# Patient Record
Sex: Female | Born: 1965 | Race: White | Hispanic: No | Marital: Married | Smoking: Never smoker
Health system: Southern US, Community
[De-identification: ages and names within clinical notes are randomized; demographics above are authoritative.]

## PROBLEM LIST (undated history)

## (undated) ENCOUNTER — Inpatient Hospital Stay (HOSPITAL_COMMUNITY): Payer: Self-pay

## (undated) DIAGNOSIS — IMO0002 Reserved for concepts with insufficient information to code with codable children: Secondary | ICD-10-CM

## (undated) DIAGNOSIS — I471 Supraventricular tachycardia, unspecified: Secondary | ICD-10-CM

## (undated) DIAGNOSIS — R0609 Other forms of dyspnea: Secondary | ICD-10-CM

## (undated) DIAGNOSIS — E785 Hyperlipidemia, unspecified: Secondary | ICD-10-CM

## (undated) DIAGNOSIS — I10 Essential (primary) hypertension: Secondary | ICD-10-CM

## (undated) DIAGNOSIS — N739 Female pelvic inflammatory disease, unspecified: Secondary | ICD-10-CM

## (undated) DIAGNOSIS — D869 Sarcoidosis, unspecified: Secondary | ICD-10-CM

## (undated) DIAGNOSIS — O24419 Gestational diabetes mellitus in pregnancy, unspecified control: Secondary | ICD-10-CM

## (undated) DIAGNOSIS — N390 Urinary tract infection, site not specified: Secondary | ICD-10-CM

## (undated) DIAGNOSIS — R87619 Unspecified abnormal cytological findings in specimens from cervix uteri: Secondary | ICD-10-CM

## (undated) HISTORY — DX: Other forms of dyspnea: R06.09

## (undated) HISTORY — PX: INDUCED ABORTION: SHX677

## (undated) HISTORY — PX: APPENDECTOMY: SHX54

## (undated) HISTORY — DX: Supraventricular tachycardia, unspecified: I47.10

---

## 1998-05-23 ENCOUNTER — Encounter: Admission: RE | Admit: 1998-05-23 | Discharge: 1998-05-23 | Payer: Self-pay | Admitting: Family Medicine

## 1998-06-20 ENCOUNTER — Encounter: Admission: RE | Admit: 1998-06-20 | Discharge: 1998-06-20 | Payer: Self-pay | Admitting: Family Medicine

## 1998-08-09 ENCOUNTER — Inpatient Hospital Stay (HOSPITAL_COMMUNITY): Admission: AD | Admit: 1998-08-09 | Discharge: 1998-08-09 | Payer: Self-pay | Admitting: *Deleted

## 1998-08-10 ENCOUNTER — Ambulatory Visit (HOSPITAL_COMMUNITY): Admission: RE | Admit: 1998-08-10 | Discharge: 1998-08-10 | Payer: Self-pay | Admitting: *Deleted

## 1998-10-23 ENCOUNTER — Ambulatory Visit (HOSPITAL_COMMUNITY): Admission: RE | Admit: 1998-10-23 | Discharge: 1998-10-23 | Payer: Self-pay | Admitting: Obstetrics & Gynecology

## 1998-12-26 ENCOUNTER — Inpatient Hospital Stay (HOSPITAL_COMMUNITY): Admission: AD | Admit: 1998-12-26 | Discharge: 1998-12-26 | Payer: Self-pay | Admitting: *Deleted

## 1998-12-27 ENCOUNTER — Inpatient Hospital Stay (HOSPITAL_COMMUNITY): Admission: AD | Admit: 1998-12-27 | Discharge: 1998-12-27 | Payer: Self-pay | Admitting: Obstetrics

## 1999-01-01 ENCOUNTER — Inpatient Hospital Stay (HOSPITAL_COMMUNITY): Admission: AD | Admit: 1999-01-01 | Discharge: 1999-01-01 | Payer: Self-pay | Admitting: Obstetrics

## 1999-01-03 ENCOUNTER — Inpatient Hospital Stay (HOSPITAL_COMMUNITY): Admission: AD | Admit: 1999-01-03 | Discharge: 1999-01-03 | Payer: Self-pay | Admitting: *Deleted

## 1999-01-16 ENCOUNTER — Ambulatory Visit (HOSPITAL_COMMUNITY): Admission: RE | Admit: 1999-01-16 | Discharge: 1999-01-16 | Payer: Self-pay | Admitting: *Deleted

## 1999-01-22 ENCOUNTER — Inpatient Hospital Stay (HOSPITAL_COMMUNITY): Admission: AD | Admit: 1999-01-22 | Discharge: 1999-01-22 | Payer: Self-pay | Admitting: Obstetrics

## 1999-01-23 ENCOUNTER — Inpatient Hospital Stay (HOSPITAL_COMMUNITY): Admission: AD | Admit: 1999-01-23 | Discharge: 1999-01-23 | Payer: Self-pay | Admitting: Obstetrics & Gynecology

## 1999-01-25 ENCOUNTER — Observation Stay (HOSPITAL_COMMUNITY): Admission: AD | Admit: 1999-01-25 | Discharge: 1999-01-25 | Payer: Self-pay | Admitting: Obstetrics & Gynecology

## 1999-01-29 ENCOUNTER — Inpatient Hospital Stay (HOSPITAL_COMMUNITY): Admission: AD | Admit: 1999-01-29 | Discharge: 1999-01-29 | Payer: Self-pay | Admitting: *Deleted

## 1999-02-03 ENCOUNTER — Inpatient Hospital Stay (HOSPITAL_COMMUNITY): Admission: AD | Admit: 1999-02-03 | Discharge: 1999-02-05 | Payer: Self-pay | Admitting: Obstetrics

## 1999-03-04 ENCOUNTER — Inpatient Hospital Stay (HOSPITAL_COMMUNITY): Admission: AD | Admit: 1999-03-04 | Discharge: 1999-03-04 | Payer: Self-pay | Admitting: *Deleted

## 1999-04-08 ENCOUNTER — Encounter: Admission: RE | Admit: 1999-04-08 | Discharge: 1999-04-08 | Payer: Self-pay | Admitting: Family Medicine

## 1999-04-10 ENCOUNTER — Encounter: Admission: RE | Admit: 1999-04-10 | Discharge: 1999-04-10 | Payer: Self-pay | Admitting: Family Medicine

## 1999-04-24 ENCOUNTER — Encounter: Admission: RE | Admit: 1999-04-24 | Discharge: 1999-04-24 | Payer: Self-pay | Admitting: Family Medicine

## 1999-06-02 ENCOUNTER — Encounter: Admission: RE | Admit: 1999-06-02 | Discharge: 1999-06-02 | Payer: Self-pay | Admitting: Family Medicine

## 1999-08-21 ENCOUNTER — Encounter: Admission: RE | Admit: 1999-08-21 | Discharge: 1999-08-21 | Payer: Self-pay | Admitting: Family Medicine

## 2000-03-30 ENCOUNTER — Encounter: Admission: RE | Admit: 2000-03-30 | Discharge: 2000-03-30 | Payer: Self-pay | Admitting: Family Medicine

## 2000-04-20 ENCOUNTER — Encounter: Admission: RE | Admit: 2000-04-20 | Discharge: 2000-04-20 | Payer: Self-pay | Admitting: Family Medicine

## 2000-05-07 ENCOUNTER — Ambulatory Visit (HOSPITAL_COMMUNITY): Admission: RE | Admit: 2000-05-07 | Discharge: 2000-05-07 | Payer: Self-pay | Admitting: *Deleted

## 2000-06-08 ENCOUNTER — Ambulatory Visit (HOSPITAL_COMMUNITY): Admission: RE | Admit: 2000-06-08 | Discharge: 2000-06-08 | Payer: Self-pay | Admitting: *Deleted

## 2000-07-05 ENCOUNTER — Inpatient Hospital Stay (HOSPITAL_COMMUNITY): Admission: AD | Admit: 2000-07-05 | Discharge: 2000-07-05 | Payer: Self-pay | Admitting: Obstetrics

## 2000-07-19 ENCOUNTER — Ambulatory Visit (HOSPITAL_COMMUNITY): Admission: RE | Admit: 2000-07-19 | Discharge: 2000-07-19 | Payer: Self-pay | Admitting: *Deleted

## 2000-08-05 ENCOUNTER — Inpatient Hospital Stay (HOSPITAL_COMMUNITY): Admission: AD | Admit: 2000-08-05 | Discharge: 2000-08-05 | Payer: Self-pay | Admitting: *Deleted

## 2000-08-07 ENCOUNTER — Observation Stay (HOSPITAL_COMMUNITY): Admission: AD | Admit: 2000-08-07 | Discharge: 2000-08-08 | Payer: Self-pay | Admitting: *Deleted

## 2000-08-12 ENCOUNTER — Inpatient Hospital Stay (HOSPITAL_COMMUNITY): Admission: AD | Admit: 2000-08-12 | Discharge: 2000-08-12 | Payer: Self-pay | Admitting: Obstetrics & Gynecology

## 2000-08-25 ENCOUNTER — Inpatient Hospital Stay (HOSPITAL_COMMUNITY): Admission: AD | Admit: 2000-08-25 | Discharge: 2000-08-25 | Payer: Self-pay | Admitting: Obstetrics

## 2000-09-01 ENCOUNTER — Encounter: Payer: Self-pay | Admitting: Obstetrics

## 2000-09-01 ENCOUNTER — Inpatient Hospital Stay (HOSPITAL_COMMUNITY): Admission: AD | Admit: 2000-09-01 | Discharge: 2000-09-01 | Payer: Self-pay | Admitting: Obstetrics

## 2000-09-15 ENCOUNTER — Inpatient Hospital Stay (HOSPITAL_COMMUNITY): Admission: AD | Admit: 2000-09-15 | Discharge: 2000-09-15 | Payer: Self-pay | Admitting: Obstetrics

## 2000-09-17 ENCOUNTER — Inpatient Hospital Stay (HOSPITAL_COMMUNITY): Admission: AD | Admit: 2000-09-17 | Discharge: 2000-09-20 | Payer: Self-pay | Admitting: Obstetrics & Gynecology

## 2000-09-22 ENCOUNTER — Inpatient Hospital Stay (HOSPITAL_COMMUNITY): Admission: AD | Admit: 2000-09-22 | Discharge: 2000-09-22 | Payer: Self-pay | Admitting: Obstetrics

## 2000-10-05 ENCOUNTER — Inpatient Hospital Stay (HOSPITAL_COMMUNITY): Admission: AD | Admit: 2000-10-05 | Discharge: 2000-10-05 | Payer: Self-pay | Admitting: *Deleted

## 2000-10-18 ENCOUNTER — Inpatient Hospital Stay (HOSPITAL_COMMUNITY): Admission: AD | Admit: 2000-10-18 | Discharge: 2000-10-18 | Payer: Self-pay | Admitting: Obstetrics

## 2000-10-21 ENCOUNTER — Inpatient Hospital Stay (HOSPITAL_COMMUNITY): Admission: AD | Admit: 2000-10-21 | Discharge: 2000-10-23 | Payer: Self-pay | Admitting: Obstetrics & Gynecology

## 2000-11-30 ENCOUNTER — Encounter: Admission: RE | Admit: 2000-11-30 | Discharge: 2000-11-30 | Payer: Self-pay | Admitting: Sports Medicine

## 2000-12-23 ENCOUNTER — Encounter: Admission: RE | Admit: 2000-12-23 | Discharge: 2000-12-23 | Payer: Self-pay | Admitting: Family Medicine

## 2001-05-03 ENCOUNTER — Encounter: Admission: RE | Admit: 2001-05-03 | Discharge: 2001-05-03 | Payer: Self-pay | Admitting: Family Medicine

## 2001-05-09 ENCOUNTER — Encounter: Admission: RE | Admit: 2001-05-09 | Discharge: 2001-05-09 | Payer: Self-pay | Admitting: Family Medicine

## 2001-09-20 ENCOUNTER — Encounter: Admission: RE | Admit: 2001-09-20 | Discharge: 2001-09-20 | Payer: Self-pay | Admitting: Family Medicine

## 2002-02-15 ENCOUNTER — Encounter: Admission: RE | Admit: 2002-02-15 | Discharge: 2002-02-15 | Payer: Self-pay | Admitting: Family Medicine

## 2002-05-02 ENCOUNTER — Encounter: Admission: RE | Admit: 2002-05-02 | Discharge: 2002-05-02 | Payer: Self-pay | Admitting: Family Medicine

## 2002-11-21 ENCOUNTER — Encounter: Admission: RE | Admit: 2002-11-21 | Discharge: 2002-11-21 | Payer: Self-pay | Admitting: Family Medicine

## 2002-11-28 ENCOUNTER — Encounter: Admission: RE | Admit: 2002-11-28 | Discharge: 2002-11-28 | Payer: Self-pay | Admitting: Family Medicine

## 2002-12-01 ENCOUNTER — Encounter: Admission: RE | Admit: 2002-12-01 | Discharge: 2002-12-01 | Payer: Self-pay | Admitting: Family Medicine

## 2002-12-07 ENCOUNTER — Encounter: Admission: RE | Admit: 2002-12-07 | Discharge: 2002-12-07 | Payer: Self-pay | Admitting: Family Medicine

## 2003-01-02 ENCOUNTER — Encounter (INDEPENDENT_AMBULATORY_CARE_PROVIDER_SITE_OTHER): Payer: Self-pay | Admitting: *Deleted

## 2003-01-02 LAB — CONVERTED CEMR LAB

## 2003-01-23 ENCOUNTER — Encounter: Admission: RE | Admit: 2003-01-23 | Discharge: 2003-01-23 | Payer: Self-pay | Admitting: Family Medicine

## 2003-01-26 ENCOUNTER — Encounter: Admission: RE | Admit: 2003-01-26 | Discharge: 2003-01-26 | Payer: Self-pay | Admitting: Family Medicine

## 2003-02-11 ENCOUNTER — Encounter: Payer: Self-pay | Admitting: Emergency Medicine

## 2003-02-12 ENCOUNTER — Encounter: Payer: Self-pay | Admitting: Emergency Medicine

## 2003-02-12 ENCOUNTER — Observation Stay (HOSPITAL_COMMUNITY): Admission: EM | Admit: 2003-02-12 | Discharge: 2003-02-12 | Payer: Self-pay | Admitting: Emergency Medicine

## 2003-03-01 ENCOUNTER — Encounter: Admission: RE | Admit: 2003-03-01 | Discharge: 2003-03-01 | Payer: Self-pay | Admitting: Family Medicine

## 2003-03-02 ENCOUNTER — Encounter: Admission: RE | Admit: 2003-03-02 | Discharge: 2003-03-02 | Payer: Self-pay | Admitting: Family Medicine

## 2003-03-02 ENCOUNTER — Encounter: Payer: Self-pay | Admitting: Family Medicine

## 2003-05-17 ENCOUNTER — Encounter: Admission: RE | Admit: 2003-05-17 | Discharge: 2003-05-17 | Payer: Self-pay | Admitting: Family Medicine

## 2003-07-30 ENCOUNTER — Encounter: Admission: RE | Admit: 2003-07-30 | Discharge: 2003-07-30 | Payer: Self-pay | Admitting: Family Medicine

## 2003-08-02 ENCOUNTER — Encounter: Admission: RE | Admit: 2003-08-02 | Discharge: 2003-08-02 | Payer: Self-pay | Admitting: Sports Medicine

## 2003-10-09 ENCOUNTER — Encounter: Admission: RE | Admit: 2003-10-09 | Discharge: 2003-10-09 | Payer: Self-pay | Admitting: Family Medicine

## 2003-10-12 ENCOUNTER — Encounter: Admission: RE | Admit: 2003-10-12 | Discharge: 2003-10-12 | Payer: Self-pay | Admitting: Family Medicine

## 2003-10-12 ENCOUNTER — Ambulatory Visit (HOSPITAL_COMMUNITY): Admission: RE | Admit: 2003-10-12 | Discharge: 2003-10-12 | Payer: Self-pay | Admitting: Family Medicine

## 2003-10-23 ENCOUNTER — Encounter: Admission: RE | Admit: 2003-10-23 | Discharge: 2003-10-23 | Payer: Self-pay | Admitting: Sports Medicine

## 2003-11-06 ENCOUNTER — Encounter: Admission: RE | Admit: 2003-11-06 | Discharge: 2003-11-06 | Payer: Self-pay | Admitting: Family Medicine

## 2004-01-16 ENCOUNTER — Encounter: Admission: RE | Admit: 2004-01-16 | Discharge: 2004-01-16 | Payer: Self-pay | Admitting: Family Medicine

## 2004-01-17 ENCOUNTER — Emergency Department (HOSPITAL_COMMUNITY): Admission: EM | Admit: 2004-01-17 | Discharge: 2004-01-17 | Payer: Self-pay | Admitting: Emergency Medicine

## 2004-01-22 ENCOUNTER — Encounter: Admission: RE | Admit: 2004-01-22 | Discharge: 2004-01-22 | Payer: Self-pay | Admitting: Family Medicine

## 2004-02-07 ENCOUNTER — Encounter: Admission: RE | Admit: 2004-02-07 | Discharge: 2004-02-07 | Payer: Self-pay | Admitting: Family Medicine

## 2004-02-11 ENCOUNTER — Emergency Department (HOSPITAL_COMMUNITY): Admission: EM | Admit: 2004-02-11 | Discharge: 2004-02-12 | Payer: Self-pay

## 2004-02-12 ENCOUNTER — Encounter: Admission: RE | Admit: 2004-02-12 | Discharge: 2004-02-12 | Payer: Self-pay | Admitting: Family Medicine

## 2004-04-02 ENCOUNTER — Encounter: Admission: RE | Admit: 2004-04-02 | Discharge: 2004-04-02 | Payer: Self-pay | Admitting: Sports Medicine

## 2004-04-08 ENCOUNTER — Encounter: Admission: RE | Admit: 2004-04-08 | Discharge: 2004-04-08 | Payer: Self-pay | Admitting: Family Medicine

## 2004-04-15 ENCOUNTER — Encounter: Admission: RE | Admit: 2004-04-15 | Discharge: 2004-04-15 | Payer: Self-pay | Admitting: Family Medicine

## 2004-04-28 ENCOUNTER — Encounter: Admission: RE | Admit: 2004-04-28 | Discharge: 2004-04-28 | Payer: Self-pay | Admitting: Family Medicine

## 2004-05-02 ENCOUNTER — Ambulatory Visit: Payer: Self-pay | Admitting: Family Medicine

## 2004-05-13 ENCOUNTER — Ambulatory Visit: Payer: Self-pay | Admitting: Family Medicine

## 2004-05-21 ENCOUNTER — Ambulatory Visit: Payer: Self-pay | Admitting: Family Medicine

## 2004-05-23 ENCOUNTER — Ambulatory Visit: Payer: Self-pay | Admitting: *Deleted

## 2004-05-23 ENCOUNTER — Inpatient Hospital Stay (HOSPITAL_COMMUNITY): Admission: AD | Admit: 2004-05-23 | Discharge: 2004-05-23 | Payer: Self-pay | Admitting: Obstetrics & Gynecology

## 2004-05-26 ENCOUNTER — Ambulatory Visit: Payer: Self-pay | Admitting: Family Medicine

## 2004-05-30 ENCOUNTER — Ambulatory Visit: Payer: Self-pay | Admitting: Family Medicine

## 2004-06-03 ENCOUNTER — Ambulatory Visit (HOSPITAL_COMMUNITY): Admission: RE | Admit: 2004-06-03 | Discharge: 2004-06-03 | Payer: Self-pay | Admitting: Family Medicine

## 2004-06-04 ENCOUNTER — Emergency Department (HOSPITAL_COMMUNITY): Admission: EM | Admit: 2004-06-04 | Discharge: 2004-06-05 | Payer: Self-pay | Admitting: Emergency Medicine

## 2004-06-05 ENCOUNTER — Ambulatory Visit: Payer: Self-pay | Admitting: Family Medicine

## 2004-06-20 ENCOUNTER — Ambulatory Visit (HOSPITAL_COMMUNITY): Admission: RE | Admit: 2004-06-20 | Discharge: 2004-06-20 | Payer: Self-pay | Admitting: Family Medicine

## 2004-06-20 ENCOUNTER — Ambulatory Visit: Payer: Self-pay | Admitting: Sports Medicine

## 2004-06-27 ENCOUNTER — Inpatient Hospital Stay (HOSPITAL_COMMUNITY): Admission: AD | Admit: 2004-06-27 | Discharge: 2004-06-27 | Payer: Self-pay | Admitting: Obstetrics & Gynecology

## 2004-06-30 ENCOUNTER — Inpatient Hospital Stay (HOSPITAL_COMMUNITY): Admission: AD | Admit: 2004-06-30 | Discharge: 2004-06-30 | Payer: Self-pay | Admitting: Obstetrics and Gynecology

## 2004-07-02 ENCOUNTER — Observation Stay: Payer: Self-pay

## 2004-07-03 ENCOUNTER — Inpatient Hospital Stay (HOSPITAL_COMMUNITY): Admission: AD | Admit: 2004-07-03 | Discharge: 2004-07-03 | Payer: Self-pay | Admitting: Obstetrics and Gynecology

## 2004-07-07 ENCOUNTER — Ambulatory Visit: Payer: Self-pay | Admitting: Sports Medicine

## 2004-07-22 ENCOUNTER — Ambulatory Visit: Payer: Self-pay | Admitting: Family Medicine

## 2004-07-29 ENCOUNTER — Ambulatory Visit: Payer: Self-pay | Admitting: Family Medicine

## 2004-07-29 ENCOUNTER — Ambulatory Visit (HOSPITAL_COMMUNITY): Admission: RE | Admit: 2004-07-29 | Discharge: 2004-07-29 | Payer: Self-pay | Admitting: Family Medicine

## 2004-08-04 ENCOUNTER — Inpatient Hospital Stay (HOSPITAL_COMMUNITY): Admission: AD | Admit: 2004-08-04 | Discharge: 2004-08-04 | Payer: Self-pay | Admitting: Obstetrics & Gynecology

## 2004-08-08 ENCOUNTER — Ambulatory Visit: Payer: Self-pay | Admitting: Sports Medicine

## 2004-08-08 ENCOUNTER — Inpatient Hospital Stay (HOSPITAL_COMMUNITY): Admission: AD | Admit: 2004-08-08 | Discharge: 2004-08-08 | Payer: Self-pay | Admitting: Obstetrics & Gynecology

## 2004-08-11 ENCOUNTER — Ambulatory Visit: Payer: Self-pay | Admitting: Family Medicine

## 2004-08-12 ENCOUNTER — Ambulatory Visit: Payer: Self-pay | Admitting: Family Medicine

## 2004-08-14 ENCOUNTER — Observation Stay: Payer: Self-pay

## 2004-08-21 ENCOUNTER — Ambulatory Visit: Payer: Self-pay | Admitting: Family Medicine

## 2004-08-27 ENCOUNTER — Ambulatory Visit: Payer: Self-pay | Admitting: Sports Medicine

## 2004-09-03 ENCOUNTER — Inpatient Hospital Stay (HOSPITAL_COMMUNITY): Admission: AD | Admit: 2004-09-03 | Discharge: 2004-09-03 | Payer: Self-pay | Admitting: Obstetrics and Gynecology

## 2004-09-10 ENCOUNTER — Ambulatory Visit: Payer: Self-pay | Admitting: Family Medicine

## 2004-09-25 ENCOUNTER — Ambulatory Visit: Payer: Self-pay | Admitting: Sports Medicine

## 2004-09-29 ENCOUNTER — Observation Stay: Payer: Self-pay | Admitting: Obstetrics and Gynecology

## 2004-10-03 ENCOUNTER — Ambulatory Visit: Payer: Self-pay | Admitting: Obstetrics & Gynecology

## 2004-10-03 ENCOUNTER — Observation Stay (HOSPITAL_COMMUNITY): Admission: AD | Admit: 2004-10-03 | Discharge: 2004-10-04 | Payer: Self-pay | Admitting: Obstetrics & Gynecology

## 2004-10-13 ENCOUNTER — Ambulatory Visit (HOSPITAL_COMMUNITY): Admission: RE | Admit: 2004-10-13 | Discharge: 2004-10-13 | Payer: Self-pay | Admitting: Sports Medicine

## 2004-10-13 ENCOUNTER — Ambulatory Visit: Payer: Self-pay | Admitting: Sports Medicine

## 2004-10-21 ENCOUNTER — Ambulatory Visit: Payer: Self-pay | Admitting: Family Medicine

## 2004-10-25 ENCOUNTER — Inpatient Hospital Stay (HOSPITAL_COMMUNITY): Admission: AD | Admit: 2004-10-25 | Discharge: 2004-10-26 | Payer: Self-pay | Admitting: *Deleted

## 2004-10-28 ENCOUNTER — Ambulatory Visit: Payer: Self-pay | Admitting: Family Medicine

## 2004-10-30 ENCOUNTER — Observation Stay: Payer: Self-pay | Admitting: Obstetrics and Gynecology

## 2004-11-05 ENCOUNTER — Inpatient Hospital Stay (HOSPITAL_COMMUNITY): Admission: AD | Admit: 2004-11-05 | Discharge: 2004-11-05 | Payer: Self-pay | Admitting: Obstetrics and Gynecology

## 2004-11-06 ENCOUNTER — Ambulatory Visit: Payer: Self-pay | Admitting: Sports Medicine

## 2004-11-10 ENCOUNTER — Observation Stay: Payer: Self-pay | Admitting: Obstetrics and Gynecology

## 2004-11-11 ENCOUNTER — Inpatient Hospital Stay (HOSPITAL_COMMUNITY): Admission: AD | Admit: 2004-11-11 | Discharge: 2004-11-12 | Payer: Self-pay | Admitting: *Deleted

## 2004-11-13 ENCOUNTER — Ambulatory Visit: Payer: Self-pay | Admitting: Family Medicine

## 2004-11-14 ENCOUNTER — Inpatient Hospital Stay (HOSPITAL_COMMUNITY): Admission: AD | Admit: 2004-11-14 | Discharge: 2004-11-15 | Payer: Self-pay | Admitting: Obstetrics & Gynecology

## 2004-11-18 ENCOUNTER — Ambulatory Visit: Payer: Self-pay | Admitting: *Deleted

## 2004-11-19 ENCOUNTER — Inpatient Hospital Stay (HOSPITAL_COMMUNITY): Admission: AD | Admit: 2004-11-19 | Discharge: 2004-11-19 | Payer: Self-pay | Admitting: Obstetrics and Gynecology

## 2004-11-21 ENCOUNTER — Ambulatory Visit: Payer: Self-pay | Admitting: Family Medicine

## 2004-11-24 ENCOUNTER — Inpatient Hospital Stay (HOSPITAL_COMMUNITY): Admission: AD | Admit: 2004-11-24 | Discharge: 2004-11-26 | Payer: Self-pay | Admitting: Family Medicine

## 2004-11-27 ENCOUNTER — Ambulatory Visit: Payer: Self-pay | Admitting: Certified Nurse Midwife

## 2004-11-27 ENCOUNTER — Inpatient Hospital Stay (HOSPITAL_COMMUNITY): Admission: AD | Admit: 2004-11-27 | Discharge: 2004-11-29 | Payer: Self-pay | Admitting: Obstetrics & Gynecology

## 2004-12-11 ENCOUNTER — Ambulatory Visit: Payer: Self-pay | Admitting: Sports Medicine

## 2005-01-12 ENCOUNTER — Ambulatory Visit: Payer: Self-pay | Admitting: Family Medicine

## 2005-01-19 ENCOUNTER — Ambulatory Visit: Payer: Self-pay | Admitting: Family Medicine

## 2005-02-02 ENCOUNTER — Emergency Department: Payer: Self-pay | Admitting: Emergency Medicine

## 2005-02-12 ENCOUNTER — Other Ambulatory Visit: Payer: Self-pay

## 2005-02-12 ENCOUNTER — Emergency Department: Payer: Self-pay | Admitting: Emergency Medicine

## 2005-02-17 ENCOUNTER — Ambulatory Visit: Payer: Self-pay | Admitting: Emergency Medicine

## 2005-02-19 ENCOUNTER — Ambulatory Visit: Payer: Self-pay | Admitting: Family Medicine

## 2005-02-26 ENCOUNTER — Ambulatory Visit: Payer: Self-pay | Admitting: Family Medicine

## 2005-02-26 ENCOUNTER — Encounter: Admission: RE | Admit: 2005-02-26 | Discharge: 2005-02-26 | Payer: Self-pay | Admitting: Family Medicine

## 2005-03-18 ENCOUNTER — Emergency Department: Payer: Self-pay | Admitting: Emergency Medicine

## 2005-03-26 ENCOUNTER — Ambulatory Visit: Payer: Self-pay | Admitting: Family Medicine

## 2005-04-01 ENCOUNTER — Emergency Department: Payer: Self-pay | Admitting: Emergency Medicine

## 2005-04-22 ENCOUNTER — Ambulatory Visit: Payer: Self-pay | Admitting: Family Medicine

## 2005-05-11 ENCOUNTER — Emergency Department: Payer: Self-pay | Admitting: Emergency Medicine

## 2005-05-14 ENCOUNTER — Encounter: Admission: RE | Admit: 2005-05-14 | Discharge: 2005-05-14 | Payer: Self-pay | Admitting: Family Medicine

## 2005-05-14 ENCOUNTER — Ambulatory Visit: Payer: Self-pay | Admitting: Family Medicine

## 2005-06-16 ENCOUNTER — Ambulatory Visit: Payer: Self-pay | Admitting: Family Medicine

## 2005-10-05 ENCOUNTER — Emergency Department (HOSPITAL_COMMUNITY): Admission: EM | Admit: 2005-10-05 | Discharge: 2005-10-05 | Payer: Self-pay | Admitting: Emergency Medicine

## 2005-10-08 ENCOUNTER — Ambulatory Visit: Payer: Self-pay | Admitting: Family Medicine

## 2005-11-16 ENCOUNTER — Emergency Department: Payer: Self-pay | Admitting: Emergency Medicine

## 2005-11-17 ENCOUNTER — Encounter (INDEPENDENT_AMBULATORY_CARE_PROVIDER_SITE_OTHER): Payer: Self-pay | Admitting: Specialist

## 2005-11-17 ENCOUNTER — Ambulatory Visit: Payer: Self-pay | Admitting: Family Medicine

## 2006-05-12 ENCOUNTER — Emergency Department: Payer: Self-pay | Admitting: General Practice

## 2006-07-26 ENCOUNTER — Emergency Department: Payer: Self-pay | Admitting: Emergency Medicine

## 2006-10-28 DIAGNOSIS — R002 Palpitations: Secondary | ICD-10-CM | POA: Insufficient documentation

## 2006-10-28 DIAGNOSIS — E7800 Pure hypercholesterolemia, unspecified: Secondary | ICD-10-CM | POA: Insufficient documentation

## 2006-10-28 DIAGNOSIS — F410 Panic disorder [episodic paroxysmal anxiety] without agoraphobia: Secondary | ICD-10-CM | POA: Insufficient documentation

## 2006-10-29 ENCOUNTER — Encounter (INDEPENDENT_AMBULATORY_CARE_PROVIDER_SITE_OTHER): Payer: Self-pay | Admitting: *Deleted

## 2007-01-13 ENCOUNTER — Emergency Department: Payer: Self-pay | Admitting: Emergency Medicine

## 2007-07-22 ENCOUNTER — Emergency Department: Payer: Self-pay | Admitting: Internal Medicine

## 2007-07-25 ENCOUNTER — Emergency Department (HOSPITAL_COMMUNITY): Admission: EM | Admit: 2007-07-25 | Discharge: 2007-07-25 | Payer: Self-pay | Admitting: Emergency Medicine

## 2007-11-02 ENCOUNTER — Other Ambulatory Visit: Payer: Self-pay

## 2007-11-02 ENCOUNTER — Emergency Department: Payer: Self-pay | Admitting: Emergency Medicine

## 2008-01-03 ENCOUNTER — Emergency Department (HOSPITAL_COMMUNITY): Admission: EM | Admit: 2008-01-03 | Discharge: 2008-01-03 | Payer: Self-pay | Admitting: Family Medicine

## 2008-04-03 ENCOUNTER — Emergency Department (HOSPITAL_COMMUNITY): Admission: EM | Admit: 2008-04-03 | Discharge: 2008-04-03 | Payer: Self-pay | Admitting: Emergency Medicine

## 2008-04-12 ENCOUNTER — Emergency Department: Payer: Self-pay | Admitting: Emergency Medicine

## 2008-05-14 ENCOUNTER — Emergency Department: Payer: Self-pay | Admitting: Emergency Medicine

## 2008-05-14 ENCOUNTER — Other Ambulatory Visit: Payer: Self-pay

## 2008-05-28 ENCOUNTER — Telehealth: Payer: Self-pay | Admitting: *Deleted

## 2008-06-15 ENCOUNTER — Encounter: Payer: Self-pay | Admitting: Family Medicine

## 2008-07-05 ENCOUNTER — Telehealth: Payer: Self-pay | Admitting: Family Medicine

## 2008-07-09 ENCOUNTER — Ambulatory Visit: Payer: Self-pay | Admitting: Family Medicine

## 2008-07-11 ENCOUNTER — Emergency Department (HOSPITAL_COMMUNITY): Admission: EM | Admit: 2008-07-11 | Discharge: 2008-07-11 | Payer: Self-pay | Admitting: Emergency Medicine

## 2008-09-27 ENCOUNTER — Telehealth: Payer: Self-pay | Admitting: Family Medicine

## 2008-10-31 ENCOUNTER — Emergency Department: Payer: Self-pay | Admitting: Emergency Medicine

## 2008-12-19 ENCOUNTER — Emergency Department: Payer: Self-pay | Admitting: Emergency Medicine

## 2009-03-21 ENCOUNTER — Encounter: Payer: Self-pay | Admitting: Family Medicine

## 2009-03-21 ENCOUNTER — Ambulatory Visit: Payer: Self-pay | Admitting: Family Medicine

## 2009-03-21 ENCOUNTER — Other Ambulatory Visit: Admission: RE | Admit: 2009-03-21 | Discharge: 2009-03-21 | Payer: Self-pay | Admitting: Family Medicine

## 2009-03-21 DIAGNOSIS — N912 Amenorrhea, unspecified: Secondary | ICD-10-CM | POA: Insufficient documentation

## 2009-03-21 DIAGNOSIS — K219 Gastro-esophageal reflux disease without esophagitis: Secondary | ICD-10-CM | POA: Insufficient documentation

## 2009-03-22 ENCOUNTER — Encounter: Payer: Self-pay | Admitting: *Deleted

## 2009-03-25 ENCOUNTER — Ambulatory Visit (HOSPITAL_COMMUNITY): Admission: RE | Admit: 2009-03-25 | Discharge: 2009-03-25 | Payer: Self-pay | Admitting: Family Medicine

## 2009-03-25 ENCOUNTER — Encounter: Payer: Self-pay | Admitting: Family Medicine

## 2009-03-25 ENCOUNTER — Ambulatory Visit: Payer: Self-pay | Admitting: Family Medicine

## 2009-03-25 LAB — CONVERTED CEMR LAB
HDL: 31 mg/dL — ABNORMAL LOW (ref >39)
LDL Cholesterol: 245 mg/dL — ABNORMAL HIGH (ref 0–99)
TSH: 1.986 microintl units/mL (ref 0.350–4.500)
Triglycerides: 121 mg/dL (ref <150)
VLDL: 24 mg/dL (ref 0–40)

## 2009-04-04 ENCOUNTER — Encounter (INDEPENDENT_AMBULATORY_CARE_PROVIDER_SITE_OTHER): Payer: Self-pay | Admitting: *Deleted

## 2009-04-16 ENCOUNTER — Encounter: Payer: Self-pay | Admitting: Family Medicine

## 2009-04-16 ENCOUNTER — Emergency Department (HOSPITAL_COMMUNITY): Admission: EM | Admit: 2009-04-16 | Discharge: 2009-04-16 | Payer: Self-pay | Admitting: Emergency Medicine

## 2009-04-16 ENCOUNTER — Ambulatory Visit: Payer: Self-pay | Admitting: Family Medicine

## 2009-04-16 DIAGNOSIS — R87610 Atypical squamous cells of undetermined significance on cytologic smear of cervix (ASC-US): Secondary | ICD-10-CM | POA: Insufficient documentation

## 2009-11-11 ENCOUNTER — Emergency Department (HOSPITAL_COMMUNITY): Admission: EM | Admit: 2009-11-11 | Discharge: 2009-11-11 | Payer: Self-pay | Admitting: Emergency Medicine

## 2009-11-25 ENCOUNTER — Ambulatory Visit: Payer: Self-pay | Admitting: Family Medicine

## 2009-11-26 ENCOUNTER — Encounter: Payer: Self-pay | Admitting: Family Medicine

## 2009-12-13 ENCOUNTER — Ambulatory Visit (HOSPITAL_COMMUNITY): Admission: RE | Admit: 2009-12-13 | Discharge: 2009-12-13 | Payer: Self-pay | Admitting: Family Medicine

## 2009-12-13 ENCOUNTER — Encounter: Payer: Self-pay | Admitting: Family Medicine

## 2009-12-13 ENCOUNTER — Ambulatory Visit: Payer: Self-pay | Admitting: Family Medicine

## 2009-12-13 LAB — CONVERTED CEMR LAB
BUN: 12 mg/dL (ref 6–23)
CO2: 26 meq/L (ref 19–32)
Calcium: 9.2 mg/dL (ref 8.4–10.5)
Chloride: 105 meq/L (ref 96–112)
Creatinine, Ser: 0.67 mg/dL (ref 0.40–1.20)
Glucose, Bld: 120 mg/dL — ABNORMAL HIGH (ref 70–99)
HCT: 39.6 % (ref 36.0–46.0)
Hemoglobin: 13 g/dL (ref 12.0–15.0)
MCHC: 32.8 g/dL (ref 30.0–36.0)
MCV: 90.6 fL (ref 78.0–100.0)
Platelets: 243 10*3/uL (ref 150–400)
Potassium: 4 meq/L (ref 3.5–5.3)
RBC: 4.37 M/uL (ref 3.87–5.11)
RDW: 12.7 % (ref 11.5–15.5)
Sodium: 141 meq/L (ref 135–145)
TSH: 1.854 microintl units/mL (ref 0.350–4.500)
WBC: 5.8 10*3/uL (ref 4.0–10.5)

## 2009-12-14 ENCOUNTER — Encounter: Payer: Self-pay | Admitting: Family Medicine

## 2009-12-16 ENCOUNTER — Encounter: Payer: Self-pay | Admitting: Family Medicine

## 2009-12-16 ENCOUNTER — Ambulatory Visit: Payer: Self-pay | Admitting: Family Medicine

## 2009-12-17 ENCOUNTER — Ambulatory Visit: Payer: Self-pay | Admitting: Family Medicine

## 2010-01-16 ENCOUNTER — Ambulatory Visit: Payer: Self-pay | Admitting: Family Medicine

## 2010-01-16 DIAGNOSIS — M542 Cervicalgia: Secondary | ICD-10-CM | POA: Insufficient documentation

## 2010-01-20 ENCOUNTER — Encounter: Payer: Self-pay | Admitting: Family Medicine

## 2010-02-10 ENCOUNTER — Telehealth: Payer: Self-pay | Admitting: Family Medicine

## 2010-02-20 ENCOUNTER — Emergency Department (HOSPITAL_COMMUNITY): Admission: EM | Admit: 2010-02-20 | Discharge: 2010-02-20 | Payer: Self-pay | Admitting: Family Medicine

## 2010-02-27 ENCOUNTER — Encounter: Payer: Self-pay | Admitting: Family Medicine

## 2010-03-05 ENCOUNTER — Encounter: Payer: Self-pay | Admitting: *Deleted

## 2010-03-07 ENCOUNTER — Encounter: Payer: Self-pay | Admitting: Family Medicine

## 2010-03-12 ENCOUNTER — Telehealth (INDEPENDENT_AMBULATORY_CARE_PROVIDER_SITE_OTHER): Payer: Self-pay | Admitting: Family Medicine

## 2010-03-20 ENCOUNTER — Ambulatory Visit: Payer: Self-pay | Admitting: Family Medicine

## 2010-03-20 ENCOUNTER — Encounter: Payer: Self-pay | Admitting: Family Medicine

## 2010-05-01 ENCOUNTER — Ambulatory Visit: Payer: Self-pay | Admitting: Family Medicine

## 2010-05-14 ENCOUNTER — Emergency Department: Payer: Self-pay | Admitting: Emergency Medicine

## 2010-05-16 ENCOUNTER — Telehealth: Payer: Self-pay | Admitting: *Deleted

## 2010-07-11 ENCOUNTER — Encounter: Payer: Self-pay | Admitting: Family Medicine

## 2010-07-30 ENCOUNTER — Ambulatory Visit: Payer: Self-pay | Admitting: Family Medicine

## 2010-07-30 ENCOUNTER — Encounter: Payer: Self-pay | Admitting: Family Medicine

## 2010-07-30 LAB — CONVERTED CEMR LAB
Chlamydia, DNA Probe: NEGATIVE
GC Probe Amp, Genital: NEGATIVE
Whiff Test: NEGATIVE

## 2010-09-07 ENCOUNTER — Emergency Department: Payer: Self-pay | Admitting: Emergency Medicine

## 2010-09-16 ENCOUNTER — Encounter: Payer: Self-pay | Admitting: Family Medicine

## 2010-09-22 ENCOUNTER — Ambulatory Visit: Admit: 2010-09-22 | Payer: Self-pay | Admitting: Family Medicine

## 2010-09-30 NOTE — Miscellaneous (Signed)
Summary: Contraceptioin  Clinical Lists Changes  Pt wishes to have another IUD not tubal.  Has mirena in 01/19/05.  Will refer to [facility] clinic Pearlean Brownie MD  March 07, 2010 3:15 PM   Has appointment 7-21       Allergies: 1)  Bactrim (Sulfamethoxazole-Trimethoprim)

## 2010-09-30 NOTE — Progress Notes (Signed)
Summary: Triage  Phone Note Call from Patient Call back at Home Phone [phone]   Reason for Call: Talk to Nurse Summary of Call: pt is concerned about everyone in the home having scabies, pt thinks she & all her kids need an rx Initial call taken by: Knox Royalty,  February 10, 2010 10:45 AM  Follow-up for Phone Call        startes out on "everybody's hand between the fingers, the groin. hx of this a month ago.  very itchy.  has 5 children with symptoms & states it is very hard to get them here as her car only holds 4 people. lives in [place] & wants to take them there as we could not accomodate all 6 people today. states it is much worse at night. told her she could go locally. urged her to houseclean well & do the laundy per instructions or they could come back Follow-up by: Golden Circle RN,  February 10, 2010 10:56 AM

## 2010-09-30 NOTE — Miscellaneous (Signed)
Summary: Consent: IUD removal/placement  Consent: IUD removal/placement   Imported By: Knox Royalty 03/27/2010 16:39:52  _____________________________________________________________________  External Attachment:    Type:   Image     Comment:   External Document

## 2010-09-30 NOTE — Miscellaneous (Signed)
Summary: DNKA at [facility]  Clinical Lists Changes rec'd fax that she did not keep appt 02/26/10 at [facility].Golden Circle RN  February 27, 2010 10:20 AM

## 2010-09-30 NOTE — Assessment & Plan Note (Signed)
Summary: migrain/pelvic,df   Vital Signs:  Patient profile:   45 year old female Height:      64 inches Weight:      192 pounds BMI:     33.08 Temp:     98.1 degrees F oral Pulse rate:   66 / minute BP sitting:   126 / 81  (left arm) Cuff size:   regular  Vitals Entered By: Garen Grams LPN (July 30, 2010 10:01 AM) CC: vaginal d/c and headaches Is Patient Diabetic? No   Primary Care Provider:  Pearlean Brownie MD  CC:  vaginal d/c and headaches.  History of Present Illness: Pelvic Pain having crampy type but nearly constant pain in lower abdominal area sometime into back for the last month.   Feels like a previous PID.   No fever or vomiting.  having a brownish discharge.  No STD known exposure.   Has IUD placed in July.   No dysuria or rash   ROS - as above PMH - Medications reviewed and updated in medication list.  Smoking Status noted in VS form    Habits & Providers  Alcohol-Tobacco-Diet     Tobacco Status: never  Current Medications (verified): 1)  Simvastatin 40 Mg Tabs (Simvastatin) .... Take One Tablet At Bedtime 2)  Ranitidine Hcl 300 Mg Caps (Ranitidine Hcl) .Marland Kitchen.. 1 By Mouth At Bedtime For Heart Burn 3)  Doxycycline Hyclate 100 Mg Caps (Doxycycline Hyclate) .... One Tab By Mouth Two Times A Day 4)  Metronidazole 500 Mg Tabs (Metronidazole) .Marland Kitchen.. 1 By Mouth Two Times A Day  Allergies: 1)  Bactrim (Sulfamethoxazole-Trimethoprim)  Physical Exam  General:  Well-developed,well-nourished,in no acute distress; alert,appropriate and cooperative throughout examination Abdomen:  Bowel sounds positive,abdomen soft and non-tender without masses, organomegaly or hernias noted.  Mild lower suprapubic tenderness  Genitalia:  Normal introitus for age, no external lesions, scant white discharge, uterine os without discharge and strings in place.  Bimanual is tender bilaterally without specific severe CMT.  No masses appreaciated    Impression &  Recommendations:  Problem # 1:  ABDOMINAL PAIN, UNSPECIFIED SITE (ICD-789.00) some of her symptoms are consistent with PID although not an acute severe case.   Will treat with course of antibiotics and observe.  If persists may need an [place]  Orders: Wet Prep- [facility] [phone]) GC/Chlamydia-[facility] (87591/87491) [facility]- Est  Level 4 (60454)  Complete Medication List: 1)  Simvastatin 40 Mg Tabs (Simvastatin) .... Take one tablet at bedtime 2)  Ranitidine Hcl 300 Mg Caps (Ranitidine hcl) .Marland Kitchen.. 1 by mouth at bedtime for heart burn 3)  Doxycycline Hyclate 100 Mg Caps (Doxycycline hyclate) .... One tab by mouth two times a day 4)  Metronidazole 500 Mg Tabs (Metronidazole) .Marland Kitchen.. 1 by mouth two times a day  Patient Instructions: 1)  I think you have a mild PID infection 2)  Take all the antibiotics two times a day for 10 days 3)  If you getting worsening abdominal pain or fever or have or vomiting then come back immediately 4)  If all your symptoms are not gone in 3 weeks then call us Prescriptions: METRONIDAZOLE 500 MG TABS (METRONIDAZOLE) 1 by mouth two times a day  #20 x 0   Entered and Authorized by:   Pearlean Brownie MD   Signed by:   Pearlean Brownie MD on 07/30/2010   Method used:   Electronically to        [facility] [address] [phone].* (retail)  [address]       [place], [place]  [address]       Ph: [phone]       Fax: [phone]   RxID:   [id number] DOXYCYCLINE HYCLATE 100 MG CAPS (DOXYCYCLINE HYCLATE) one tab by mouth two times a day  #20 x 0   Entered and Authorized by:   Pearlean Brownie MD   Signed by:   Pearlean Brownie MD on 07/30/2010   Method used:   Electronically to        [facility] [address] [phone].* (retail)       [address]       [place], [place]  [address]       Ph: [phone]       Fax: [phone]   RxID:   [id number]    Orders Added: 1)  Wet Prep- [facility] [87210] 2)  GC/Chlamydia-[facility]  [87591/87491] 3)  [facility]- Est  Level 4 [70350]    Prevention & Chronic Care Immunizations   Influenza vaccine: Not documented    Tetanus booster: 08/31/1997: Done.    Pneumococcal vaccine: Not documented  Other Screening   Pap smear: in [facility] was normal   (08/31/2009)    Mammogram: Not documented   Smoking status: never  (07/30/2010)  Lipids   Total Cholesterol: 300  (03/25/2009)   LDL: 245  (03/25/2009)   LDL Direct: Not documented   HDL: 31  (03/25/2009)   Triglycerides: 121  (03/25/2009)    SGOT (AST): Not documented   SGPT (ALT): Not documented   Alkaline phosphatase: Not documented   Total bilirubin: Not documented  Self-Management Support :    Lipid self-management support: Not documented   Laboratory Results  Date/Time Received: July 30, 2010 10:25 AM  Date/Time Reported: July 30, 2010 10:50 AM   Wet [place] Source: vag WBC/hpf: 5-10 Bacteria/hpf: 3+  Rods Clue cells/hpf: none  Negative whiff Yeast/hpf: occ Trichomonas/hpf: none Comments: ...............test performed by......Marland KitchenBonnie A. [place], MLS (ASCP)cm

## 2010-09-30 NOTE — Procedures (Signed)
Summary: Holter on 12/17/19  Sinus rhytym with occaisional PVCs PACs and rare couplet.  Symptoms of palpitations associated with sinus and PVCs  [facility]

## 2010-09-30 NOTE — Letter (Signed)
Summary: Results  [facility]  [address]   [place], [place] [address]   Phone: [phone]  Fax: [phone]    12/14/2009  [facility] [address] [place], [place]  [address]  Dear Ms. Burrill,  Following are the results of your recent test(s):    Electrolytes -- normal   Blood Sugar -- mildly elevated   Kidney Function -- normal   Thyroid Function -- normal   Blood Counts -- normal  Please call my office if you have any questions.  Regards, Madlyn Frankel. Constance Goltz, MD  Appended Document: Results mailed.

## 2010-09-30 NOTE — Miscellaneous (Signed)
Summary: appt at [facility]  Clinical Lists Changes rec'd fax that she has an appt at [facility] . called pt. unable to reach . mailed it to her. appt is  01/30/10 at 8:45am..[facility] RN  Jan 20, 2010 2:20 PM

## 2010-09-30 NOTE — Progress Notes (Signed)
       Additional Follow-up for Phone Call Additional follow up Details #2::    gave NPI to [facility] [phone]). they saw her in ED for  L wrist.  Follow-up by: Golden Circle RN,  May 16, 2010 10:17 AM

## 2010-09-30 NOTE — Assessment & Plan Note (Signed)
Summary: Holter monitor/kh  Nurse Visit Holter moniter applied.   patient reports continued burning with urination and left flank pain. no fever. Dr. Constance Goltz notified and he will treat based on results of urinalysis form last week.. pharmacy is [facility]. , [place]. Theresia Lo RN  December 16, 2009 1:58 PM   Allergies: 1)  Bactrim (Sulfamethoxazole-Trimethoprim) Prescriptions: CEPHALEXIN 500 MG CAPS (CEPHALEXIN) 1 tab by mouth two times a day x5 days  #10 x 0   Entered and Authorized by:   Romero Belling MD   Signed by:   Romero Belling MD on 12/16/2009   Method used:   Electronically to        [facility] [address] [phone].* (retail)       [address]       [place], [place]  [address]       Ph: [phone]       Fax: [phone]   RxID:   [id number]   Appended Document: Holter monitor/kh    Clinical Lists Changes  Orders: Added new Test order of Est Level 1- [facility] [phone]) - Signed

## 2010-09-30 NOTE — Miscellaneous (Signed)
Summary: Procedure Consent  Procedure Consent   Imported By: Bradly Bienenstock 12/05/2009 09:00:56  _____________________________________________________________________  External Attachment:    Type:   Image     Comment:   External Document

## 2010-09-30 NOTE — Progress Notes (Signed)
Summary: refill  Phone Note Call from Patient Call back at Home Phone [phone]   Caller: Patient Summary of Call: was seen at [facility] a few weeks ago and was treated for scabies - has run out of cream and would like a refill to be able to treat all 5 children [facility] [place], [place] Initial call taken by: De Nurse,  March 12, 2010 1:43 PM  Follow-up for Phone Call        spoke with pt [facility] treated 2 of the 5 children for scabies, she used the cream that UC gave her for 2 children on all five, would like medications called into pharm for herself and all 5 children, stated that you saw  her son Clide Cliff last week and you are aware of this and would like medication sent to pharm and not have to come in to the office, advised I would send message to PCP, I would call her back, voiced understanding Follow-up by: Gladstone Pih,  March 12, 2010 2:23 PM  Additional Follow-up for Phone Call Additional follow up Details #1::        I sent eRx  thanks Lc    Additional Follow-up for Phone Call Additional follow up Details #2::    pt notified to pick up Rx at pharm  Follow-up by: Gladstone Pih,  March 13, 2010 4:16 PM  New/Updated Medications: PERMETHRIN 5 % CREA (PERMETHRIN) apply from neck down leave on overnight - 3 tubes Prescriptions: PERMETHRIN 5 % CREA (PERMETHRIN) apply from neck down leave on overnight - 3 tubes  #1 x 1   Entered and Authorized by:   Pearlean Brownie MD   Signed by:   Pearlean Brownie MD on 03/13/2010   Method used:   Electronically to        [facility] [address] [phone].* (retail)       [address]       [place], [place]  [address]       Ph: [phone]       Fax: [phone]   RxID:   [id number]

## 2010-09-30 NOTE — Assessment & Plan Note (Signed)
Summary: remove heart monitor,tcb  Nurse Visit Holter moniter removed and taken to EKG Dept at [facility].  Theresia Lo RN  December 17, 2009 2:17 PM    Allergies: 1)  Bactrim (Sulfamethoxazole-Trimethoprim)  Orders Added: 1)  No Charge Patient Arrived (NCPA0) [NCPA0]

## 2010-09-30 NOTE — Miscellaneous (Signed)
Summary: place on inside of leg,tcb  Pt not seen, her son gregory seen instead.  Her issue has resolved with Abx. ............................................... Shanda Bumps Avala March 05, 2010 9:25 AM  Vital Signs:  Patient profile:   45 year old female Height:      64 inches Weight:      185 pounds BMI:     31.87 BSA:     1.89 Temp:     98.3 degrees F Pulse rate:   68 / minute BP sitting:   119 / 82  Vitals Entered By: Jone Baseman CMA (March 05, 2010 9:12 AM) CC: place on inside of leg Is Patient Diabetic? No Pain Assessment Patient in pain? no        CC:  place on inside of leg.  Allergies: 1)  Bactrim (Sulfamethoxazole-Trimethoprim)   Complete Medication List: 1)  Simvastatin 40 Mg Tabs (Simvastatin) .... Take one tablet at bedtime 2)  Lamisil At 1 % Crea (Terbinafine hcl) .... Apply two times a day to skin lesion use for 1 week after completly cleared - 2 tubes 3)  Ranitidine Hcl 300 Mg Caps (Ranitidine hcl) .Marland Kitchen.. 1 by mouth at bedtime for heart burn  Other Orders: No Charge Patient Arrived (NCPA0) (NCPA0)

## 2010-09-30 NOTE — Miscellaneous (Signed)
  Clinical Lists Changes  Problems: Removed problem of CELLULITIS, LEG, RIGHT (ICD-682.6) Removed problem of TINEA CORPORIS (ICD-110.5) Removed problem of FACIAL PARESTHESIA (ICD-782.0)

## 2010-09-30 NOTE — Miscellaneous (Signed)
Summary: Mirena Placement Date  Clinical Lists Changes  IUD Mirena placed on 01/19/2005 lot [id number]

## 2010-09-30 NOTE — Assessment & Plan Note (Signed)
Summary: IUD,df   Vital Signs:  Patient profile:   45 year old female Height:      64 inches Weight:      188.3 pounds BMI:     32.44 Temp:     98.3 degrees F oral Pulse rate:   73 / minute BP sitting:   116 / 82  (left arm) Cuff size:   regular  Vitals Entered By: Garen Grams LPN (March 20, 2010 10:35 AM) CC: IUD Insertion Is Patient Diabetic? No Pain Assessment Patient in pain? no        CC:  IUD Insertion.  History of Present Illness: Here for removal of current iud and replacement. No problems with current IUD (it is out of date) Menses more or less regular but scant and not exactly every 30 days. No pain with intercourse or periods..  Habits & Providers  Alcohol-Tobacco-Diet     Tobacco Status: never  Allergies: 1)  Bactrim (Sulfamethoxazole-Trimethoprim)  Past History:  Past Medical History: IUD Mirena placed 03/20/2010. (this is her second IUD)  Review of Systems       denies abdominal pain or vaginal d/c.  Physical Exam  Genitalia:  normal introitus, no external lesions, no vaginal discharge, mucosa pink and moist, no vaginal or cervical lesions, and no vaginal atrophy.   Additional Exam:  Patient given informed consent for IUD removal. Signed copy in the chart.    Patient given informed consent for IUD insertion. She has no questions. Signed copy in chart. Patient placed in lithotomy position.Sterile specu;lum used.  Cervix viewed well. Strings identified emerging from os. Ring forceps used to gently remove IUD. No bleeding or complications noted.   Sterile prep and /equipment used for insertion of new IUD.Marland Kitchen Cervix cleansed X 3 with betadine. Tenaculum used to secure cervix by placement in anterior lip of cervix.  Uterine sound used and then IUD placed without problems. IUD strings trimmed to 2 inches and patient taught how to check for them. Post procedure, patient had some slight sense of light headed ness after the insertion procedure which resolved  after elevation of her legs for 2 minutes and resting in suoine position for another 5 minutes.    Impression & Recommendations:  Problem # 1:  CONTRACEPTIVE MANAGEMENT (ICD-V25.09)  Orders: U Preg-[facility] ([address]) IUD Supply-[facility] (U0454) IUD insert- [facility] (09811) IUD removal -[facility] (91478)  Complete Medication List: 1)  Simvastatin 40 Mg Tabs (Simvastatin) .... Take one tablet at bedtime 2)  Lamisil At 1 % Crea (Terbinafine hcl) .... Apply two times a day to skin lesion use for 1 week after completly cleared - 2 tubes 3)  Ranitidine Hcl 300 Mg Caps (Ranitidine hcl) .Marland Kitchen.. 1 by mouth at bedtime for heart burn 4)  Permethrin 5 % Crea (Permethrin) .... Apply from neck down leave on overnight - 3 tubes  Laboratory Results   Urine Tests  Date/Time Received: March 20, 2010 10:34 AM  Date/Time Reported: March 20, 2010 10:43 AM     Urine HCG: negative Comments: ...............test performed by......Marland KitchenBonnie A. [place], MLS (ASCP)cm

## 2010-09-30 NOTE — Assessment & Plan Note (Signed)
Summary: heart problem,df   Vital Signs:  Patient profile:   45 year old female Weight:      188.3 pounds Temp:     98.1 degrees F oral Pulse rate:   73 / minute BP sitting:   137 / 77  (right arm) Cuff size:   regular  Vitals Entered By: Garen Grams LPN (December 13, 2009 4:06 PM) CC: frequent palpitations Is Patient Diabetic? No   CC:  frequent palpitations.  History of Present Illness: 45 yo female presenting with increasing frequency of palpitations.  Describes them as "heart flops over" for a second, then feels funny for a minute, sometimes dizzy x1 second while it happens.  No loss of consciousness, falls, dyspnea, nausea, anxiety.  Can happen while sitting, walking, lying.  No assocation with activity or emotional stress.  Has had palpitations for approximately 2 years (chart indicates likely longer), formerly occurred once a week, but for the past 2-3 weeks have occurred several times a day and are stronger.  In a separate complaint, she also describes a strange sensation in the RIGHT side of her head associated with a strange feeling (no numbness or weakness) in her LEFT arm and leg.  These are transient and occur two times a day for the past 3 months.  She feels the onset may have been associated with a tooth extraction.  No temporal association with the palpitations.  Allergies (verified): 1)  Bactrim (Sulfamethoxazole-Trimethoprim)  Physical Exam  Additional Exam:  VITALS:  Reviewed, normal GEN: Alert & oriented, no acute distress NECK: Midline trachea, no masses/thyromegaly, no cervical lymphadenopathy CARDIO: Regular rate and rhythm, no murmurs/rubs/gallops, 2+ bilateral radial pulses RESP: Clear to auscultation, normal work of breathing, no retractions/accessory muscle use    Impression & Recommendations:  Problem # 1:  PALPITATIONS (ICD-785.1) Assessment Deteriorated TSH, CBC, BMet, 24h Holter monitor. Orders: Est Level 1- [facility] (99211) [facility]- Est  Level 4  (99214) 24 Hr Holter (24 Hr Holter) Basic Met-[facility] ([address]) CBC-[facility] (098[address]) TSH-[facility] ([address]-29[address])  Problem # 2:  FACIAL PARESTHESIA (ICD-782.0) Assessment: New RIGHT side of face.  Will defer workup until palpitations resolved.  Given frequency and duration of symptoms do not believe these are TIAs.  No real deficit reported--just an odd feeling.  Anxiety?  Orders: [facility]- Est  Level 4 (99214)  Complete Medication List: 1)  Amoxicillin 500 Mg Caps (Amoxicillin) .Marland Kitchen.. 1 by mouth three times a day for 10 days 2)  Simvastatin 40 Mg Tabs (Simvastatin) .... Take one tablet at bedtime  Other Orders: Urinalysis-[facility] (00000)  Patient Instructions: 1)  Schedule a nurse visit for Monday to have Holter monitor placed. 2)  I'll let you know the results of your labs.  Appended Document: heart problem,df EKG reviewed:  normal.  Appended Document: urine report Had complained of dysuria during triage but never mentioned it to me. I was unaware a urine had been checked.  I've reviewed the following and given lack of complaint in afebrile patient will not treat or culture.  Romero Belling MD  December 14, 2009 9:51 PM    Lab Visit  Laboratory Results   Urine Tests  Date/Time Received: December 13, 2009 4:11 PM  Date/Time Reported: December 13, 2009 5:06 PM   Routine Urinalysis   Color: yellow Appearance: Clear Glucose: negative   (Normal Range: Negative) Bilirubin: negative   (Normal Range: Negative) Ketone: negative   (Normal Range: Negative) Spec. Gravity: 1.025   (Normal Range: 1.003-1.035) Blood: trace-lysed   (Normal Range: Negative) pH:  6.0   (Normal Range: 5.0-8.0) Protein: negative   (Normal Range: Negative) Urobilinogen: 0.2   (Normal Range: 0-1) Nitrite: negative   (Normal Range: Negative) Leukocyte Esterace: trace   (Normal Range: Negative)  Urine Microscopic WBC/HPF: 1-5 RBC/HPF: 0-3 Bacteria/HPF: 2+ cocci Mucous/HPF: 1+ Epithelial/HPF: 5-10 with few clue cells      Comments: ...............test performed by......Marland KitchenBonnie A. [place], MLS (ASCP)cm    Orders Today:

## 2010-09-30 NOTE — Assessment & Plan Note (Signed)
Summary: f/up,tcb   Vital Signs:  Patient profile:   45 year old female Weight:      189 pounds Temp:     98.6 degrees F Pulse rate:   74 / minute BP sitting:   143 / 86  Vitals Entered By: Jone Baseman CMA (Jan 16, 2010 10:21 AM) CC: f/u birth control Is Patient Diabetic? No Pain Assessment Patient in pain? yes     Location: right elbow and left shoulder Intensity: 7   CC:  f/u birth control.  History of Present Illness: Palpitations still present.  Described as jumping flipping sensation in chest with occaisional choking sensation.  No chest pain with exertion.  No weight loss or fevers.  Holter wasnormal.  She does have daily heart burn symptoms.  Cholesterol Did not start simvastatin yet due to above.  No right upper quadrant pain or chest pain  Left neck and shoulder pain  after painting.  Burning sensation in upper back/neck on left without radiation or weakness.  Worse with certain head movements  Rash on arm for several weeks itchy.  Not treated with anything  ROS - as above PMH - Medications reviewed and updated in medication list.  Smoking Status noted in VS form    Habits & Providers  Alcohol-Tobacco-Diet     Tobacco Status: never  Current Medications (verified): 1)  Simvastatin 40 Mg Tabs (Simvastatin) .... Take One Tablet At Bedtime 2)  Lamisil At 1 % Crea (Terbinafine Hcl) .... Apply Two Times A Day To Skin Lesion Use For 1 Week After Completly Cleared - 2 Tubes 3)  Ranitidine Hcl 300 Mg Caps (Ranitidine Hcl) .Marland Kitchen.. 1 By Mouth At Bedtime For Heart Burn  Allergies: 1)  Bactrim (Sulfamethoxazole-Trimethoprim)  Social History: Rachel Bo, Annice Needy ;   Physical Exam  General:  Well-developed,well-nourished,in no acute distress; alert,appropriate and cooperative throughout examination Lungs:  Normal respiratory effort, chest expands symmetrically. Lungs are clear to auscultation, no crackles or wheezes. Heart:  Normal  rate and regular rhythm. S1 and S2 normal without gallop, murmur, click, rub or other extra sounds. Msk:  Pain in neck incresed with extremes of ROM but not with muscle resistance   L shoulder good range of motion with slight different pain at extremes.   Pulses:  normal radial pulses bilat Neurologic:  normal strenght and sensation in LUE  Skin:  obvious tinea lesion on left forearm    Impression & Recommendations:  Problem # 1:  NECK PAIN (ICD-723.1) Assessment New  most consistent with mild cervical impingement without weakness.  Recommend heat and refrain from aggravating activities  Orders: [facility]- Est  Level 4 ([address])  Problem # 2:  TINEA CORPORIS (ICD-110.5) Assessment: New  lamisil  Orders: [facility]- Est  Level 4 ([address]214)  Problem # 3:  HYPERCHOLESTEROLEMIA (ICD-272.0)  start simvastatin Her updated medication list for this problem includes:    Simvastatin 40 Mg Tabs (Simvastatin) .Marland Kitchen... Take one tablet at bedtime  Future Orders: Lipid-[facility] ([id number]) ... 01/01/2011 Comp Met-[facility] ([id number]) ... 01/01/2011  Problem # 4:  PALPITATIONS (ICD-785.1)  Holter was unremarkable.  Possibly gastrointestinal related.  regular antiacid - ranitadine  Orders: [facility]- Est  Level 4 ([address]657)  Complete Medication List: 1)  Simvastatin 40 Mg Tabs (Simvastatin) .... Take one tablet at bedtime 2)  Lamisil At 1 % Crea (Terbinafine hcl) .... Apply two times a day to skin lesion use for 1 week after completly cleared - 2 tubes 3)  Ranitidine Hcl 300 Mg Caps (  Ranitidine hcl) .Marland Kitchen.. 1 by mouth at bedtime for heart burn  Other Orders: Gynecologic Referral (Gyn)  Patient Instructions: 1)  Call if you do not get an appointment at Gynecology for tubal 2)  Use the cream two times a day until gone 3)  Start Simvastatin now come in for blood test 6 weeks after starting 4)  Use the ranitadine for heart burn and palpatations Prescriptions: RANITIDINE HCL 300 MG CAPS (RANITIDINE HCL) 1 by mouth at  bedtime for heart burn  #30 x 6   Entered and Authorized by:   Pearlean Brownie MD   Signed by:   Pearlean Brownie MD on 01/16/2010   Method used:   Electronically to        [facility] [phone].* (retail)       [address]       [place], [place]  [address]       Ph: [phone]       Fax: [phone]   RxID:   [id number] LAMISIL AT 1 % CREA (TERBINAFINE HCL) apply two times a day to skin lesion use for 1 week after completly cleared - 2 tubes  #1 x 1   Entered and Authorized by:   Pearlean Brownie MD   Signed by:   Pearlean Brownie MD on 01/16/2010   Method used:   Electronically to        [facility] [address] [phone].* (retail)       [address]       [place], [place]  [address]       Ph: [phone]       Fax: [phone]   RxID:   [id number]

## 2010-09-30 NOTE — Assessment & Plan Note (Signed)
Summary: aching all over/bilat rib pain/bilat leg/pain/bmc   Vital Signs:  Patient profile:   45 year old female Height:      64 inches Weight:      192 pounds BMI:     33.08 BSA:     1.92 Temp:     98.4 degrees F Pulse rate:   80 / minute BP sitting:   148 / 95  Vitals Entered By: Jone Baseman CMA (May 01, 2010 9:00 AM) CC: Achy all over x 3 days Is Patient Diabetic? No Pain Assessment Patient in pain? yes     Location: all over Intensity: 6   Primary Care Provider:  Pearlean Brownie MD  CC:  Achy all over x 3 days.  History of Present Illness: 1) Boils on leg: Right medial upper thigh. Recently treated for cellulitis in June (in same area at Urgent Care with course of Doxycycline - improved). One boil has returned 4 days ago with swelling, redness and pain in same area and started draining purulent material. She still had some doxycycline left over and started taking - took for 4 days but ran out yesterday. The redness and swelling has improved with this. She also reports some generalized muscle aches. Has taken Tylenol and motrin for pain which helps.   ROS: Denies fever, chills, nausea, emesis, diarrhea, constipation, URI symptoms, joint pain,   Med rec as below  Habits & Providers  Alcohol-Tobacco-Diet     Tobacco Status: never  Current Medications (verified): 1)  Simvastatin 40 Mg Tabs (Simvastatin) .... Take One Tablet At Bedtime 2)  Ranitidine Hcl 300 Mg Caps (Ranitidine Hcl) .Marland Kitchen.. 1 By Mouth At Bedtime For Heart Burn 3)  Doxycycline Hyclate 100 Mg Caps (Doxycycline Hyclate) .... One Tab By Mouth Two Times A Day X 3 Days  Allergies (verified): 1)  Bactrim (Sulfamethoxazole-Trimethoprim)  Physical Exam  General:  Well-developed,well-nourished,in no acute distress; alert,appropriate and cooperative throughout examination Nose:  no congestion Mouth:  moist membranes  Neck:  no lymphadenopathy   Lungs:  Normal respiratory effort, chest expands  symmetrically. Lungs are clear to auscultation, no crackles or wheezes. Skin:  erythematous, 2 cm x 2 cm nonfluctuant, indurated lesion on the right inner thigh- mildly ttp; no pus or drainage   Impression & Recommendations:  Problem # 1:  CELLULITIS, LEG, RIGHT (ICD-682.6) Assessment New  Complete course of doxycycline. Red flags reviewed with patient. Improving per patient assessment. Follow up as needed.  Her updated medication list for this problem includes:    Doxycycline Hyclate 100 Mg Caps (Doxycycline hyclate) ..... One tab by mouth two times a day x 3 days  Orders: [facility]- Est Level  3 ([address])  Complete Medication List: 1)  Simvastatin 40 Mg Tabs (Simvastatin) .... Take one tablet at bedtime 2)  Ranitidine Hcl 300 Mg Caps (Ranitidine hcl) .Marland Kitchen.. 1 by mouth at bedtime for heart burn 3)  Doxycycline Hyclate 100 Mg Caps (Doxycycline hyclate) .... One tab by mouth two times a day x 3 days  Patient Instructions: 1)  Continue to take doxycycline as instructed. 2)  Follow up if your symptoms are becoming worse.  3)  You may be coming down with a cold - stay well hydrated and take over the counter cold medicine as needed. Avoid mediciations with pseudoephedrine given your high blood pressure.  4)    Prescriptions: DOXYCYCLINE HYCLATE 100 MG CAPS (DOXYCYCLINE HYCLATE) one tab by mouth two times a day x 3 days  #6 x 0  Entered and Authorized by:   Bobby Rumpf  MD   Signed by:   Bobby Rumpf  MD on 05/01/2010   Method used:   Electronically to        [facility] [phone].* (retail)       [address]       [place], [place]  [address]       Ph: [phone]       Fax: [phone]   RxID:   [id number]

## 2010-09-30 NOTE — Assessment & Plan Note (Signed)
Summary: discuss getting tubes tied,tcb   Vital Signs:  Patient profile:   45 year old female Weight:      191.3 pounds Temp:     99.3 degrees F oral Pulse rate:   82 / minute BP sitting:   116 / 82  (right arm) Cuff size:   regular  Vitals Entered By: Garen Grams LPN (November 25, 2009 11:34 AM) CC: discuss birth control and cholesterol Is Patient Diabetic? No Pain Assessment Patient in pain? no        CC:  discuss birth control and cholesterol.  History of Present Illness: Hyperlipidemia Has been off lipitor due to cost.  Will get medicaid again so can afford. No chest pain on exertion or right upper quadrant abdominal pain.  Tooth abscess seen in ER last week for broken left mandibular tooth with abscess.  Given amoxicillin for 7 days which helped but is now worsening and has mild fever.  No nausea or vomiting or drainage  Contraception currently has mirena - not sure when expires.  Wants repeat mirena or tubal.  very sure does not want more kids   ROS - as above PMH - Medications reviewed and updated in medication list.  Smoking Status noted in VS form    Habits & Providers  Alcohol-Tobacco-Diet     Tobacco Status: never  -  Date:  08/31/2009    PAP in [facility] was normal   Current Medications (verified): 1)  Amoxicillin 500 Mg Caps (Amoxicillin) .Marland Kitchen.. 1 By Mouth Three Times A Day For 10 Days 2)  Simvastatin 40 Mg Tabs (Simvastatin) .... Take One Tablet At Bedtime  Allergies: 1)  Bactrim (Sulfamethoxazole-Trimethoprim)  Social History: Rachel Bo, Ferman Hamming - husband  Physical Exam  General:  Well-developed,well-nourished,in no acute distress; alert,appropriate and cooperative throughout examination Mouth:  Left second incisor mandible broken with surrounding soft tissue swelling no definite fluctuance.  Neck without adenopathy    Impression & Recommendations:  Problem # 1:  ABSCESS, TOOTH  (ICD-522.5)  retreat with amoxicillin. Suggested contacting dentist for extraction  Orders: [facility]- Est  Level 4 ([address])  Problem # 2:  HYPERCHOLESTEROLEMIA (ICD-272.0)  restart statin  The following medications were removed from the medication list:    Lipitor 40 Mg Tabs (Atorvastatin calcium) .Marland Kitchen... Take 1 tab by mouth daily Her updated medication list for this problem includes:    Simvastatin 40 Mg Tabs (Simvastatin) .Marland Kitchen... Take one tablet at bedtime  Orders: [facility]- Est  Level 4 ([address])  Problem # 3:  ASCUS PAP (ICD-795.01) reports normal Pap in [facility] clinic in Jan 2011  Problem # 4:  OTH GENERAL CNSL&ADVICE CONTRACEPT MANAGEMENT (ICD-V25.09)  She is certain she does not want more children.  Will refer to Gyn for tubal  Orders: [facility]- Est  Level 4 ([address]11)  Complete Medication List: 1)  Amoxicillin 500 Mg Caps (Amoxicillin) .Marland Kitchen.. 1 by mouth three times a day for 10 days 2)  Simvastatin 40 Mg Tabs (Simvastatin) .... Take one tablet at bedtime  Patient Instructions: 1)  Will call you with status of your IUD and make appointment with gynecology 2)  Take all the amox and see a dentist asap If you get high fever > 102 or feel really bad then go to the ER 3)  Come in for fasting cholesterol 2 mo after start simvastatin Prescriptions: SIMVASTATIN 40 MG TABS (SIMVASTATIN) Take one tablet at bedtime  #30 x 3   Entered and Authorized by:  Pearlean Brownie MD   Signed by:   Pearlean Brownie MD on 11/25/2009   Method used:   Electronically to        [facility] [address] [phone].* (retail)       [address]       [place], [place]  [address]       Ph: [phone]       Fax: [phone]   RxID:   [id number] AMOXICILLIN 500 MG CAPS (AMOXICILLIN) 1 by mouth three times a day for 10 days  #30 x 1   Entered and Authorized by:   Pearlean Brownie MD   Signed by:   Pearlean Brownie MD on 11/25/2009   Method used:   Electronically to        [facility] [address] [phone].* (retail)       [address]       [place], [place]  [address]       Ph: [phone]       Fax: [phone]   RxID:   [id number]   Appended Document: Orders Update    Clinical Lists Changes  Orders: Added new Test order of Hepatic-[facility] [id number]) - Signed Added new Test order of Lipid-[facility] ([id number]) - Signed

## 2010-10-02 NOTE — Miscellaneous (Signed)
Summary: Treat scabies   Clinical Lists Changes  Medications: Added new medication of PERMETHRIN 5 % CREA (PERMETHRIN) Apply cream from head to toe; leave on for 8-14 hours before washing off with water - may repeat 1 week later if not improved. Disp 60g - Signed Rx of PERMETHRIN 5 % CREA (PERMETHRIN) Apply cream from head to toe; leave on for 8-14 hours before washing off with water - may repeat 1 week later if not improved. Disp 60g;  #1 x 1;  Signed;  Entered by: Bobby Rumpf  MD;  Authorized by: Bobby Rumpf  MD;  Method used: Electronically to [facility] [phone].*, [address], [place], [place], [place]  [address], Ph: [phone], Fax: [phone]  Son Haley Mclaughlin) seen on 09/16/10 for scabies - remainder of family with similar symptoms - treatment for all household contacts.  Bobby Rumpf  MD  September 16, 2010 4:25 PM  Prescriptions: PERMETHRIN 5 % CREA (PERMETHRIN) Apply cream from head to toe; leave on for 8-14 hours before washing off with water - may repeat 1 week later if not improved. Disp 60g  #1 x 1   Entered and Authorized by:   Bobby Rumpf  MD   Signed by:   Bobby Rumpf  MD on 09/16/2010   Method used:   Electronically to        [facility] [phone].* (retail)       [address]       [place], [place]  [address]       Ph: [phone]       Fax: [phone]   RxID:   [id number]

## 2011-01-16 NOTE — H&P (Signed)
Haley Mclaughlin, Haley Mclaughlin                         ACCOUNT NO.:  [id number]   MEDICAL RECORD NO.:  [id number]                   PATIENT TYPE:  INP   LOCATION:  2034                                 FACILITY:  [facility]   PHYSICIAN:  Sibyl Parr. Fields, M.D.                DATE OF BIRTH:  [birth year]   DATE OF ADMISSION:  02/11/2003  DATE OF DISCHARGE:                                HISTORY & PHYSICAL   PRIMARY CARE PHYSICIAN:  Dr. Pearlean Brownie at [facility]   CHIEF COMPLAINT:  Chest pain.   HISTORY OF PRESENT ILLNESS:  The patient is a 45 year old female with a  history of panic attacks and chest pain in the past, with normal stress test  in November 2002 at [facility], and also with history of  hypercholesterolemia, who presents with left-sided chest pain x1 day,  started this evening, also with neck and left arm pain, but those have been  present for the past few days.  The chest pain was worse this evening.  The  patient has admitted to some dyspnea on exertion with ambulation for the  past few days and some left thoracic pain during the same time period,  nausea with the chest pain, but no vomiting and no diarrhea and minimal  diaphoresis.  No provocative or palliative factors.  She had some improved  breathing with oxygen in the ambulance, however.  She does take oral  contraceptive pills, but she does not smoke.  She does admit to some  increased social stressors in the past month.  Of note, she also has an  early family history of CAD.   REVIEW OF SYSTEMS:  Review of systems positive for fevers two days ago and  got to 103.3, per patient's report, but no fever since that time.  Does  admit to a family member with some recent viral illness.  RESPIRATORY:  Shortness of breath x2-3 days with some dyspnea on exertion.  Did have some  left-sided numbness one month ago but resolved in 15 minutes and she was  under significant stress at that time.  She also had a  panic attack  approximately three weeks ago.  On musculoskeletal review of systems, she  did admit to some left calf pain, as in HPI.   PAST MEDICAL HISTORY:  1. Hypercholesterolemia for approximately nine years.  LDL, by patient     report, was 279, which decreased to 149 with starting Lipitor.  This was     tested last week, per patient's report.  2. Panic attacks, last one approximately three weeks ago.  No medications     used at that point and is not taking anything currently.  3. Palpitations.  4. Chest pain.  5. History of appendectomy.  6. History of stress test in November 2002 at [facility] which was     reportedly normal.   MEDICATIONS:  1. Lipitor 20 mg daily.  2. Zantac 150 mg p.r.n.  3. Aspirin 81 mg approximately two times a week.  4. Nordette 28 oral contraceptives daily, which she just restarted a new     cycle today.   ALLERGIES:  No known drug allergies.   SOCIAL HISTORY:  Has four children in her home, is married.  Denies tobacco  or alcohol use.  Admits to increased stressors at home for the past two  months.  Denies any concerns for home safety.  As far as travel, she has had  multiple recent trips to the beach but no long distance travel and no recent  surgeries.   FAMILY HISTORY:  Mother with an MI x2, the first one at 75 years of age and  status post stents x2.  Father with an MI in his 45s and history of stents  x2 as well.   PHYSICAL EXAMINATION:  VITALS:  Temperature 97.5, blood pressure 136/73,  pulse 75, respiratory rate 22 and 100% saturation on room air.  GENERAL:  Alert, appeared comfortable, in no acute distress.  Affect was  mood congruent.  Mood was euthymic.  A&O x3.  HEENT:  PERRL.  EOMI.  TMs pearly gray.  Moist oral mucosa.  No erythema or  exudate.  Dentition intact.  NECK:  No mass, no thyromegaly, no lymphadenopathy, no JVD.  RESPIRATORY:  Respiratory is clear to auscultation bilaterally, good effort,  no distress.  Breath  sounds equal bilaterally.  Good respiratory effort.  CARDIOVASCULAR:  Regular rate and rhythm with no murmurs.  No heaves, no  lifts, no peripheral cardiovascular heaves, no edema.  Pedal pulses were 2+  and equal.  MUSCULOSKELETAL:  She had minimal tenderness to deep palpation of her medial  left calf.  No cords palpated.  GI:  Normoactive bowel sounds.  Nontender and nondistended.  No  hepatosplenomegaly apparent.  SKIN:  There is a slight erythematous rash on the cheeks bilaterally, right  greater than left, no exudate and no surrounding erythema at the calves.  NEUROLOGIC:  Nonfocal, moving all extremities well, strength 5/5.  Reflexes  intact and equal bilaterally.  No facial muscular weakness.   ADMISSION LABORATORY DATA:  White blood cells 8, hemoglobin 12.9 and  platelets 190,000.  EF 65%, CK 98, MB 0.9, troponin less than 0.01.  I-STAT  electrolytes:  Sodium 143, potassium 4.1, chloride 108, bicarb 22, BUN 10,  creatinine 0.9, glucose 103, pH of 7.40 with a PCO2 of 35.6.   EKG shows normal sinus rhythm, no ST or T wave changes, good R wave  progression.  Chest x-ray showed no acute disease.  Spiral CT was negative for PE.  Lower  extremity CT followup study was negative for DVT.   ASSESSMENT AND PLAN:  [age]-year-old with history of panic attacks,  hypercholesterolemia and chest pain in the past, negative stress test in the  past, who now presents with a three-day history of dyspnea on exertion and  chest pain x1 day.   1. Shortness of breath with dyspnea on exertion:  Chest x-ray showed no     acute disease.  She is afebrile currently with a normal white count.     Calf tenderness is concerning initially for pulmonary embolus in a     patient with oral contraceptive pill use, although she is not a smoker,     but spiral CT and lower extremity CT were negative for deep venous     thrombosis and pulmonary embolus, which  is overall reassuring.  This may    be at the time  of her panic attack.  Oxygen saturation is stable on room     air.  Respiratory status is good.  She has good effort and no     abnormalities picked up on auscultation.  Also, PCO2 is normal on i-STAT.     We will follow her respiratory effort and oxygen saturations, place her     on oxygen nasal cannula for rule out myocardial infarction if     symptomatic.  2. Chest pain of one-day duration.  Risk factors include positive family     history and hypercholesterolemia.  She has a fairly consistent story for     her chest pain with the radiation, although the left arm and neck pain     may have been present longer than the chest pain.  We will rule her out     for myocardial infarction.  The electrocardiogram was essentially     negative for acute change and the first set of enzymes were also negative     for cardiac damage.  We will start her on aspirin 325 mg p.o. daily and     we will start her on a low-dose beta blocker, namely metoprolol 12.5 mg     p.o. b.i.d., as her heart rate and blood pressure should tolerate this     dose and continue her on this while we are ruling her out for myocardial     infarction.  She has not had any hypertension to my knowledge, therefore,     if ruled out for myocardial infarction, this should be stopped.  Also, we     will make nitroglycerin available as needed and we will watch for     improvement with nitrates which would be more suggestive of a     cardiovascular type of pain or esophageal spasm.  3. Hypercholesterolemia:  By patient report, LDL has improved from 279 to     149 on Lipitor; this was on recent fasting lipids, therefore, we will not     check fasting lipids at this point in time.  We will continue Lipitor at     present dose, with further titration as an outpatient by her primary     medical doctor.  4. History of panic attacks with increased social stressors currently.  Has     had these symptoms in the past due to her life stressors  that may be     causing her current hospitalization.  She may need prophylactic therapy,     i.e., BuSpar or an selective serotonin reuptake inhibitor.  She denies     any anxiety or distress at this point in time, therefore, we will hold     off on anxiolytics currently.     Silas Sacramento, M.D.                         Sibyl Parr. Darrick Penna, M.D.    Jearld Pies  D:  02/12/2003  T:  02/12/2003  Job:  [id number]   cc:   Pearlean Brownie, M.D.  [address] [place]  [place] [address]  Fax: [phone]

## 2011-01-16 NOTE — Discharge Summary (Signed)
Haley Mclaughlin, Haley Mclaughlin                         ACCOUNT NO.:  [id number]   MEDICAL RECORD NO.:  [id number]                   PATIENT TYPE:  INP   LOCATION:  2034                                 FACILITY:  [facility]   PHYSICIAN:  Silas Sacramento, M.D.                   DATE OF BIRTH:  1965-10-19   DATE OF ADMISSION:  02/12/2003  DATE OF DISCHARGE:  02/12/2003                                 DISCHARGE SUMMARY   SERVICE:  Family practice teaching service.   DISCHARGE DIAGNOSES:  1. Chest pain, noncardiac.  2. Hypercholesterolemia.  3. Panic attacks, by history.   BRIEF HISTORY OF PRESENT ILLNESS:  Patient is a 45 year old female with a  history of panic attacks who presented with chest pain with a normal stress  test in November 2002 at [facility], also hypercholesterolemia, who  presents with left-sided chest pain, also with left arm and neck pain, worse  that evening.  Had some shortness of breath with ambulation the past few  days, possible left calf pain the past two to three days, positive nausea  with chest pain but minimal diaphoresis, improved breathing with oxygen in  ambulance.  Positive OCPs.  No tobacco.  Increased stress in the past one  month.  Positive family history of CAD.   REVIEW OF SYSTEMS:  Positive for shortness of breath and as above in HPI.  Also thought to have a fever two days ago but none since that time.   PERTINENT EXAMINATION FINDINGS ON ADMISSION:  VITAL SIGNS:  Temp 97.5, blood  pressure 136/73, pulse 75, respiratory rate 22, 100% sat on room air.   LABORATORY DATA:  EKG was normal sinus rhythm; no ST-T-wave changes, good R-  wave progression.  Spiral CT was negative PE.  Lower extremity DP was  negative for DVT.  Initial enzymes, CK of 98, MB 0.09, troponin less than  0.01.   PHYSICAL EXAMINATION:  LUNGS:  On exam, she had no wheezes, no rhonchi.  CARDIOVASCULAR:  Regular rate and rhythm.  No murmurs.  NEUROLOGIC:  Nonfocal neurologic exam.   HOSPITAL COURSE:  Problem 1.  Chest pain with shortness of breath.  She did  have risk factors with family history, hypercholesterolemia.  With  consistent story, initial EKG negative for ST- or T-wave changes.  First set  of enzymes were negative.  Enzymes continued to be negative.  EKG the next  morning was also negative for ST- or T-wave changes or acute signs of  cardiac chest pain.  Chest pain was resolved.  Troponin less than 0.01, less  than 0.01, less than 0.01 with CKs 98, 88, 84.  Decision was made that chest  pain most likely had a panic component; therefore, was restarted on her  Paxil 10 mg p.o. q.a.m., was continued on her Lipitor and Zantac for  possible gastrointestinal causes of chest discomfort, and continued on her  oral  contraceptives.  She ruled out for a DVT or PE as a cause of chest pain  with a spiral CT being negative.  O2 sat was stable on room air throughout  hospitalization.  As stated, she was chest pain-free on discharge.  Problem 2.  Hypercholesterolemia.  LDL apparently improved from 271 to 149  as an outpatient on Lipitor; she continued on Lipitor during her  hospitalization and discharged on home dose of 20 mg p.o. q.h.s.  Problem 3.  Panic attacks.  She apparently had some increased social  stressors currently, including family problems.  Decided to restart her  Paxil at 10 mg per day initially to be titrated up as an outpatient for her  panic disorder.  Further discussion with her primary physician about further  counseling or assistance with her social stressors, such as her son.  Patient discharged in stable condition on February 12, 2003, on the following  medications.   DISCHARGE MEDICATIONS:  1. Paxil 10 mg one p.o. q.a.m.  2. Lipitor 20 mg p.o. q.h.s.  3. Zantac 150 mg p.o. daily p.r.n. heartburn.  4. Nordette oral contraceptives one p.o. daily as directed.   PAIN MANAGEMENT:  Tylenol 650 mg p.o. q.6h. p.r.n. pain.   ACTIVITY:  No  restrictions.   DIET:  Low-cholesterol diet.   WOUND CARE:  Not applicable.   SPECIAL INSTRUCTIONS:  Patient is to call her doctor or return to the ER if  develops chest pain, difficulty breathing, or other ________ as well.   FOLLOW UP:  Scheduled for the service on March 01, 2003, at 10:15 a.m. with  Dr. Deirdre Priest at [facility].                                               Silas Sacramento, M.D.    Jearld Pies  D:  03/07/2003  T:  03/08/2003  Job:  [id number]

## 2011-01-16 NOTE — Discharge Summary (Signed)
[facility]  Patient:    Haley Mclaughlin, Haley Mclaughlin                      MRN: [id number] Adm. Date:  [id number] Disc. Date: 08/08/00 Attending:  Michaelle Copas Dictator:   Gwenlyn Perking, M.D. CC:         [facility]   Discharge Summary  ADMISSION DIAGNOSES:          Intrauterine pregnancy at 27 weeks and 2 days with preterm contractions, recent bacterial vaginosis.  DISCHARGE DIAGNOSES:          Intrauterine pregnancy at 27 weeks and 3 days, preterm contractions resolved.  SERVICE:                      Teaching service.  [facility]  CONSULTS:                     None.  PROCEDURE:                    None.  HISTORY AND PHYSICAL:         See admission H&P.  HOSPITAL COURSE:              The patient is a 45 year old G6, P4-0-1-4 who presented to the maternity admissions unit on August 07, 2000 at 27 and 2 weeks intrauterine pregnancy with complaint of contractions every two to three minutes and associated increased abdominal pain.  Patient also complained of clear whitish discharge that had changed to a greenish discharge after a visit at the maternity admissions unit two days prior to this admission.  At that time bacterial vaginosis was diagnosed and the patient was put on Flagyl. [facility] had initially some uterine irritability which completely resolved with hydration.  Patient also was prophylactically placed on Unasyn.  On August 08, 2000 it was decided that the patient had benefited maximally from this hospital admission since her preterm contractions had resolved and after an obstetric ultrasound revealed a normal amniotic fluid index as well as a normal cervical length at 4.7 cm.  The patient was advised to sit at work if possible and avoid prolonged periods of standing.  In addition, the patient was advised to continue her Flagyl that was prescribed on  August 05, 2000.  CONDITION ON DISCHARGE:       Good.  DISPOSITION:                  Discharged patient to home.  DISCHARGE MEDICATIONS:        1. Prenatal vitamins one p.o. q.d.                               2. Flagyl 500 mg one p.o. b.i.d. x 5 days.  INSTRUCTIONS/ACTIVITY:        Rest as much as possible.  DIET:                         The patient is to continue her diet and increase her water intake.  Symptoms to warrant for further treatment:  The patient is given preterm labor instructions and instructed to come back should any of those symptoms arise.  ALLERGIES:  No known drug allergies.  FOLLOW-UP:                    The [facility]  At that time a GC and chlamydia probe need to be checked, the results need to be checked, which was obtained on August 07, 2000. DD:  08/08/00 TD:  08/08/00 Job: [id number]

## 2011-01-17 ENCOUNTER — Emergency Department: Payer: Self-pay | Admitting: Unknown Physician Specialty

## 2011-03-11 ENCOUNTER — Encounter: Payer: Self-pay | Admitting: Family Medicine

## 2011-03-11 ENCOUNTER — Ambulatory Visit (INDEPENDENT_AMBULATORY_CARE_PROVIDER_SITE_OTHER): Payer: Medicaid Other | Admitting: Family Medicine

## 2011-03-11 VITALS — BP 135/85 | HR 82 | Temp 98.1°F | Wt 190.0 lb

## 2011-03-11 DIAGNOSIS — R519 Headache, unspecified: Secondary | ICD-10-CM | POA: Insufficient documentation

## 2011-03-11 DIAGNOSIS — Z975 Presence of (intrauterine) contraceptive device: Secondary | ICD-10-CM

## 2011-03-11 DIAGNOSIS — R51 Headache: Secondary | ICD-10-CM

## 2011-03-11 DIAGNOSIS — R87610 Atypical squamous cells of undetermined significance on cytologic smear of cervix (ASC-US): Secondary | ICD-10-CM

## 2011-03-11 DIAGNOSIS — IMO0001 Reserved for inherently not codable concepts without codable children: Secondary | ICD-10-CM | POA: Insufficient documentation

## 2011-03-11 MED ORDER — TRAMADOL HCL 50 MG PO TABS: 50.0000 mg | ORAL_TABLET | Freq: Three times a day (TID) | ORAL | Status: DC | PRN

## 2011-03-11 NOTE — Patient Instructions (Signed)
Use Ultram with or without ibuprofen for your headaches  If the headaches are not better in 2 weeks or get worse then come back  You need a PAP smear soon!  Your IUD was put in July 2011 You had a colposcopy in August 2010

## 2011-03-11 NOTE — Progress Notes (Signed)
  Subjective:    Patient ID: Haley Mclaughlin, female    DOB: 09/14/1965, 45 y.o.   MRN: [id number]  HPI  HEADACHE   Onset: about a week ago  Location: all over Quality: feels full Frequency: on and off Precipitating factors: when tired at end of day Prior treatment: using Ibuprofen 800 mg which helps some  Associated Symptoms Nausea/vomiting: no  Photophobia/phonophobia: yes, mildly  Tearing of eyes: no  Sinus pain/pressure: no  Personal stressors: yes, but usual  Relation to menstrual cycle: no   Red Flags Fever: no  Neck pain/stiffness: no  Vision/speech/swallow/hearing difficulty: no  Focal weakness/numbness: yes, has a vague slightly numb feeling on her left side comes and goes she has had before No weakness or incordination or inability to do activities  Altered mental status: no  Trauma: no  New type of headache: no  Anticoagulant use: no  H/o cancer/HIV/Pregnancy: no   ASCUS History of ASCUs last pap smear in 2010.  No vaginal symptoms of bleeding.  Never had followup pap smear  Review of Symptoms - see HPI  PMH - Had CNS imaging in 2005 that was normal.      Review of Systems     Objective:   Physical Exam  Neurologic exam : Cn 2-7 intact Strength equal & normal in upper & lower extremities Able to walk on heels and toes.   Balance normal  Romberg normal, finger to nose normal  Eye - Pupils Equal Round Reactive to light, Extraocular movements intact, Fundi without hemorrhage or visible lesions, Conjunctiva without redness or discharge  Neck:  No deformities, thyromegaly, masses, or tenderness noted.   Supple with full range of motion without pain.          Assessment & Plan:

## 2011-03-11 NOTE — Assessment & Plan Note (Signed)
New.   No red flags.  Most likely due to transient viral infection complicated by tension type headache.  Will treat with ultram and follow.

## 2011-03-11 NOTE — Assessment & Plan Note (Signed)
Strongly advised her to have a Pap smear soon

## 2011-03-16 ENCOUNTER — Ambulatory Visit (INDEPENDENT_AMBULATORY_CARE_PROVIDER_SITE_OTHER): Payer: Medicaid Other | Admitting: Family Medicine

## 2011-03-16 ENCOUNTER — Other Ambulatory Visit (HOSPITAL_COMMUNITY)
Admission: RE | Admit: 2011-03-16 | Discharge: 2011-03-16 | Disposition: A | Discharge status: Home / Self Care | Payer: Medicaid Other | Source: Ambulatory Visit | Attending: Family Medicine | Admitting: Family Medicine

## 2011-03-16 ENCOUNTER — Encounter: Payer: Self-pay | Admitting: Family Medicine

## 2011-03-16 VITALS — BP 122/76 | HR 92 | Temp 98.3°F | Wt 190.0 lb

## 2011-03-16 DIAGNOSIS — Z124 Encounter for screening for malignant neoplasm of cervix: Secondary | ICD-10-CM

## 2011-03-16 DIAGNOSIS — Z01419 Encounter for gynecological examination (general) (routine) without abnormal findings: Secondary | ICD-10-CM

## 2011-03-16 DIAGNOSIS — R51 Headache: Secondary | ICD-10-CM

## 2011-03-16 DIAGNOSIS — R87610 Atypical squamous cells of undetermined significance on cytologic smear of cervix (ASC-US): Secondary | ICD-10-CM

## 2011-03-16 MED ORDER — SUMATRIPTAN SUCCINATE 100 MG PO TABS: 100.0000 mg | ORAL_TABLET | ORAL | Status: DC | PRN

## 2011-03-16 NOTE — Assessment & Plan Note (Signed)
Sounds to be atypical migraine vs tension headache.  Therapeutic trial of triptan

## 2011-03-16 NOTE — Progress Notes (Signed)
  Subjective:    Patient ID: Haley Mclaughlin, female    DOB: [birth year], 45 y.o.   MRN: [id number]  HPI  Here for Pap No vaginal bleeding except for 2 weeks ago after sex and none since.  Has occasional cramping pain but no discharge  Headache No better seems to be happening a little more often.  Currently has one started 2 hours ago with vision "blurring" which has resolved but still has unilateral headache without any GI upset or focal weakness or numbness Tramadol helps but makes her sleepy     Review of Systems     Objective:   Physical Exam Genitalia:  Normal introitus for age, no external lesions, no vaginal discharge, mucosa pink and moist, no vaginal or cervical lesions, IUD string in place no vaginal atrophy, no friaility or hemorrhage, normal uterus size and position, no adnexal masses or tenderness.  Exam limited by habitus        Assessment & Plan:

## 2011-03-16 NOTE — Patient Instructions (Signed)
You will get a letter about your Pap smear  Take the sumatriptan one when you have a headache may repeat in 2 hours if the first helped some  If these do not help then use the combination of tramadol and ibuprofen but do NOT take tramadol with sumatriptan  If the headaches are not better in 4 weeks then come back or if they get worse

## 2011-03-16 NOTE — Assessment & Plan Note (Signed)
Pap done today  

## 2011-03-19 ENCOUNTER — Encounter: Payer: Self-pay | Admitting: Family Medicine

## 2011-04-06 ENCOUNTER — Other Ambulatory Visit: Payer: Self-pay | Admitting: Family Medicine

## 2011-04-06 DIAGNOSIS — Z1231 Encounter for screening mammogram for malignant neoplasm of breast: Secondary | ICD-10-CM

## 2011-04-20 ENCOUNTER — Ambulatory Visit (HOSPITAL_COMMUNITY)
Admission: RE | Admit: 2011-04-20 | Discharge: 2011-04-20 | Disposition: A | Discharge status: Home / Self Care | Payer: Medicaid Other | Source: Ambulatory Visit | Attending: Family Medicine | Admitting: Family Medicine

## 2011-04-20 DIAGNOSIS — Z1231 Encounter for screening mammogram for malignant neoplasm of breast: Secondary | ICD-10-CM

## 2011-05-06 ENCOUNTER — Emergency Department (HOSPITAL_COMMUNITY)
Admission: EM | Admit: 2011-05-06 | Discharge: 2011-05-06 | Disposition: A | Discharge status: Home / Self Care | Payer: Medicaid Other | Attending: Emergency Medicine | Admitting: Emergency Medicine

## 2011-05-06 ENCOUNTER — Emergency Department (HOSPITAL_COMMUNITY): Payer: Medicaid Other

## 2011-05-06 DIAGNOSIS — S63509A Unspecified sprain of unspecified wrist, initial encounter: Secondary | ICD-10-CM | POA: Insufficient documentation

## 2011-05-06 DIAGNOSIS — S6990XA Unspecified injury of unspecified wrist, hand and finger(s), initial encounter: Secondary | ICD-10-CM | POA: Insufficient documentation

## 2011-05-06 DIAGNOSIS — E785 Hyperlipidemia, unspecified: Secondary | ICD-10-CM | POA: Insufficient documentation

## 2011-05-06 DIAGNOSIS — M25539 Pain in unspecified wrist: Secondary | ICD-10-CM | POA: Insufficient documentation

## 2011-05-06 DIAGNOSIS — S59909A Unspecified injury of unspecified elbow, initial encounter: Secondary | ICD-10-CM | POA: Insufficient documentation

## 2011-05-06 DIAGNOSIS — W010XXA Fall on same level from slipping, tripping and stumbling without subsequent striking against object, initial encounter: Secondary | ICD-10-CM | POA: Insufficient documentation

## 2011-05-06 DIAGNOSIS — M25439 Effusion, unspecified wrist: Secondary | ICD-10-CM | POA: Insufficient documentation

## 2011-06-02 LAB — DIFFERENTIAL
Basophils Absolute: 0
Eosinophils Absolute: 0.1
Eosinophils Relative: 1
Monocytes Absolute: 0.3

## 2011-06-02 LAB — CBC
HCT: 40.3
Hemoglobin: 13.9
MCHC: 34.4
MCV: 88.6
Platelets: 222
RDW: 12.8

## 2011-06-02 LAB — POCT I-STAT, CHEM 8
BUN: 9
Calcium, Ion: 1.25
Chloride: 105
Creatinine, Ser: 0.9
Glucose, Bld: 128 — ABNORMAL HIGH
HCT: 42
Hemoglobin: 14.3
Potassium: 4.2
Sodium: 143
TCO2: 29

## 2011-06-02 LAB — POCT PREGNANCY, URINE: Preg Test, Ur: NEGATIVE

## 2011-06-02 LAB — URINALYSIS, ROUTINE W REFLEX MICROSCOPIC
Bilirubin Urine: NEGATIVE
Glucose, UA: NEGATIVE
Hgb urine dipstick: NEGATIVE
Ketones, ur: NEGATIVE
Nitrite: NEGATIVE
Protein, ur: NEGATIVE
Specific Gravity, Urine: 1.026
Urobilinogen, UA: 0.2
pH: 6

## 2011-06-02 LAB — WET PREP, GENITAL: Clue Cells Wet Prep HPF POC: NONE SEEN

## 2011-06-02 LAB — RPR: RPR Ser Ql: NONREACTIVE

## 2011-06-17 ENCOUNTER — Inpatient Hospital Stay (HOSPITAL_COMMUNITY)
Admission: AD | Admit: 2011-06-17 | Discharge: 2011-06-17 | Disposition: A | Discharge status: Home / Self Care | Payer: Medicaid Other | Source: Ambulatory Visit | Attending: Obstetrics & Gynecology | Admitting: Obstetrics & Gynecology

## 2011-06-17 ENCOUNTER — Encounter (HOSPITAL_COMMUNITY): Payer: Self-pay | Admitting: *Deleted

## 2011-06-17 DIAGNOSIS — N949 Unspecified condition associated with female genital organs and menstrual cycle: Secondary | ICD-10-CM | POA: Insufficient documentation

## 2011-06-17 DIAGNOSIS — R109 Unspecified abdominal pain: Secondary | ICD-10-CM

## 2011-06-17 DIAGNOSIS — Z30432 Encounter for removal of intrauterine contraceptive device: Secondary | ICD-10-CM

## 2011-06-17 HISTORY — DX: Hyperlipidemia, unspecified: E78.5

## 2011-06-17 LAB — URINE MICROSCOPIC-ADD ON

## 2011-06-17 LAB — URINALYSIS, ROUTINE W REFLEX MICROSCOPIC
Bilirubin Urine: NEGATIVE
Ketones, ur: NEGATIVE mg/dL
Nitrite: NEGATIVE
Urobilinogen, UA: 0.2 mg/dL (ref 0.0–1.0)

## 2011-06-17 LAB — CBC
MCH: 30.2 pg (ref 26.0–34.0)
MCHC: 33.3 g/dL (ref 30.0–36.0)
MCV: 90.7 fL (ref 78.0–100.0)
Platelets: 229 10*3/uL (ref 150–400)

## 2011-06-17 LAB — WET PREP, GENITAL: Yeast Wet Prep HPF POC: NONE SEEN

## 2011-06-17 MED ORDER — NAPROXEN SODIUM 550 MG PO TABS: 550.0000 mg | ORAL_TABLET | Freq: Two times a day (BID) | ORAL | Status: DC

## 2011-06-17 NOTE — Progress Notes (Signed)
Pt states she had a Mirena IUD placed in July on 2011. Has been having pelvic pain, some nausea and had had three POS HPT's over the past month.

## 2011-06-17 NOTE — ED Provider Notes (Signed)
History   Pt presents today c/o lower abd pain and cramping that has been present for greater than 2 months. She states she has had similar problems ever since she had her IUD placed and she wishes to have it removed. She also states that she had 3 positive home preg tests about 2 months ago. She denies vag dc, bleeding, fever, or any other sx at this time.  Chief Complaint  Patient presents with  . Pelvic Pain   HPI  OB History    Grav Para Term Preterm Abortions TAB SAB Ect Mult Living   7 6 6  0 1 1 0 0 0 6      Past Medical History  Diagnosis Date  . Hyperlipidemia   . No pertinent past medical history     Past Surgical History  Procedure Date  . Appendectomy     Family History  Problem Relation Age of Onset  . Heart disease Mother   . Hypertension Mother   . Diabetes Father   . Heart disease Father     History  Substance Use Topics  . Smoking status: Never Smoker   . Smokeless tobacco: Never Used  . Alcohol Use: No    Allergies:  Allergies  Allergen Reactions  . Sulfamethoxazole W/Trimethoprim     REACTION: unspecified    Prescriptions prior to admission  Medication Sig Dispense Refill  . ranitidine (ZANTAC) 300 MG capsule 300 mg. 1 by mouth at bedtime for heart burn       . simvastatin (ZOCOR) 40 MG tablet Take 40 mg by mouth at bedtime.        . SUMAtriptan (IMITREX) 100 MG tablet Take 1 tablet (100 mg total) by mouth every 2 (two) hours as needed for migraine. Do not take more than 2 a day  5 tablet  0  . traMADol (ULTRAM) 50 MG tablet Take 1 tablet (50 mg total) by mouth every 8 (eight) hours as needed for pain.  30 tablet  1    Review of Systems  Constitutional: Negative for fever.  Cardiovascular: Negative for chest pain.  Gastrointestinal: Positive for nausea and abdominal pain. Negative for vomiting, diarrhea and constipation.  Genitourinary: Negative for dysuria, urgency, frequency and hematuria.  Neurological: Negative for dizziness and  headaches.  Psychiatric/Behavioral: Negative for depression and suicidal ideas.   Physical Exam   Blood pressure 164/88, pulse 86, temperature 98.8 F (37.1 C), temperature source Oral, resp. rate 20, height 5' 3.5" (1.613 m), weight 196 lb (88.905 kg), last menstrual period 05/23/2011, SpO2 98.00%.  Physical Exam  Nursing note and vitals reviewed. Constitutional: She is oriented to person, place, and time. She appears well-developed and well-nourished. No distress.  HENT:  Head: Normocephalic and atraumatic.  Eyes: EOM are normal. Pupils are equal, round, and reactive to light.  GI: Soft. She exhibits no distension and no mass. There is no tenderness. There is no rebound and no guarding.  Genitourinary: No bleeding around the vagina. Vaginal discharge found.       IUD strings intact and in the correct position.  Neurological: She is alert and oriented to person, place, and time.  Skin: Skin is warm and dry. She is not diaphoretic.  Psychiatric: She has a normal mood and affect. Her behavior is normal. Judgment and thought content normal.    MAU Course  Procedures  Pt again requests to have her IUD removed. IUD was easily removed using ring forceps. Pt tolerated this procedure well.  Wet prep  and GC/Chlamydia cultures done.  Results for orders placed during the hospital encounter of 06/17/11 (from the past 24 hour(s))  URINALYSIS, ROUTINE W REFLEX MICROSCOPIC     Status: Abnormal   Collection Time   06/17/11  4:10 PM      Component Value Range   Color, Urine YELLOW  YELLOW    Appearance CLEAR  CLEAR    Specific Gravity, Urine >1.030 (*) 1.005 - 1.030    pH 6.0  5.0 - 8.0    Glucose, UA NEGATIVE  NEGATIVE (mg/dL)   Hgb urine dipstick TRACE (*) NEGATIVE    Bilirubin Urine NEGATIVE  NEGATIVE    Ketones, ur NEGATIVE  NEGATIVE (mg/dL)   Protein, ur NEGATIVE  NEGATIVE (mg/dL)   Urobilinogen, UA 0.2  0.0 - 1.0 (mg/dL)   Nitrite NEGATIVE  NEGATIVE    Leukocytes, UA NEGATIVE   NEGATIVE   URINE MICROSCOPIC-ADD ON     Status: Abnormal   Collection Time   06/17/11  4:10 PM      Component Value Range   Squamous Epithelial / LPF FEW (*) RARE    RBC / HPF 0-2  <3 (RBC/hpf)   Bacteria, UA FEW (*) RARE    Urine-Other MUCOUS PRESENT    WET PREP, GENITAL     Status: Abnormal   Collection Time   06/17/11  4:45 PM      Component Value Range   Yeast, Wet Prep NONE SEEN  NONE SEEN    Trich, Wet Prep NONE SEEN  NONE SEEN    Clue Cells, Wet Prep NONE SEEN  NONE SEEN    WBC, Wet Prep HPF POC FEW (*) NONE SEEN   CBC     Status: Normal   Collection Time   06/17/11  4:55 PM      Component Value Range   WBC 7.0  4.0 - 10.5 (K/uL)   RBC 4.40  3.87 - 5.11 (MIL/uL)   Hemoglobin 13.3  12.0 - 15.0 (g/dL)   HCT 40.9  81.1 - 91.4 (%)   MCV 90.7  78.0 - 100.0 (fL)   MCH 30.2  26.0 - 34.0 (pg)   MCHC 33.3  30.0 - 36.0 (g/dL)   RDW 78.2  95.6 - 21.3 (%)   Platelets 229  150 - 400 (K/uL)    Assessment and Plan  Pelvic pain: discussed with pt at length. Will give Rx for anaprox DS. She will f/u with her PCP. Discussed diet, activity, risks, and precautions.  Clinton Gallant. Olney Monier III, DrHSc, MPAS, PA-C  06/17/2011, 4:45 PM   Henrietta Hoover, PA 06/17/11 1711

## 2011-06-17 NOTE — Progress Notes (Signed)
Pt complains of cramping in her pelvic area and a lot pain

## 2011-06-18 LAB — GC/CHLAMYDIA PROBE AMP, GENITAL: GC Probe Amp, Genital: NEGATIVE

## 2011-06-18 NOTE — ED Provider Notes (Signed)
Attestation of Attending Supervision of Advanced Practitioner: Evaluation and management procedures were performed by the PA/NP/CNM/OB Fellow under my supervision/collaboration. Chart reviewed and agree with management and plan.  Derrius Furtick A 06/18/2011 2:52 PM

## 2011-06-19 ENCOUNTER — Encounter (HOSPITAL_COMMUNITY): Payer: Self-pay

## 2011-06-19 ENCOUNTER — Inpatient Hospital Stay (HOSPITAL_COMMUNITY): Payer: Medicaid Other

## 2011-06-19 ENCOUNTER — Inpatient Hospital Stay (HOSPITAL_COMMUNITY)
Admission: AD | Admit: 2011-06-19 | Discharge: 2011-06-20 | Disposition: A | Discharge status: Home / Self Care | Payer: Medicaid Other | Source: Ambulatory Visit | Attending: Obstetrics & Gynecology | Admitting: Obstetrics & Gynecology

## 2011-06-19 DIAGNOSIS — N926 Irregular menstruation, unspecified: Secondary | ICD-10-CM

## 2011-06-19 DIAGNOSIS — N939 Abnormal uterine and vaginal bleeding, unspecified: Secondary | ICD-10-CM

## 2011-06-19 DIAGNOSIS — N949 Unspecified condition associated with female genital organs and menstrual cycle: Secondary | ICD-10-CM | POA: Insufficient documentation

## 2011-06-19 DIAGNOSIS — N938 Other specified abnormal uterine and vaginal bleeding: Secondary | ICD-10-CM | POA: Insufficient documentation

## 2011-06-19 LAB — URINALYSIS, ROUTINE W REFLEX MICROSCOPIC
Ketones, ur: NEGATIVE mg/dL
Protein, ur: NEGATIVE mg/dL
Urobilinogen, UA: 0.2 mg/dL (ref 0.0–1.0)

## 2011-06-19 LAB — URINE MICROSCOPIC-ADD ON

## 2011-06-19 LAB — POCT PREGNANCY, URINE: Preg Test, Ur: NEGATIVE

## 2011-06-19 NOTE — Progress Notes (Signed)
Pt states, " On Wednesday I started having cramping and contraction like pain. I came in and was told that my IUD was down real low and they removed it. I've continued to have this pain and today I'm having having heavier bleeding since 3 am today."

## 2011-06-19 NOTE — ED Provider Notes (Signed)
History     Chief Complaint  Patient presents with  . Abdominal Pain   HPI G7 P 6016 Presents with continued vaginal bleeding and cramping "that feels like preterm labor". Was seen here 2 days ago and had IUD removed. Had + UPT 2 wks ago followed by heavier bleeding and cramping. "I just want to make sure everything is OK".  Has not notified primary doctor.    Past Medical History  Diagnosis Date  . Hyperlipidemia     Past Surgical History  Procedure Date  . Appendectomy     Family History  Problem Relation Age of Onset  . Heart disease Mother   . Hypertension Mother   . Diabetes Father   . Heart disease Father     History  Substance Use Topics  . Smoking status: Never Smoker   . Smokeless tobacco: Never Used  . Alcohol Use: No    Allergies:  Allergies  Allergen Reactions  . Sulfamethoxazole W/Trimethoprim Rash and Other (See Comments)    "Feels shaky"    Prescriptions prior to admission  Medication Sig Dispense Refill  . naproxen sodium (ANAPROX DS) 550 MG tablet Take 1 tablet (550 mg total) by mouth 2 (two) times daily with a meal.  30 tablet  0  . ranitidine (ZANTAC) 300 MG capsule Take 300 mg by mouth daily as needed. 1 by mouth at bedtime for heart burn      . simvastatin (ZOCOR) 40 MG tablet Take 40 mg by mouth at bedtime.       . SUMAtriptan (IMITREX) 100 MG tablet Take 1 tablet (100 mg total) by mouth every 2 (two) hours as needed for migraine. Do not take more than 2 a day  5 tablet  0  . traMADol (ULTRAM) 50 MG tablet Take 1 tablet (50 mg total) by mouth every 8 (eight) hours as needed for pain.  30 tablet  1    Review of Systems  Constitutional: Negative for fever.  Gastrointestinal: Positive for abdominal pain.  Genitourinary: Negative for dysuria.   Physical Exam   Blood pressure 132/87, pulse 82, temperature 98.8 F (37.1 C), temperature source Oral, resp. rate 20, height 5' 3.5" (1.613 m), weight 190 lb (86.183 kg), last menstrual period  05/23/2011.  Physical Exam  Constitutional: She is oriented to person, place, and time. She appears well-developed and well-nourished.  HENT:  Head: Normocephalic.  Respiratory: Effort normal.  GI: Soft. She exhibits no distension and no mass. There is no tenderness. There is no rebound and no guarding.  Genitourinary: Vaginal discharge found.       Small to moderate bleeding  Neurological: She is alert and oriented to person, place, and time.  Skin: Skin is warm and dry.  Psychiatric: She has a normal mood and affect.   CBC    Component Value Date/Time   WBC 7.0 06/17/2011 1655   RBC 4.40 06/17/2011 1655   HGB 13.3 06/17/2011 1655   HCT 39.9 06/17/2011 1655   PLT 229 06/17/2011 1655   MCV 90.7 06/17/2011 1655   MCH 30.2 06/17/2011 1655   MCHC 33.3 06/17/2011 1655   RDW 12.7 06/17/2011 1655   LYMPHSABS 3.0 07/11/2008 1939   MONOABS 0.3 07/11/2008 1939   EOSABS 0.1 07/11/2008 1939   BASOSABS 0.0 07/11/2008 1939     MAU Course  Procedures   Assessment and Plan  Vaginal bleeding and cramping Will check [place] and Quant to assure status of previous positive pregnancy tests  [facility] 06/19/2011,  10:54 PM

## 2011-06-20 LAB — HCG, QUANTITATIVE, PREGNANCY: hCG, Beta Chain, Quant, S: <1 m[IU]/mL (ref <5)

## 2011-06-20 NOTE — ED Provider Notes (Signed)
Beta HCG <1 Not pregnant Nml [place]  Agree with above note.  Katrin Grabel H. 06/20/2011 5:48 AM

## 2011-06-20 NOTE — ED Provider Notes (Signed)
History     Chief Complaint  Patient presents with  . Abdominal Pain   HPI  Care assumed from Shelia Media  Past Medical History  Diagnosis Date  . Hyperlipidemia     Past Surgical History  Procedure Date  . Appendectomy     Family History  Problem Relation Age of Onset  . Heart disease Mother   . Hypertension Mother   . Diabetes Father   . Heart disease Father     History  Substance Use Topics  . Smoking status: Never Smoker   . Smokeless tobacco: Never Used  . Alcohol Use: No    Allergies:  Allergies  Allergen Reactions  . Sulfamethoxazole W/Trimethoprim Rash and Other (See Comments)    "Feels shaky"    Prescriptions prior to admission  Medication Sig Dispense Refill  . naproxen sodium (ANAPROX DS) 550 MG tablet Take 1 tablet (550 mg total) by mouth 2 (two) times daily with a meal.  30 tablet  0  . ranitidine (ZANTAC) 300 MG capsule Take 300 mg by mouth daily as needed. 1 by mouth at bedtime for heart burn      . simvastatin (ZOCOR) 40 MG tablet Take 40 mg by mouth at bedtime.       . SUMAtriptan (IMITREX) 100 MG tablet Take 1 tablet (100 mg total) by mouth every 2 (two) hours as needed for migraine. Do not take more than 2 a day  5 tablet  0  . traMADol (ULTRAM) 50 MG tablet Take 1 tablet (50 mg total) by mouth every 8 (eight) hours as needed for pain.  30 tablet  1    ROS Physical Exam   Blood pressure 132/87, pulse 82, temperature 98.8 F (37.1 C), temperature source Oral, resp. rate 20, height 5' 3.5" (1.613 m), weight 190 lb (86.183 kg), last menstrual period 05/23/2011.  Physical Exam  MAU Course  Procedures Uterus: Normal in size and appearance; measures 10.0 x 5.3 x 5.7  cm. The patient's intrauterine device has been removed.  Endometrium: Normal in thickness and appearance; measures 0.4 cm in  thickness.  Right ovary: Normal appearance/no adnexal mass; measures 3.4 x 2.1  x 1.7 cm.  Left ovary: Normal appearance/no adnexal mass;  measures 3.9 x 1.6 x  2.0 cm.  Other findings: No free fluid seen within the pelvic cul-de-sac.  IMPRESSION:  Normal study. No evidence of pelvic mass or other significant  abnormality.  Original Report Authenticated By: Tonia Ghent, M.D.     HCG<1 CBC GC/Chlamdyia-pending Urinalysis- normal      Assessment and Plan  Abnormal uterine bleeding with normal ultrasound findings Pt will follow up with Family Medicine  Haley Mclaughlin 06/20/2011, 12:22 AM

## 2011-07-01 ENCOUNTER — Ambulatory Visit: Payer: Medicaid Other | Admitting: Family Medicine

## 2011-07-02 ENCOUNTER — Emergency Department: Payer: Self-pay | Admitting: *Deleted

## 2011-07-07 ENCOUNTER — Ambulatory Visit (INDEPENDENT_AMBULATORY_CARE_PROVIDER_SITE_OTHER): Payer: Medicaid Other | Admitting: Family Medicine

## 2011-07-07 ENCOUNTER — Encounter: Payer: Self-pay | Admitting: Family Medicine

## 2011-07-07 DIAGNOSIS — K219 Gastro-esophageal reflux disease without esophagitis: Secondary | ICD-10-CM

## 2011-07-07 DIAGNOSIS — R109 Unspecified abdominal pain: Secondary | ICD-10-CM | POA: Insufficient documentation

## 2011-07-07 MED ORDER — OMEPRAZOLE 20 MG PO TBEC: 1.0000 | DELAYED_RELEASE_TABLET | Freq: Every day | ORAL | Status: DC

## 2011-07-07 NOTE — Progress Notes (Signed)
  Subjective:    Patient ID: Haley Mclaughlin, female    DOB: 09/18/1965, 45 y.o.   MRN: [id number]  HPI 45 year old female coming in with abdominal pain. Patient states that she has had this pain on and off for approximately the last 2 weeks. During the last 2 days this pain has seemed to be constant. Patient states that most the pain is in the right upper quadrant as well as the mid epigastric region. Patient states that she does have some associated nausea but denies any type of vomiting. Patient denies any association with food that she can recall patient denies any type of change in bowel habits or color. Patient denies any radiation of the pain denies any type of dysuria or hematuria denies any back pain. Significant past medical history includes gastroesophageal reflux disease   Review of Systems As stated in the history of present illness     Objective:   Physical Exam General: No apparent distress minorly overweight white female alert Cardiovascular regular rate and rhythm no murmur Pulmonary: Clear to auscultation bilaterally no pain with deep inspiration Abdomen: On inspection no gross deformities on palpation patient is significantly tender to palpation in the right upper quadrant as well as the mid epigastric region even with light palpation. Patient does seem to have a positive McMurray's sign no organomegaly felt no CVA tenderness.  Bowel sounds positive in all 4 quadrants.  Extremities: 2+ no edema    Assessment & Plan:

## 2011-07-07 NOTE — Patient Instructions (Addendum)
Nice to meet you. I am going to get an abdominal ultrasound to make sure you're gallbladders okay. I when she to stop taking the Zantac and I will give you a stronger medicine called omeprazole. Take one pill daily For the pain I would like you to take Tylenol 650 mg 3 times a day I when she to consider the your contraceptive medications I when she to come back in 2 weeks to make sure doing better

## 2011-07-07 NOTE — Assessment & Plan Note (Signed)
Patient is mostly tender in the midepigastric region. Patient though does have some right upper quadrant tenderness and a positive McMurray sign. Patient does have risk factors for gallbladder disease including female overweight and in her 11s. We will get a right upper quadrant ultrasound to rule out any type of gallstone formation echo because of the pain. The more likely differential includes worsening of her gastroesophageal reflux disease. Patient has been taking Zantac on a regular basis but we will increase her to omeprazole and see if this improves her symptoms. Patient will come back in one to 2 weeks for reevaluation patient given red flags and what to look out for and when to seek medical attention

## 2011-07-10 ENCOUNTER — Ambulatory Visit (HOSPITAL_COMMUNITY)
Admission: RE | Admit: 2011-07-10 | Discharge: 2011-07-10 | Disposition: A | Discharge status: Home / Self Care | Payer: Medicaid Other | Source: Ambulatory Visit | Attending: Family Medicine | Admitting: Family Medicine

## 2011-07-10 DIAGNOSIS — R1011 Right upper quadrant pain: Secondary | ICD-10-CM | POA: Insufficient documentation

## 2011-07-10 DIAGNOSIS — R109 Unspecified abdominal pain: Secondary | ICD-10-CM

## 2011-07-10 DIAGNOSIS — R1013 Epigastric pain: Secondary | ICD-10-CM | POA: Insufficient documentation

## 2011-09-28 ENCOUNTER — Emergency Department (HOSPITAL_COMMUNITY): Payer: Medicaid Other

## 2011-09-28 ENCOUNTER — Emergency Department (HOSPITAL_COMMUNITY)
Admission: EM | Admit: 2011-09-28 | Discharge: 2011-09-28 | Disposition: A | Discharge status: Home / Self Care | Payer: Medicaid Other | Attending: Emergency Medicine | Admitting: Emergency Medicine

## 2011-09-28 ENCOUNTER — Encounter (HOSPITAL_COMMUNITY): Payer: Self-pay | Admitting: *Deleted

## 2011-09-28 DIAGNOSIS — S63501A Unspecified sprain of right wrist, initial encounter: Secondary | ICD-10-CM

## 2011-09-28 DIAGNOSIS — M25539 Pain in unspecified wrist: Secondary | ICD-10-CM | POA: Insufficient documentation

## 2011-09-28 DIAGNOSIS — W010XXA Fall on same level from slipping, tripping and stumbling without subsequent striking against object, initial encounter: Secondary | ICD-10-CM | POA: Insufficient documentation

## 2011-09-28 DIAGNOSIS — S63509A Unspecified sprain of unspecified wrist, initial encounter: Secondary | ICD-10-CM | POA: Insufficient documentation

## 2011-09-28 DIAGNOSIS — E785 Hyperlipidemia, unspecified: Secondary | ICD-10-CM | POA: Insufficient documentation

## 2011-09-28 DIAGNOSIS — M79609 Pain in unspecified limb: Secondary | ICD-10-CM | POA: Insufficient documentation

## 2011-09-28 MED ORDER — IBUPROFEN 800 MG PO TABS: 800.0000 mg | ORAL_TABLET | Freq: Three times a day (TID) | ORAL | Status: AC

## 2011-09-28 NOTE — ED Provider Notes (Signed)
History     CSN: [id number]  Arrival date & time 09/28/11  2058   First MD Initiated Contact with Patient 09/28/11 2158      Chief Complaint  Patient presents with  . Hand Pain    (Consider location/radiation/quality/duration/timing/severity/associated sxs/prior treatment) Patient is a 46 y.o. female presenting with wrist pain. The history is provided by the patient.  Wrist Pain This is a new problem. The current episode started in the past 7 days. The problem occurs constantly. The problem has been unchanged. Pertinent negatives include no joint swelling, numbness or weakness. The symptoms are aggravated by bending. She has tried immobilization, NSAIDs, rest and ice for the symptoms.   She fell on her outstretched R hand on the ice on Fri or Sat and has had pain since then. Has tried NSAIDs, ice, immobilization in ace bandage with no relief. Pain located mostly over the ulnar aspect of the wrist and worsens with movement; improves with keeping the wrist completely still. Denies numbness/weakness in the wrist.  Past Medical History  Diagnosis Date  . Hyperlipidemia     Past Surgical History  Procedure Date  . Appendectomy     Family History  Problem Relation Age of Onset  . Heart disease Mother   . Hypertension Mother   . Diabetes Father   . Heart disease Father     History  Substance Use Topics  . Smoking status: Never Smoker   . Smokeless tobacco: Never Used  . Alcohol Use: No    OB History    Grav Para Term Preterm Abortions TAB SAB Ect Mult Living   7 6 6  0 1 1 0 0 0 6      Review of Systems  Constitutional: Negative.   Musculoskeletal: Negative for joint swelling.  Skin: Negative for wound.  Neurological: Negative for weakness and numbness.    Allergies  Sulfamethoxazole w/trimethoprim  Home Medications   Current Outpatient Rx  Name Route Sig Dispense Refill  . SIMVASTATIN 40 MG PO TABS Oral Take 40 mg by mouth at bedtime.       BP 142/84   Pulse 92  Temp(Src) 98.3 F (36.8 C) (Oral)  Resp 19  SpO2 94%  LMP 09/19/2011  Physical Exam  Vitals reviewed. Constitutional: She is oriented to person, place, and time. She appears well-developed and well-nourished. No distress.  HENT:  Head: Normocephalic and atraumatic.  Neck: Normal range of motion.  Musculoskeletal:       Right hand: She exhibits normal range of motion, no tenderness, no bony tenderness, normal capillary refill, no deformity and no swelling. normal sensation noted. Decreased sensation is not present in the ulnar distribution, is not present in the medial distribution and is not present in the radial distribution. She exhibits no finger abduction.       Tender to palp over distal ulnar area without swelling or palp deformity. FROM and strength at wrist. Hand neurovasc intact with sensory intact to lt touch in radial, median, ulnar dist. Cap refill <3.  Neurological: She is alert and oriented to person, place, and time.  Skin: Skin is warm and dry. She is not diaphoretic.  Psychiatric: She has a normal mood and affect.    ED Course  Procedures (including critical care time)  Labs Reviewed - No data to display Dg Wrist Complete Right  09/28/2011  *RADIOLOGY REPORT*  Clinical Data: Right wrist pain and swelling post fall  RIGHT WRIST - COMPLETE 3+ VIEW  Comparison: 05/06/2011  Findings:  Small degenerative cyst within trapezium. Osseous mineralization otherwise normal. Joint spaces preserved. No acute fracture, dislocation, or bone destruction.  IMPRESSION: No acute osseous abnormalities.  Original Report Authenticated By: Lollie Marrow, M.D.  I personally reviewed the xrays.   1. Right wrist sprain       MDM  Pt with FOOSH 3-4 days ago. Negative plain films. Will place in wrist splint for immobilization. Pt advised to continue RICE. Return precautions discussed.        Grant Fontana, [place] 09/29/11 [id number]

## 2011-09-28 NOTE — Progress Notes (Signed)
Orthopedic Tech Progress Note Patient Details:  Haley Mclaughlin Feb 19, 1966 [id number]  Type of Splint: Other (comment) (velcro wrist splint) Splint Location: right wrist Splint Interventions: Application    Nikki Dom 09/28/2011, 11:07 PM

## 2011-09-28 NOTE — ED Notes (Signed)
She fell in the ice 4 days ago and she has had rt hand and arm pain since then

## 2011-09-30 NOTE — ED Provider Notes (Signed)
Medical screening examination/treatment/procedure(s) were performed by non-physician practitioner and as supervising physician I was immediately available for consultation/collaboration.   Myracle Febres L Geniyah Eischeid, MD 09/30/11 2307 

## 2011-10-29 ENCOUNTER — Emergency Department: Payer: Self-pay | Admitting: Emergency Medicine

## 2011-10-31 ENCOUNTER — Emergency Department: Payer: Self-pay | Admitting: Emergency Medicine

## 2011-11-17 ENCOUNTER — Ambulatory Visit (INDEPENDENT_AMBULATORY_CARE_PROVIDER_SITE_OTHER): Payer: Medicaid Other | Admitting: Family Medicine

## 2011-11-17 ENCOUNTER — Encounter: Payer: Self-pay | Admitting: Family Medicine

## 2011-11-17 VITALS — BP 129/77 | HR 81 | Temp 98.0°F | Ht 63.5 in | Wt 195.0 lb

## 2011-11-17 DIAGNOSIS — M7710 Lateral epicondylitis, unspecified elbow: Secondary | ICD-10-CM

## 2011-11-17 NOTE — Patient Instructions (Signed)
We will refer you to the [facility] clinic in [place] for evaluation Please stop the motrin if you have any stomach irritation

## 2011-11-17 NOTE — Progress Notes (Signed)
  Subjective:    Patient ID: Haley Mclaughlin, female    DOB: Jan 22, 1966, 46 y.o.   MRN: [id number]  HPI  Pt with left elbow pain and burning sensation x 2 weeks.  This started when she was working out.  She was seen in the [facility] ed and was told to wear a sling and f/u with orthopedics.  She wore the sling for 10 days with no improvement and has been taking motrin.  She is here today for referral to ortho.    She feels like her grip is weak due to pain.  Review of Systems No CP. SOB, skin changes    Objective:   Physical Exam Vital signs reviewed General appearance - alert, well appearing, and in no distress and oriented to person, place, and time MSK- left arm without muscle wasting or skin change.  Moderate pain on palpation of elbow.  With encouragement, normal grip and bicep strength.       Assessment & Plan:

## 2011-11-17 NOTE — Assessment & Plan Note (Signed)
2 weeks of pain lateral left elbow with burning and weakness due to pain.  Will continue the plan from the ED to refer to ortho for evaluation.

## 2011-12-02 ENCOUNTER — Ambulatory Visit: Payer: Medicaid Other | Attending: Orthopedic Surgery

## 2011-12-02 DIAGNOSIS — IMO0001 Reserved for inherently not codable concepts without codable children: Secondary | ICD-10-CM | POA: Insufficient documentation

## 2011-12-02 DIAGNOSIS — M25539 Pain in unspecified wrist: Secondary | ICD-10-CM | POA: Insufficient documentation

## 2011-12-30 ENCOUNTER — Inpatient Hospital Stay (HOSPITAL_COMMUNITY): Payer: Medicaid Other

## 2011-12-30 ENCOUNTER — Encounter (HOSPITAL_COMMUNITY): Payer: Self-pay | Admitting: *Deleted

## 2011-12-30 ENCOUNTER — Inpatient Hospital Stay (HOSPITAL_COMMUNITY)
Admission: AD | Admit: 2011-12-30 | Discharge: 2011-12-30 | Disposition: A | Discharge status: Home / Self Care | Payer: Medicaid Other | Source: Ambulatory Visit | Attending: Obstetrics and Gynecology | Admitting: Obstetrics and Gynecology

## 2011-12-30 DIAGNOSIS — O26899 Other specified pregnancy related conditions, unspecified trimester: Secondary | ICD-10-CM

## 2011-12-30 DIAGNOSIS — O99891 Other specified diseases and conditions complicating pregnancy: Secondary | ICD-10-CM | POA: Insufficient documentation

## 2011-12-30 DIAGNOSIS — R109 Unspecified abdominal pain: Secondary | ICD-10-CM

## 2011-12-30 LAB — CBC
MCH: 29.7 pg (ref 26.0–34.0)
MCHC: 33.2 g/dL (ref 30.0–36.0)
Platelets: 240 10*3/uL (ref 150–400)
RBC: 4.55 MIL/uL (ref 3.87–5.11)

## 2011-12-30 LAB — WET PREP, GENITAL
Trich, Wet Prep: NONE SEEN
Yeast Wet Prep HPF POC: NONE SEEN

## 2011-12-30 LAB — URINALYSIS, ROUTINE W REFLEX MICROSCOPIC
Bilirubin Urine: NEGATIVE
Ketones, ur: NEGATIVE mg/dL
Nitrite: NEGATIVE
Protein, ur: NEGATIVE mg/dL
pH: 7 (ref 5.0–8.0)

## 2011-12-30 LAB — POCT PREGNANCY, URINE: Preg Test, Ur: NEGATIVE

## 2011-12-30 LAB — ABO/RH: ABO/RH(D): A POS

## 2011-12-30 NOTE — MAU Provider Note (Signed)
History     CSN: [id number]  Arrival date and time: 12/30/11 1551   None     Chief Complaint  Patient presents with  . Abdominal Pain   HPI 46 y.o. [id number] at Unknown EGA. 3 positive UPTs at home, cramping x a few days, no bleeding. Periods q 2 months. Patient's last menstrual period was 12/02/2011.   Past Medical History  Diagnosis Date  . Hyperlipidemia     Past Surgical History  Procedure Date  . Appendectomy     Family History  Problem Relation Age of Onset  . Heart disease Mother   . Hypertension Mother   . Diabetes Father   . Heart disease Father   . Anesthesia problems Neg Hx     History  Substance Use Topics  . Smoking status: Never Smoker   . Smokeless tobacco: Never Used  . Alcohol Use: No    Allergies:  Allergies  Allergen Reactions  . Sulfamethoxazole W-Trimethoprim Rash and Other (See Comments)    "Feels shaky"    No prescriptions prior to admission    Review of Systems  Constitutional: Negative.   Respiratory: Negative.   Cardiovascular: Negative.   Gastrointestinal: Positive for abdominal pain. Negative for nausea, vomiting, diarrhea and constipation.  Genitourinary: Negative for dysuria, urgency, frequency, hematuria and flank pain.       Negative for vaginal bleeding, vaginal discharge  Musculoskeletal: Negative.   Neurological: Negative.   Psychiatric/Behavioral: Negative.    Physical Exam   Blood pressure 141/70, pulse 77, temperature 98.4 F (36.9 C), temperature source Oral, resp. rate 20, height 5\' 4"  (1.626 m), weight 190 lb (86.183 kg), last menstrual period 12/02/2011, unknown if currently breastfeeding.  Physical Exam  Nursing note and vitals reviewed. Constitutional: She is oriented to person, place, and time. She appears well-developed and well-nourished. No distress.  HENT:  Head: Normocephalic and atraumatic.  Cardiovascular: Normal rate, regular rhythm and normal heart sounds.   Respiratory: Effort normal and  breath sounds normal. No respiratory distress.  GI: Soft. Bowel sounds are normal. She exhibits no distension and no mass. There is no tenderness. There is no rebound and no guarding.  Genitourinary: There is no rash, tenderness or lesion on the right labia. There is no rash, tenderness or lesion on the left labia. Uterus is not deviated, not enlarged and not fixed. Cervix exhibits no motion tenderness, no discharge and no friability. Right adnexum displays no mass and no fullness. Left adnexum displays no mass and no fullness. No erythema, tenderness or bleeding around the vagina. Vaginal discharge (white, nonmalodorous) found.       Diffuse tenderness on exam  Neurological: She is alert and oriented to person, place, and time.  Skin: Skin is warm and dry.  Psychiatric: She has a normal mood and affect.    MAU Course  Procedures  Results for orders placed during the hospital encounter of 12/30/11 (from the past 72 hour(s))  URINALYSIS, ROUTINE W REFLEX MICROSCOPIC     Status: Normal   Collection Time   12/30/11  4:00 PM      Component Value Range Comment   Color, Urine YELLOW  YELLOW     APPearance CLEAR  CLEAR     Specific Gravity, Urine 1.010  1.005 - 1.030     pH 7.0  5.0 - 8.0     Glucose, UA NEGATIVE  NEGATIVE (mg/dL)    Hgb urine dipstick NEGATIVE  NEGATIVE     Bilirubin Urine NEGATIVE  NEGATIVE  Ketones, ur NEGATIVE  NEGATIVE (mg/dL)    Protein, ur NEGATIVE  NEGATIVE (mg/dL)    Urobilinogen, UA 0.2  0.0 - 1.0 (mg/dL)    Nitrite NEGATIVE  NEGATIVE     Leukocytes, UA NEGATIVE  NEGATIVE  MICROSCOPIC NOT DONE ON URINES WITH NEGATIVE PROTEIN, BLOOD, LEUKOCYTES, NITRITE, OR GLUCOSE <1000 mg/dL.  POCT PREGNANCY, URINE     Status: Normal   Collection Time   12/30/11  4:04 PM      Component Value Range Comment   Preg Test, Ur NEGATIVE  NEGATIVE    HCG, QUANTITATIVE, PREGNANCY     Status: Abnormal   Collection Time   12/30/11  4:20 PM      Component Value Range Comment   hCG, Beta  Chain, Quant, S 42 (*) <5 (mIU/mL)   WET PREP, GENITAL     Status: Abnormal   Collection Time   12/30/11  4:30 PM      Component Value Range Comment   Yeast Wet Prep HPF POC NONE SEEN  NONE SEEN     Trich, Wet Prep NONE SEEN  NONE SEEN     Clue Cells Wet Prep HPF POC NONE SEEN  NONE SEEN     WBC, Wet Prep HPF POC FEW (*) NONE SEEN  MANY BACTERIA SEEN  CBC     Status: Normal   Collection Time   12/30/11  5:23 PM      Component Value Range Comment   WBC 7.7  4.0 - 10.5 (K/uL)    RBC 4.55  3.87 - 5.11 (MIL/uL)    Hemoglobin 13.5  12.0 - 15.0 (g/dL)    HCT 16.1  09.6 - 04.5 (%)    MCV 89.5  78.0 - 100.0 (fL)    MCH 29.7  26.0 - 34.0 (pg)    MCHC 33.2  30.0 - 36.0 (g/dL)    RDW 40.9  81.1 - 91.4 (%)    Platelets 240  150 - 400 (K/uL)   ABO/RH     Status: Normal (Preliminary result)   Collection Time   12/30/11  5:23 PM      Component Value Range Comment   ABO/RH(D) A POS      US Ob Comp Less 14 Wks  12/30/2011  *RADIOLOGY REPORT*  Clinical Data: Abdominal pain and cramping  OBSTETRIC <14 WK [place] AND TRANSVAGINAL OB US  Technique:  Both transabdominal and transvaginal ultrasound examinations were performed for complete evaluation of the gestation as well as the maternal uterus, adnexal regions, and pelvic cul-de-sac.  Transvaginal technique was performed to assess early pregnancy.  Comparison:  Pelvic ultrasound 06/19/2011  Intrauterine gestational sac:  Not visualized Yolk sac: Not visualized Embryo: Not visualized  Maternal uterus/adnexae: Normal ovaries.  Trace free fluid.  IMPRESSION: No intrauterine gestational sac identified.  Differential diagnosis includes early uterine pregnancy, spontaneous abortion, and ectopic pregnancy.  Recommend follow-up ultrasound imaging .  Original Report Authenticated By: Genevive Bi, M.D.   US Ob Transvaginal  12/30/2011  *RADIOLOGY REPORT*  Clinical Data: Abdominal pain and cramping  OBSTETRIC <14 WK [place] AND TRANSVAGINAL OB US  Technique:  Both transabdominal  and transvaginal ultrasound examinations were performed for complete evaluation of the gestation as well as the maternal uterus, adnexal regions, and pelvic cul-de-sac.  Transvaginal technique was performed to assess early pregnancy.  Comparison:  Pelvic ultrasound 06/19/2011  Intrauterine gestational sac:  Not visualized Yolk sac: Not visualized Embryo: Not visualized  Maternal uterus/adnexae: Normal ovaries.  Trace free fluid.  IMPRESSION: No intrauterine gestational sac identified.  Differential diagnosis includes early uterine pregnancy, spontaneous abortion, and ectopic pregnancy.  Recommend follow-up ultrasound imaging .  Original Report Authenticated By: Genevive Bi, M.D.    Assessment and Plan  46 y.o. [id number] at [redacted]w[redacted]d with low abd pain in early pregnancy F/U in 48 hours for repeat quant HCG Precautions rev'd  Aayan Haskew 12/30/2011, 6:50 PM

## 2011-12-30 NOTE — MAU Note (Addendum)
Pt found out yesterday that she was pregnant due to abdominal cramping and took  3 home preg test, all positive.  States cramping started 4 days ago.  Denies any bleeding.  LMP 12/02/11, but was not normal.

## 2012-01-01 DIAGNOSIS — Z09 Encounter for follow-up examination after completed treatment for conditions other than malignant neoplasm: Secondary | ICD-10-CM

## 2012-01-02 ENCOUNTER — Inpatient Hospital Stay (HOSPITAL_COMMUNITY)
Admission: AD | Admit: 2012-01-02 | Discharge: 2012-01-02 | Disposition: A | Discharge status: Home / Self Care | Payer: Medicaid Other | Source: Ambulatory Visit | Attending: Obstetrics & Gynecology | Admitting: Obstetrics & Gynecology

## 2012-01-02 DIAGNOSIS — R109 Unspecified abdominal pain: Secondary | ICD-10-CM | POA: Insufficient documentation

## 2012-01-02 DIAGNOSIS — Z09 Encounter for follow-up examination after completed treatment for conditions other than malignant neoplasm: Secondary | ICD-10-CM

## 2012-01-02 DIAGNOSIS — O99891 Other specified diseases and conditions complicating pregnancy: Secondary | ICD-10-CM | POA: Insufficient documentation

## 2012-01-02 NOTE — MAU Note (Signed)
Pt here for repeat BHCG. Reports mild cramping only no vaginal bleeding

## 2012-01-02 NOTE — MAU Provider Note (Signed)
Haley Mclaughlin is a 46 y.o. female @ [redacted]w[redacted]d gestation who presents to MAU for follow. On her previous visit her Bhcg was 42, her blood type is A positive. Her ultrasound showed no UP or adnexal mass. Today her Bhcg is 182. She denies pain or bleeding today.   Results for orders placed during the hospital encounter of 01/02/12 (from the past 24 hour(s))  HCG, QUANTITATIVE, PREGNANCY     Status: Abnormal   Collection Time   01/02/12  9:54 AM      Component Value Range   hCG, Beta Chain, Quant, S 182 (*) <5 (mIU/mL)   Assessment: Normal rise in Bhcg  Plan:  Return in 48 hours for follow up. If normal rise again will schedule for follow up ultrasound in 7 to 10 days.   Discussed in detail with patient ectopic precautions and need for immediate follow up if any problems.   Patient voices understanding.

## 2012-01-02 NOTE — Discharge Instructions (Signed)
Return in 2 days for lab work. Return immediately for problems. Continue strict ectopic precautions as discussed at previous visit and today.

## 2012-01-04 ENCOUNTER — Inpatient Hospital Stay (HOSPITAL_COMMUNITY)
Admission: AD | Admit: 2012-01-04 | Discharge: 2012-01-04 | Disposition: A | Discharge status: Home / Self Care | Payer: Medicaid Other | Source: Ambulatory Visit | Attending: Obstetrics & Gynecology | Admitting: Obstetrics & Gynecology

## 2012-01-04 DIAGNOSIS — Z09 Encounter for follow-up examination after completed treatment for conditions other than malignant neoplasm: Secondary | ICD-10-CM

## 2012-01-04 DIAGNOSIS — O26859 Spotting complicating pregnancy, unspecified trimester: Secondary | ICD-10-CM | POA: Insufficient documentation

## 2012-01-04 NOTE — MAU Provider Note (Signed)
Attestation of Attending Supervision of Advanced Practitioner: Evaluation and management procedures were performed by the [facility] Fellow/PA/CNM/NP under my supervision and collaboration. Chart reviewed, and agree with management and plan.  Jaynie Collins, M.D. 01/04/2012 9:41 AM

## 2012-01-04 NOTE — MAU Note (Signed)
Patient to MAU for repeat BHCG. Patient denies any pain or bleeding today. Did have a little spotting and one sharp pain on 5-4.

## 2012-01-04 NOTE — MAU Provider Note (Signed)
Haley Mclaughlin is a 46 y.o. female who presents to MAU for follow up Bhcg. She was evaluated 5/1 and her Bhcg was 42. She returned 5/3 and it increased to 182. She returns today and it is 563. She reports no pain or bleeding.  Will schedule follow up ultrasound for one week. She will continue ectopic precautions until follow up. VS T 98.2, P 85, R 16, BP 126/68 I have reviewed this patient's vital signs, nurses notes, appropriate labs.

## 2012-01-04 NOTE — Discharge Instructions (Signed)
Return in one week for follow up ultrasound. Return sooner for any problems. NO sex No tampons, nothing in the vagina until follow up.

## 2012-01-04 NOTE — MAU Provider Note (Signed)
Agree with above note.  Haley Mclaughlin 01/04/2012 2:12 PM   

## 2012-01-11 ENCOUNTER — Encounter (HOSPITAL_COMMUNITY): Payer: Self-pay | Admitting: *Deleted

## 2012-01-11 ENCOUNTER — Inpatient Hospital Stay (HOSPITAL_COMMUNITY)
Admission: AD | Admit: 2012-01-11 | Discharge: 2012-01-11 | Disposition: A | Discharge status: Home / Self Care | Payer: Medicaid Other | Source: Ambulatory Visit | Attending: Obstetrics & Gynecology | Admitting: Obstetrics & Gynecology

## 2012-01-11 ENCOUNTER — Inpatient Hospital Stay (HOSPITAL_COMMUNITY): Payer: Medicaid Other

## 2012-01-11 DIAGNOSIS — R109 Unspecified abdominal pain: Secondary | ICD-10-CM | POA: Insufficient documentation

## 2012-01-11 DIAGNOSIS — O99891 Other specified diseases and conditions complicating pregnancy: Secondary | ICD-10-CM | POA: Insufficient documentation

## 2012-01-11 DIAGNOSIS — Z3490 Encounter for supervision of normal pregnancy, unspecified, unspecified trimester: Secondary | ICD-10-CM

## 2012-01-11 HISTORY — DX: Urinary tract infection, site not specified: N39.0

## 2012-01-11 HISTORY — DX: Gestational diabetes mellitus in pregnancy, unspecified control: O24.419

## 2012-01-11 HISTORY — DX: Unspecified abnormal cytological findings in specimens from cervix uteri: R87.619

## 2012-01-11 HISTORY — DX: Reserved for concepts with insufficient information to code with codable children: IMO0002

## 2012-01-11 HISTORY — DX: Female pelvic inflammatory disease, unspecified: N73.9

## 2012-01-11 HISTORY — DX: Hyperlipidemia, unspecified: E78.5

## 2012-01-11 NOTE — MAU Note (Signed)
Doing ok, int cramping, stabbing on lower left side.  No bleeding or d/c.

## 2012-01-11 NOTE — Progress Notes (Signed)
Haley Mclaughlin is a 46 y.o. female who presents to MAU for follow up ultrasound. She was evaluated 5/1 and her Bhcg was 42. She returned 5/3 and it increased to 182. She returns on 5/5 and it was 563. She reports no pain or bleeding. Here today for follow-up ultrasound. BP 119/72  Pulse 82  Temp(Src) 97 F (36.1 C) (Oral)  Resp 20  Ht 5\' 4"  (1.626 m)  Wt 89.812 kg (198 lb)  BMI 33.99 kg/m2  LMP 12/02/2011  Breastfeeding? Unknown  I have reviewed this patient's vital signs, nurses notes, appropriate results.  Pt instructed to begin prenatal care and to return for bleeding or pain. [facility]

## 2012-01-27 ENCOUNTER — Encounter: Payer: Self-pay | Admitting: Family Medicine

## 2012-01-27 ENCOUNTER — Ambulatory Visit (INDEPENDENT_AMBULATORY_CARE_PROVIDER_SITE_OTHER): Payer: Medicaid Other | Admitting: Family Medicine

## 2012-01-27 VITALS — BP 126/81 | HR 85 | Temp 98.2°F | Ht 64.0 in | Wt 200.0 lb

## 2012-01-27 DIAGNOSIS — E7800 Pure hypercholesterolemia, unspecified: Secondary | ICD-10-CM

## 2012-01-27 NOTE — Assessment & Plan Note (Signed)
Uncontrolled.  Would certainly not treat with statins during pregnancy.  After delivery if she has a reliable contraceptive method would likely benefit from statin given very high LDL and strong family history for coronary artery disease

## 2012-01-27 NOTE — Progress Notes (Signed)
  Subjective:    Patient ID: Haley Mclaughlin, female    DOB: May 27, 1966, 46 y.o.   MRN: [id number]  HPI  Cholesterol Is now [redacted] weeks pregnant.  Wondering what to do about her high cholesterol.  Has taken lipitor intermittently in the past.  Last about 3 months ago.   No personal history of coronary artery disease but strong fhx (sister had CVA in 6s and father coronary artery disease in his 12s)  No chest pain with exertion and no claudication.  Does have frequent palpitations.  Has taken asa in the past    Review of Systems     Objective:   Physical Exam  Heart - Regular rate and rhythm.  No murmurs, gallops or rubs.    Lungs:  Normal respiratory effort, chest expands symmetrically. Lungs are clear to auscultation, no crackles or wheezes. Extremities:  No cyanosis, edema, or deformity noted with good range of motion of all major joints.         Assessment & Plan:

## 2012-01-27 NOTE — Patient Instructions (Signed)
Follow up with your OB doctors as scheduled  I will be happy to talk with your doctors as needed  We can discuss treatment of your cholesterol after you deliver

## 2012-01-28 ENCOUNTER — Encounter: Payer: Self-pay | Admitting: Obstetrics and Gynecology

## 2012-01-28 LAB — TSH: Thyroid Stimulating Horm: 1.2 u[IU]/mL

## 2012-02-05 ENCOUNTER — Emergency Department: Payer: Self-pay | Admitting: Unknown Physician Specialty

## 2012-02-05 LAB — URINALYSIS, COMPLETE
Bilirubin,UR: NEGATIVE
Blood: NEGATIVE
Ketone: NEGATIVE
Leukocyte Esterase: NEGATIVE
Nitrite: NEGATIVE
Ph: 6 (ref 4.5–8.0)
Protein: NEGATIVE
Specific Gravity: 1.02 (ref 1.003–1.030)
Squamous Epithelial: 8
WBC UR: 3 /HPF (ref 0–5)

## 2012-02-05 LAB — COMPREHENSIVE METABOLIC PANEL
Albumin: 3.6 g/dL (ref 3.4–5.0)
BUN: 8 mg/dL (ref 7–18)
Chloride: 106 mmol/L (ref 98–107)
Creatinine: 0.75 mg/dL (ref 0.60–1.30)
EGFR (African American): >60
Glucose: 153 mg/dL — ABNORMAL HIGH (ref 65–99)
Potassium: 3.6 mmol/L (ref 3.5–5.1)
SGPT (ALT): 18 U/L
Total Protein: 7.4 g/dL (ref 6.4–8.2)

## 2012-02-05 LAB — CBC
HGB: 13.5 g/dL (ref 12.0–16.0)
MCH: 30.5 pg (ref 26.0–34.0)
MCHC: 33.8 g/dL (ref 32.0–36.0)
Platelet: 225 10*3/uL (ref 150–440)
RDW: 13.8 % (ref 11.5–14.5)
WBC: 8.5 10*3/uL (ref 3.6–11.0)

## 2012-02-05 LAB — LIPASE, BLOOD: Lipase: 172 U/L (ref 73–393)

## 2012-02-08 ENCOUNTER — Inpatient Hospital Stay (HOSPITAL_COMMUNITY): Payer: Medicaid Other

## 2012-02-08 ENCOUNTER — Encounter (HOSPITAL_COMMUNITY): Payer: Self-pay | Admitting: *Deleted

## 2012-02-08 ENCOUNTER — Inpatient Hospital Stay (HOSPITAL_COMMUNITY)
Admission: AD | Admit: 2012-02-08 | Discharge: 2012-02-09 | Disposition: A | Discharge status: Home / Self Care | Payer: Medicaid Other | Source: Ambulatory Visit | Attending: Obstetrics and Gynecology | Admitting: Obstetrics and Gynecology

## 2012-02-08 DIAGNOSIS — O021 Missed abortion: Secondary | ICD-10-CM

## 2012-02-08 DIAGNOSIS — R296 Repeated falls: Secondary | ICD-10-CM | POA: Insufficient documentation

## 2012-02-08 DIAGNOSIS — Y93K1 Activity, walking an animal: Secondary | ICD-10-CM | POA: Insufficient documentation

## 2012-02-08 DIAGNOSIS — O99891 Other specified diseases and conditions complicating pregnancy: Secondary | ICD-10-CM | POA: Insufficient documentation

## 2012-02-08 LAB — URINALYSIS, ROUTINE W REFLEX MICROSCOPIC
Glucose, UA: NEGATIVE mg/dL
Hgb urine dipstick: NEGATIVE
Ketones, ur: NEGATIVE mg/dL
Protein, ur: NEGATIVE mg/dL

## 2012-02-08 LAB — WET PREP, GENITAL
Clue Cells Wet Prep HPF POC: NONE SEEN
Trich, Wet Prep: NONE SEEN

## 2012-02-08 NOTE — MAU Provider Note (Signed)
Yovanna Cogan [id number] @[redacted]w[redacted]d  by LMP Chief Complaint  Patient presents with  . Fall  . Abdominal Pain     None     SUBJECTIVE  HPI: Pt presents to MAU following a fall while walking her dog today.  She fell onto her abdomen and felt a gush of fluid during the fall.  She has had damp underwear since then but has not had to wear a pad for leakage.  She is seeing a Financial risk analyst in [place] for prenatal care.  She denies vaginal bleeding, vaginal itching/burning, urinary symptoms, h/a, dizziness, n/v, or fever/chills.    Past Medical History  Diagnosis Date  . Hyperlipidemia   . Gestational diabetes     diet controlled with #3  . Preterm labor     with last 4 preg, del at term  . Urinary tract infection   . Abnormal Pap smear   . PID (pelvic inflammatory disease)   . Hyperlipemia    Past Surgical History  Procedure Date  . Appendectomy   . Induced abortion    History   Social History  . Marital Status: Married    Spouse Name: N/A    Number of Children: N/A  . Years of Education: N/A   Occupational History  . Not on file.   Social History Main Topics  . Smoking status: Never Smoker   . Smokeless tobacco: Never Used  . Alcohol Use: No  . Drug Use: No  . Sexually Active: Yes    Birth Control/ Protection: None     IUD removed 06/17/2011   Other Topics Concern  . Not on file   Social History Narrative  . No narrative on file   No current facility-administered medications on file prior to encounter.   Current Outpatient Prescriptions on File Prior to Encounter  Medication Sig Dispense Refill  . tetrahydrozoline 0.05 % ophthalmic solution 1 drop. Allergy/itching in eyes       Allergies  Allergen Reactions  . Sulfamethoxazole W-Trimethoprim Rash and Other (See Comments)    "Feels shaky"    ROS: Pertinent items in HPI  OBJECTIVE Blood pressure 120/77, pulse 82, temperature 98.5 F (36.9 C), temperature source Oral, resp. rate 16, height 5\' 4"  (1.626 m),  weight 90.538 kg (199 lb 9.6 oz), last menstrual period 12/02/2011.  GENERAL: Well-developed, well-nourished female in no acute distress.  HEENT: Normocephalic, good dentition HEART: normal rate RESP: normal effort ABDOMEN: Soft, nontender EXTREMITIES: Nontender, no edema NEURO: Alert and oriented Pelvic exam: Cervix pink, visually closed, nonfriable, without lesion, moderate amount yellow/white creamy discharge, pooling of fluid negative, vaginal walls and external genitalia normal Bimanual exam: Cervix 0/long/high, firm, posterior, neg CMT, uterus nontender, ~9 week size, adnexa without tenderness, enlargement, or mass   LAB RESULTS Ferning negative  IMAGING   ASSESSMENT Abdominal trauma in 1st trimester  PLAN Wet prep, GC/Chlamydia, Transvaginal U/S  Care assumed by Wynelle Bourgeois, CNM  LEFTWICH-KIRBY, LISA 02/08/2012 10:10 PM  [place] results:  US Ob Transvaginal  02/09/2012  *RADIOLOGY REPORT*  Clinical Data: Fall, abdominal pain.  TRANSVAGINAL OB ULTRASOUND  Technique:  Transvaginal ultrasound was performed for evaluation of the gestation as well as the maternal uterus and adnexal regions.  Comparison: 01/11/2012  Findings: Single intrauterine gestational sac with embryo.  No cardiac activity documented.  Crown-rump length of 8.5 mm equals 6 weeks 6 days.  No subchorionic hemorrhage.  Normal sonographic appearance to the right ovary.  Left ovary not visualized.  No free fluid.  IMPRESSION: Embryo  measures 8.5 mm however no cardiac activity documented. This is concerning for embryonic demise. Recommend clinical correlation and serial quantitative beta HCG or follow-up ultrasound if warranted.  Discussed via telephone with Dr. Jolayne Panther at 12:00 a.m. on 02/09/2012.  Original Report Authenticated By: Waneta Martins, M.D.   US Ob Transvaginal  01/11/2012  OBSTETRICAL ULTRASOUND: This exam was performed within a  Ultrasound Department. The OB US report was generated in the  AS system, and faxed to the ordering physician.   This report is also available in [employer] and in the [employer]. See [facility] Obstetric US report.   Discussed with patient She wishes to call her doctor in [place] tomorrow for followup I discussed the probability of miscarriage with her and possible treatments Offered f/u US in a week.  She will go back to [employer]

## 2012-02-08 NOTE — MAU Note (Signed)
Pt LMP 12/02/2011, EDC 09/07/2012, G8 P6 at 9.5wks.  Larey Seat today and landed on abd and chest.  Having lower abd pain.  Denies bleeding.

## 2012-02-09 DIAGNOSIS — O021 Missed abortion: Secondary | ICD-10-CM

## 2012-02-09 LAB — GC/CHLAMYDIA PROBE AMP, GENITAL
Chlamydia, DNA Probe: NEGATIVE
GC Probe Amp, Genital: NEGATIVE

## 2012-02-09 NOTE — Discharge Instructions (Signed)
Threatened Miscarriage  Bleeding during the first 20 weeks of pregnancy is common. This is sometimes called a threatened miscarriage. This is a pregnancy that is threatening to end before the twentieth week of pregnancy. Often this bleeding stops with bed rest or decreased activities as suggested by your caregiver and the pregnancy continues without any more problems. You may be asked to not have sexual intercourse, have orgasms or use tampons until further notice. Sometimes a threatened miscarriage can progress to a complete or incomplete miscarriage. This may or may not require further treatment. Some miscarriages occur before a woman misses a menstrual period and knows she is pregnant.  Miscarriages occur in 15 to 20% of all pregnancies and usually occur during the first 13 weeks of the pregnancy. The exact cause of a miscarriage is usually never known. A miscarriage is natures way of ending a pregnancy that is abnormal or would not make it to term. There are some things that may put you at risk to have a miscarriage, such as:   Hormone problems.   Infection of the uterus or cervix.   Chronic illness, diabetes for example, especially if it is not controlled.   Abnormal shaped uterus.   Fibroids in the uterus.   Incompetent cervix (the cervix is too weak to hold the baby).   Smoking.   Drinking too much alcohol. It's best not to drink any alcohol when you are pregnant.   Taking illegal drugs.  TREATMENT   When a miscarriage becomes complete and all products of conception (all the tissue in the uterus) have been passed, often no treatment is needed. If you think you passed tissue, save it in a container and take it to your doctor for evaluation. If the miscarriage is incomplete (parts of the fetus or placenta remain in the uterus), further treatment may be needed. The most common reason for further treatment is continued bleeding (hemorrhage) because pregnancy tissue did not pass out of the uterus. This  often occurs if a miscarriage is incomplete. Tissue left behind may also become infected. Treatment usually is dilatation and curettage (the removal of the remaining products of pregnancy. This can be done by a simple sucking procedure (suction curettage) or a simple scraping of the inside of the uterus. This may be done in the hospital or in the caregiver's office. This is only done when your caregiver knows that there is no chance for the pregnancy to proceed to term. This is determined by physical examination, negative pregnancy test, falling pregnancy hormone count and/or, an ultrasound revealing a dead fetus.  Miscarriages are often a very emotional time for prospective mothers and fathers. This is not you or your partners fault. It did not occur because of an inadequacy in you or your partner. Nearly all miscarriages occur because the pregnancy has started off wrongly. At least half of these pregnancies have a chromosomal abnormality. It is almost always not inherited. Others may have developmental problems with the fetus or placenta. This does not always show up even when the products miscarried are studied under the microscope. The miscarriage is nearly always not your fault and it is not likely that you could have prevented it from happening. If you are having emotional and grieving problems, talk to your health care provider and even seek counseling, if necessary, before getting pregnant again. You can begin trying for another pregnancy as soon as your caregiver says it is OK.  HOME CARE INSTRUCTIONS    Your caregiver may order   bed rest depending on how much bleeding and cramping you are having. You may be limited to only getting up to go to the bathroom. You may be allowed to continue light activity. You may need to make arrangements for the care of your other children and for any other responsibilities.   Keep track of the number of pads you use each day, how often you have to change pads and how  saturated (soaked) they are. Record this information.   DO NOT USE TAMPONS. Do not douche, have sexual intercourse or orgasms until approved by your caregiver.   You may receive a follow up appointment for re-evaluation of your pregnancy and a repeat blood test. Re-evaluation often occurs after 2 days and again in 4 to 6 weeks. It is very important that you follow-up in the recommended time period.   If you are Rh negative and the father is Rh positive or you do not know the fathers' blood type, you may receive a shot (Rh immune globulin) to help prevent abnormal antibodies that can develop and affect the baby in any future pregnancies.  SEEK IMMEDIATE MEDICAL CARE IF:   You have severe cramps in your stomach, back, or abdomen.   You have a sudden onset of severe pain in the lower part of your abdomen.   You develop chills.   You run an unexplained temperature of 101 F (38.3 C) or higher.   You pass large clots or tissue. Save any tissue for your caregiver to inspect.   Your bleeding increases or you become light-headed, weak, or have fainting episodes.   You have a gush of fluid from your vagina.   You pass out. This could mean you have a tubal (ectopic) pregnancy.  Document Released: 08/17/2005 Document Revised: 08/06/2011 Document Reviewed: 04/02/2008  [employer] Patient Information 2012 [employer].

## 2012-02-10 NOTE — MAU Provider Note (Signed)
Agree with above note.  Haley Mclaughlin 02/10/2012 9:59 AM

## 2012-02-29 ENCOUNTER — Ambulatory Visit: Payer: Medicaid Other | Admitting: Family Medicine

## 2012-03-02 ENCOUNTER — Ambulatory Visit (INDEPENDENT_AMBULATORY_CARE_PROVIDER_SITE_OTHER): Payer: Medicaid Other | Admitting: Family Medicine

## 2012-03-02 ENCOUNTER — Encounter: Payer: Self-pay | Admitting: Family Medicine

## 2012-03-02 VITALS — BP 121/84 | HR 88 | Ht 64.0 in | Wt 200.0 lb

## 2012-03-02 DIAGNOSIS — F410 Panic disorder [episodic paroxysmal anxiety] without agoraphobia: Secondary | ICD-10-CM

## 2012-03-02 DIAGNOSIS — E7800 Pure hypercholesterolemia, unspecified: Secondary | ICD-10-CM

## 2012-03-02 NOTE — Assessment & Plan Note (Addendum)
Worsened.  Her current symptoms of chest tightness are very likely related to this or stress general anxiety.  She has taken ssri before but does not know if helped.  She is also deconditioned and feelign the after effects of recent pregnancy.  No signs of CAD or pulmonary emboli .  Will ask her to walk for exercise and stress reduction.  If her tolerance does not increase will likely check EST and if normal start SSRI and/or counseling

## 2012-03-02 NOTE — Patient Instructions (Addendum)
See your OB for birth control - Preferred would be tubal, depo or IUD  Walk every day - start 10 then go to 20 min  If things are not better with the shortness of breath or with the stress then we will consider stress test for heart and medication  Come back in 1-2 months depending on birth control choice

## 2012-03-02 NOTE — Assessment & Plan Note (Signed)
Will restart once on reliable contaception

## 2012-03-02 NOTE — Progress Notes (Signed)
  Subjective:    Patient ID: Haley Mclaughlin, female    DOB: 1966/07/03, 46 y.o.   MRN: [id number]  HPI  Contraception - Hyperlipidemia  Had miscarriage in mid June.  Would like opinion on bc options.  She will discuss with her OB in [place].  Has a possible history of TIA in past so estrogens not indicated.  She does not desire children  Chest Back Tightness  Panic Attacks Episodes when emotionally upset of her chest becoming tight and some shortness of breath.  This does not happen with exertion but over the last few months she does not feel she can walk as well without shortness of breath.    Feels di.fferent than her previous panic attacks No chest pain with exertion No edema nor orthopnea or bleeding  PMH - recent CBC normal   Had EST about 5 years ago was normal  Review of Symptoms - see HPI   Review of Systems     Objective:   Physical Exam Heart - Regular rate and rhythm.  No murmurs, gallops or rubs.    Lungs:  Normal respiratory effort, chest expands symmetrically. Lungs are clear to auscultation, no crackles or wheezes. Extremities:  No cyanosis, edema, or deformity noted with good range of motion of all major joints.          Assessment & Plan:

## 2012-03-08 ENCOUNTER — Encounter (HOSPITAL_COMMUNITY): Payer: Self-pay | Admitting: *Deleted

## 2012-03-08 ENCOUNTER — Inpatient Hospital Stay (HOSPITAL_COMMUNITY)
Admission: AD | Admit: 2012-03-08 | Discharge: 2012-03-08 | Disposition: A | Discharge status: Home / Self Care | Payer: Medicaid Other | Source: Ambulatory Visit | Attending: Obstetrics & Gynecology | Admitting: Obstetrics & Gynecology

## 2012-03-08 DIAGNOSIS — N938 Other specified abnormal uterine and vaginal bleeding: Secondary | ICD-10-CM | POA: Insufficient documentation

## 2012-03-08 DIAGNOSIS — R109 Unspecified abdominal pain: Secondary | ICD-10-CM | POA: Insufficient documentation

## 2012-03-08 DIAGNOSIS — N760 Acute vaginitis: Secondary | ICD-10-CM

## 2012-03-08 DIAGNOSIS — A499 Bacterial infection, unspecified: Secondary | ICD-10-CM

## 2012-03-08 DIAGNOSIS — R509 Fever, unspecified: Secondary | ICD-10-CM | POA: Insufficient documentation

## 2012-03-08 DIAGNOSIS — M549 Dorsalgia, unspecified: Secondary | ICD-10-CM | POA: Insufficient documentation

## 2012-03-08 DIAGNOSIS — N949 Unspecified condition associated with female genital organs and menstrual cycle: Secondary | ICD-10-CM | POA: Insufficient documentation

## 2012-03-08 DIAGNOSIS — Z3201 Encounter for pregnancy test, result positive: Secondary | ICD-10-CM

## 2012-03-08 DIAGNOSIS — B9689 Other specified bacterial agents as the cause of diseases classified elsewhere: Secondary | ICD-10-CM | POA: Insufficient documentation

## 2012-03-08 LAB — CBC WITH DIFFERENTIAL/PLATELET
Basophils Absolute: 0.1 10*3/uL (ref 0.0–0.1)
HCT: 39.3 % (ref 36.0–46.0)
Hemoglobin: 12.7 g/dL (ref 12.0–15.0)
Lymphocytes Relative: 44 % (ref 12–46)
Monocytes Absolute: 0.4 10*3/uL (ref 0.1–1.0)
Monocytes Relative: 5 % (ref 3–12)
Neutro Abs: 3.8 10*3/uL (ref 1.7–7.7)
RDW: 12.7 % (ref 11.5–15.5)
WBC: 7.7 10*3/uL (ref 4.0–10.5)

## 2012-03-08 LAB — URINALYSIS, ROUTINE W REFLEX MICROSCOPIC
Bilirubin Urine: NEGATIVE
Ketones, ur: NEGATIVE mg/dL
Nitrite: NEGATIVE
Protein, ur: NEGATIVE mg/dL
Urobilinogen, UA: 0.2 mg/dL (ref 0.0–1.0)

## 2012-03-08 LAB — WET PREP, GENITAL: Trich, Wet Prep: NONE SEEN

## 2012-03-08 LAB — URINE MICROSCOPIC-ADD ON

## 2012-03-08 MED ORDER — METRONIDAZOLE 500 MG PO TABS: 500.0000 mg | ORAL_TABLET | Freq: Two times a day (BID) | ORAL | Status: DC

## 2012-03-08 NOTE — MAU Provider Note (Signed)
History     CSN: [id number]  Arrival date and time: 03/08/12 4098   First Provider Initiated Contact with Patient 03/08/12 2150      Chief Complaint  Patient presents with  . Fever  . Abdominal Pain  . Back Pain   HPI Haley Mclaughlin 46 y.o. Comes to MAU with fever of 102.3 at home and vaginal bleeding with odor.  Took ibuprofen at 6 pm tonight.  Thinks she has an infection as she was seen on 02-08-12 and diagnosed with a likely fetal demise ( no FHT at 8.5 mm CRL).  Did go for follow up with her OB in [place].  Had cytotec on 02-14-12.  Did not have a quant done to confirm hormone level went to zero.  Did a pregnancy test 2 weeks ago that was negative.  Did a pregnancy test this week that was positive.  Has been having intercourse without contraception or condoms.    OB History    Grav Para Term Preterm Abortions TAB SAB Ect Mult Living   9 6 6  0 2 1 1  0 0 6      Past Medical History  Diagnosis Date  . Hyperlipidemia   . Gestational diabetes     diet controlled with #3  . Preterm labor     with last 4 preg, del at term  . Urinary tract infection   . Abnormal Pap smear   . PID (pelvic inflammatory disease)   . Hyperlipemia     Past Surgical History  Procedure Date  . Appendectomy   . Induced abortion     Family History  Problem Relation Age of Onset  . Heart disease Mother   . Hypertension Mother   . Diabetes Father   . Heart disease Father   . Anesthesia problems Neg Hx     History  Substance Use Topics  . Smoking status: Never Smoker   . Smokeless tobacco: Never Used  . Alcohol Use: No    Allergies:  Allergies  Allergen Reactions  . Sulfamethoxazole W-Trimethoprim Rash and Other (See Comments)    "Feels shaky"    Prescriptions prior to admission  Medication Sig Dispense Refill  . aspirin 81 MG tablet Take 81 mg by mouth daily.      . calcium carbonate (TUMS - DOSED IN MG ELEMENTAL CALCIUM) 500 MG chewable tablet Chew 1 tablet by mouth daily. For  heartburn.        Review of Systems  Constitutional: Positive for fever.  Gastrointestinal: Negative for nausea, vomiting and abdominal pain.  Genitourinary: Negative for dysuria.       Vaginal bleeding Vaginal odor  Musculoskeletal: Positive for back pain.   Physical Exam   Blood pressure 117/63, pulse 77, temperature 98.4 F (36.9 C), temperature source Oral, resp. rate 16, height 5\' 4"  (1.626 m), weight 199 lb 6.4 oz (90.447 kg), SpO2 100.00%, unknown if currently breastfeeding.  Physical Exam  Nursing note and vitals reviewed. Constitutional: She is oriented to person, place, and time. She appears well-developed and well-nourished.  HENT:  Head: Normocephalic.  Eyes: EOM are normal.  Neck: Neck supple.  GI: Soft. There is no tenderness. There is no rebound and no guarding.  Genitourinary:       Speculum exam: Vagina - Mod amount of blood, no obvious odor Cervix - small amount of active bleeding Bimanual exam: Cervix closed Uterus very mildly tender, normal size Adnexa non tender, no masses bilaterally GC/Chlam, wet prep done Chaperone present for  exam.  Musculoskeletal: Normal range of motion.       Low midline back pain No rash or bruising noted. No CVA tenderness   Neurological: She is alert and oriented to person, place, and time.  Skin: Skin is warm and dry.  Psychiatric: She has a normal mood and affect.    MAU Course  Procedures Results for orders placed during the hospital encounter of 03/08/12 (from the past 24 hour(s))  URINALYSIS, ROUTINE W REFLEX MICROSCOPIC     Status: Abnormal   Collection Time   03/08/12  7:35 PM      Component Value Range   Color, Urine YELLOW  YELLOW   APPearance CLEAR  CLEAR   Specific Gravity, Urine >1.030 (*) 1.005 - 1.030   pH 6.0  5.0 - 8.0   Glucose, UA NEGATIVE  NEGATIVE mg/dL   Hgb urine dipstick LARGE (*) NEGATIVE   Bilirubin Urine NEGATIVE  NEGATIVE   Ketones, ur NEGATIVE  NEGATIVE mg/dL   Protein, ur NEGATIVE   NEGATIVE mg/dL   Urobilinogen, UA 0.2  0.0 - 1.0 mg/dL   Nitrite NEGATIVE  NEGATIVE   Leukocytes, UA SMALL (*) NEGATIVE  URINE MICROSCOPIC-ADD ON     Status: Abnormal   Collection Time   03/08/12  7:35 PM      Component Value Range   Squamous Epithelial / LPF MANY (*) RARE   WBC, UA 7-10  <3 WBC/hpf   RBC / HPF 3-6  <3 RBC/hpf   Bacteria, UA RARE  RARE   Urine-Other MUCOUS PRESENT    POCT PREGNANCY, URINE     Status: Abnormal   Collection Time   03/08/12  7:42 PM      Component Value Range   Preg Test, Ur POSITIVE (*) NEGATIVE  HCG, QUANTITATIVE, PREGNANCY     Status: Abnormal   Collection Time   03/08/12  8:15 PM      Component Value Range   hCG, Beta Chain, Quant, S 35 (*) <5 mIU/mL  CBC WITH DIFFERENTIAL     Status: Normal   Collection Time   03/08/12  8:18 PM      Component Value Range   WBC 7.7  4.0 - 10.5 K/uL   RBC 4.35  3.87 - 5.11 MIL/uL   Hemoglobin 12.7  12.0 - 15.0 g/dL   HCT 16.1  09.6 - 04.5 %   MCV 90.3  78.0 - 100.0 fL   MCH 29.2  26.0 - 34.0 pg   MCHC 32.3  30.0 - 36.0 g/dL   RDW 40.9  81.1 - 91.4 %   Platelets 241  150 - 400 K/uL   Neutrophils Relative 49  43 - 77 %   Neutro Abs 3.8  1.7 - 7.7 K/uL   Lymphocytes Relative 44  12 - 46 %   Lymphs Abs 3.4  0.7 - 4.0 K/uL   Monocytes Relative 5  3 - 12 %   Monocytes Absolute 0.4  0.1 - 1.0 K/uL   Eosinophils Relative 2  0 - 5 %   Eosinophils Absolute 0.2  0.0 - 0.7 K/uL   Basophils Relative 1  0 - 1 %   Basophils Absolute 0.1  0.0 - 0.1 K/uL  WET PREP, GENITAL     Status: Abnormal   Collection Time   03/08/12  9:56 PM      Component Value Range   Yeast Wet Prep HPF POC NONE SEEN  NONE SEEN   Trich, Wet Prep NONE SEEN  NONE SEEN   Clue Cells Wet Prep HPF POC MODERATE (*) NONE SEEN   WBC, Wet Prep HPF POC FEW (*) NONE SEEN    MDM Urine culture pending 2235  Consult with Dr. Jolayne Panther - reviewed plan of care.  Assessment and Plan  Positive pregnancy test Fever - currently normal Hx of miscarriage in June  2013 Bacterial vaginosis  Plan Repeat quant in 48 hours to determine if this hormone level is decreasing from miscarriage in June or is a new pregnancy. Take Tylenol 325 mg 2 tablets by mouth every 4 hours if needed for pain. Drink at least 8 8-oz glasses of water every day. Rx Flagyl 500 mg PO bid x 7 days (#14) no refills  Dyan Labarbera 03/08/2012, 10:21 PM

## 2012-03-08 NOTE — MAU Note (Signed)
Patient is in with c/o lower back pain, body ache and fever. She states that she took 1 tablet of advil at 1800pm. Patient reports that her OB is in [place] and did not object her trying to concieve right after her June miscarriage. She states that she placed the cytotec on June 16th and had her f/u the next week. No f/u bhcg was done per patient. She states that she stopped and started having intercourse June 24th. She also states that she started spotting while in room here. No blood observed. She is now afebrile.

## 2012-03-08 NOTE — MAU Note (Signed)
Pt miscarried 6/16, had cytotec.  Today fever 102.3, feeling achy, abd and back pain and vaginal odor.  Denies bleeding or discharge.

## 2012-03-10 LAB — URINE CULTURE: Special Requests: NORMAL

## 2012-03-10 NOTE — MAU Provider Note (Signed)
Agree with above note.  Haley Mclaughlin 03/10/2012 9:30 AM

## 2012-03-11 ENCOUNTER — Encounter (HOSPITAL_COMMUNITY): Payer: Self-pay

## 2012-03-11 ENCOUNTER — Inpatient Hospital Stay (HOSPITAL_COMMUNITY)
Admission: AD | Admit: 2012-03-11 | Discharge: 2012-03-11 | Disposition: A | Discharge status: Home / Self Care | Payer: Medicaid Other | Source: Ambulatory Visit | Attending: Obstetrics & Gynecology | Admitting: Obstetrics & Gynecology

## 2012-03-11 DIAGNOSIS — O021 Missed abortion: Secondary | ICD-10-CM | POA: Insufficient documentation

## 2012-03-11 DIAGNOSIS — Z09 Encounter for follow-up examination after completed treatment for conditions other than malignant neoplasm: Secondary | ICD-10-CM

## 2012-03-11 LAB — HCG, QUANTITATIVE, PREGNANCY: hCG, Beta Chain, Quant, S: 22 m[IU]/mL — ABNORMAL HIGH (ref <5)

## 2012-03-11 NOTE — MAU Provider Note (Signed)
Medical Screening exam and patient care preformed by advanced practice provider.  Agree with the above management.  

## 2012-03-11 NOTE — MAU Provider Note (Signed)
  History     CSN: [id number]  Arrival date and time: 03/11/12 1109   None     Chief Complaint  Patient presents with  . Follow-up bhcg    HPI Haley Mclaughlin is 46 y.o. [id number] [redacted]w[redacted]d weeks presents for follow up BHCG.  Hx of fetal demise 6/10 with follow up with her MD in [place].  DId not follow BHCG done to <5.  Had negative UPT 2 weeks ago and then a + one this week.  At Visit here on 7/9 UPT was + and BHCG 35.  She was dx and given medication for Bacterial Vaginosis.  She denies pain and bleeding today.   Past Medical History  Diagnosis Date  . Hyperlipidemia   . Gestational diabetes     diet controlled with #3  . Preterm labor     with last 4 preg, del at term  . Urinary tract infection   . Abnormal Pap smear   . PID (pelvic inflammatory disease)   . Hyperlipemia     Past Surgical History  Procedure Date  . Appendectomy   . Induced abortion     Family History  Problem Relation Age of Onset  . Heart disease Mother   . Hypertension Mother   . Diabetes Father   . Heart disease Father   . Anesthesia problems Neg Hx     History  Substance Use Topics  . Smoking status: Never Smoker   . Smokeless tobacco: Never Used  . Alcohol Use: No    Allergies:  Allergies  Allergen Reactions  . Sulfamethoxazole W-Trimethoprim Rash and Other (See Comments)    "Feels shaky"    Prescriptions prior to admission  Medication Sig Dispense Refill  . calcium carbonate (TUMS - DOSED IN MG ELEMENTAL CALCIUM) 500 MG chewable tablet Chew 1 tablet by mouth daily. For heartburn.      . metroNIDAZOLE (FLAGYL) 500 MG tablet Take 1 tablet (500 mg total) by mouth 2 (two) times daily. No alcohol while taking this medication  14 tablet  0    Review of Systems  Constitutional: Negative.   Gastrointestinal: Negative.   Genitourinary: Negative.    Physical Exam   unknown if currently breastfeeding.  Physical Exam  Constitutional: She is oriented to person, place, and time. She  appears well-developed and well-nourished.  HENT:  Head: Normocephalic.  Cardiovascular: Normal rate.   Respiratory: Effort normal.  Genitourinary:       Not indicated  Neurological: She is alert and oriented to person, place, and time.  Skin: Skin is warm and dry.  Psychiatric: She has a normal mood and affect.   Results for orders placed during the hospital encounter of 03/11/12 (from the past 24 hour(s))  HCG, QUANTITATIVE, PREGNANCY     Status: Abnormal   Collection Time   03/11/12 11:25 AM      Component Value Range   hCG, Beta Chain, Quant, S 22 (*) <5 mIU/mL    MAU Course  Procedures  MDM   Assessment and Plan  A:  Hx of fetal demise 6/10 with falling BHCGs  P: F/U appt requested [facility] for repeat BHCG in 1 week.  [name],EVE M 03/11/2012, 11:28 AM

## 2012-03-11 NOTE — MAU Note (Signed)
Pt denies pain or bleeding. Here for f/u bhcg only.

## 2012-03-16 ENCOUNTER — Encounter: Payer: Self-pay | Admitting: Family Medicine

## 2012-03-16 ENCOUNTER — Other Ambulatory Visit: Payer: Self-pay

## 2012-03-16 ENCOUNTER — Ambulatory Visit (HOSPITAL_COMMUNITY)
Admission: RE | Admit: 2012-03-16 | Discharge: 2012-03-16 | Disposition: A | Discharge status: Home / Self Care | Payer: Medicaid Other | Source: Ambulatory Visit | Attending: Emergency Medicine | Admitting: Emergency Medicine

## 2012-03-16 ENCOUNTER — Ambulatory Visit (INDEPENDENT_AMBULATORY_CARE_PROVIDER_SITE_OTHER): Payer: Medicaid Other | Admitting: Family Medicine

## 2012-03-16 VITALS — BP 130/81 | HR 82 | Temp 98.4°F | Ht 64.0 in | Wt 199.8 lb

## 2012-03-16 DIAGNOSIS — R0609 Other forms of dyspnea: Secondary | ICD-10-CM | POA: Insufficient documentation

## 2012-03-16 DIAGNOSIS — R0600 Dyspnea, unspecified: Secondary | ICD-10-CM | POA: Insufficient documentation

## 2012-03-16 DIAGNOSIS — R0989 Other specified symptoms and signs involving the circulatory and respiratory systems: Secondary | ICD-10-CM

## 2012-03-16 NOTE — Patient Instructions (Addendum)
I will call you if your tests are not good.  Otherwise I will send you a letter.  If you do not hear from me with in 2 weeks please call our office.     Will contact you about a stress test  If severe chest pain or shortness of breath call us or go to ER
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
## 2012-03-17 LAB — COMPREHENSIVE METABOLIC PANEL
ALT: 36 U/L — ABNORMAL HIGH (ref 0–35)
AST: 30 U/L (ref 0–37)
Alkaline Phosphatase: 37 U/L — ABNORMAL LOW (ref 39–117)
CO2: 27 mEq/L (ref 19–32)
Sodium: 138 mEq/L (ref 135–145)
Total Bilirubin: 0.5 mg/dL (ref 0.3–1.2)
Total Protein: 6.9 g/dL (ref 6.0–8.3)

## 2012-03-17 LAB — TSH: TSH: 1.895 u[IU]/mL (ref 0.350–4.500)

## 2012-03-17 NOTE — Assessment & Plan Note (Signed)
Seems most consistent with Panic Attacks/Anxiety and deconditiioning  She has a strong fhx of CAD and is concerned about this.   Recent labs (cbc,cmet,tsh) normal.  Will refer for EST and if normal hopefully get on exercise plan

## 2012-03-23 NOTE — Addendum Note (Signed)
Addended by: Pearlean Brownie L on: 03/23/2012 09:38 AM   Modules accepted: Orders

## 2012-05-09 ENCOUNTER — Other Ambulatory Visit: Payer: Self-pay | Admitting: Family Medicine

## 2012-05-09 DIAGNOSIS — Z1231 Encounter for screening mammogram for malignant neoplasm of breast: Secondary | ICD-10-CM

## 2012-05-22 ENCOUNTER — Inpatient Hospital Stay (HOSPITAL_COMMUNITY)
Admission: AD | Admit: 2012-05-22 | Discharge: 2012-05-22 | Disposition: A | Discharge status: Home / Self Care | Payer: Medicaid Other | Source: Ambulatory Visit | Attending: Obstetrics and Gynecology | Admitting: Obstetrics and Gynecology

## 2012-05-22 ENCOUNTER — Encounter (HOSPITAL_COMMUNITY): Payer: Self-pay | Admitting: *Deleted

## 2012-05-22 ENCOUNTER — Inpatient Hospital Stay (HOSPITAL_COMMUNITY): Payer: Medicaid Other

## 2012-05-22 DIAGNOSIS — N949 Unspecified condition associated with female genital organs and menstrual cycle: Secondary | ICD-10-CM | POA: Insufficient documentation

## 2012-05-22 DIAGNOSIS — N940 Mittelschmerz: Secondary | ICD-10-CM | POA: Insufficient documentation

## 2012-05-22 DIAGNOSIS — R109 Unspecified abdominal pain: Secondary | ICD-10-CM | POA: Insufficient documentation

## 2012-05-22 DIAGNOSIS — B9689 Other specified bacterial agents as the cause of diseases classified elsewhere: Secondary | ICD-10-CM | POA: Insufficient documentation

## 2012-05-22 DIAGNOSIS — N760 Acute vaginitis: Secondary | ICD-10-CM | POA: Insufficient documentation

## 2012-05-22 DIAGNOSIS — A499 Bacterial infection, unspecified: Secondary | ICD-10-CM | POA: Insufficient documentation

## 2012-05-22 LAB — URINALYSIS, ROUTINE W REFLEX MICROSCOPIC
Bilirubin Urine: NEGATIVE
Glucose, UA: NEGATIVE mg/dL
Ketones, ur: NEGATIVE mg/dL
Specific Gravity, Urine: 1.015 (ref 1.005–1.030)
pH: 6 (ref 5.0–8.0)

## 2012-05-22 LAB — WET PREP, GENITAL

## 2012-05-22 LAB — URINE MICROSCOPIC-ADD ON

## 2012-05-22 MED ORDER — METRONIDAZOLE 500 MG PO TABS: 500.0000 mg | ORAL_TABLET | Freq: Two times a day (BID) | ORAL | Status: DC

## 2012-05-22 NOTE — MAU Note (Addendum)
Pt reports having [employer]Q  Pain that started yesterday. Reports having nausea and some yellow mucsy discharge as well. Pt had miscarriage in June 2013.

## 2012-05-22 NOTE — MAU Provider Note (Signed)
History     CSN: [id number]  Arrival date and time: 05/22/12 1357   First Provider Initiated Contact with Patient 05/22/12 1443      Chief Complaint  Patient presents with  . Abdominal Pain   HPI 46 y.o. [id number] with left pelvic pain x 2 days. White vaginal discharge yesterday, spotting today. Patient's last menstrual period was 04/21/2012.   Past Medical History  Diagnosis Date  . Hyperlipidemia   . Gestational diabetes     diet controlled with #3  . Preterm labor     with last 4 preg, del at term  . Urinary tract infection   . Abnormal Pap smear   . PID (pelvic inflammatory disease)   . Hyperlipemia     Past Surgical History  Procedure Date  . Appendectomy   . Induced abortion     Family History  Problem Relation Age of Onset  . Heart disease Mother   . Hypertension Mother   . Diabetes Father   . Heart disease Father   . Anesthesia problems Neg Hx     History  Substance Use Topics  . Smoking status: Never Smoker   . Smokeless tobacco: Never Used  . Alcohol Use: No    Allergies:  Allergies  Allergen Reactions  . Sulfamethoxazole W-Trimethoprim Rash and Other (See Comments)    "Feels shaky"    No prescriptions prior to admission    Review of Systems  Constitutional: Negative.   Respiratory: Negative.   Cardiovascular: Negative.   Gastrointestinal: Positive for abdominal pain. Negative for nausea, vomiting, diarrhea and constipation.  Genitourinary: Negative for dysuria, urgency, frequency, hematuria and flank pain.       Positive for vaginal bleeding and discharge   Musculoskeletal: Negative.   Neurological: Negative.   Psychiatric/Behavioral: Negative.    Physical Exam   Blood pressure 111/79, pulse 75, temperature 98.3 F (36.8 C), temperature source Oral, resp. rate 18, height 5\' 4"  (1.626 m), weight 198 lb (89.812 kg), last menstrual period 04/21/2012, unknown if currently breastfeeding.  Physical Exam  Nursing note and vitals  reviewed. Constitutional: She is oriented to person, place, and time. She appears well-developed and well-nourished. No distress.  HENT:  Head: Normocephalic and atraumatic.  Cardiovascular: Normal rate and normal heart sounds.   Respiratory: Effort normal. No respiratory distress.  GI: Soft. She exhibits no distension and no mass. There is no tenderness. There is no rebound and no guarding.  Genitourinary: There is no rash or lesion on the right labia. There is no rash or lesion on the left labia. Uterus is not deviated, not enlarged, not fixed and not tender. Cervix exhibits no motion tenderness, no discharge and no friability. Right adnexum displays no mass, no tenderness and no fullness. Left adnexum displays tenderness. Left adnexum displays no mass and no fullness. There is bleeding (small) around the vagina. No erythema or tenderness around the vagina. No vaginal discharge found.  Neurological: She is alert and oriented to person, place, and time.  Skin: Skin is warm and dry.  Psychiatric: She has a normal mood and affect.    MAU Course  Procedures Results for orders placed during the hospital encounter of 05/22/12 (from the past 24 hour(s))  URINALYSIS, ROUTINE W REFLEX MICROSCOPIC     Status: Abnormal   Collection Time   05/22/12  2:10 PM      Component Value Range   Color, Urine YELLOW  YELLOW   APPearance CLEAR  CLEAR   Specific Gravity, Urine 1.015  1.005 - 1.030   pH 6.0  5.0 - 8.0   Glucose, UA NEGATIVE  NEGATIVE mg/dL   Hgb urine dipstick LARGE (*) NEGATIVE   Bilirubin Urine NEGATIVE  NEGATIVE   Ketones, ur NEGATIVE  NEGATIVE mg/dL   Protein, ur NEGATIVE  NEGATIVE mg/dL   Urobilinogen, UA 0.2  0.0 - 1.0 mg/dL   Nitrite NEGATIVE  NEGATIVE   Leukocytes, UA NEGATIVE  NEGATIVE  URINE MICROSCOPIC-ADD ON     Status: Abnormal   Collection Time   05/22/12  2:10 PM      Component Value Range   Squamous Epithelial / LPF FEW (*) RARE   WBC, UA 0-2  <3 WBC/hpf   RBC / HPF 3-6   <3 RBC/hpf   Bacteria, UA FEW (*) RARE  POCT PREGNANCY, URINE     Status: Normal   Collection Time   05/22/12  2:33 PM      Component Value Range   Preg Test, Ur NEGATIVE  NEGATIVE  WET PREP, GENITAL     Status: Abnormal   Collection Time   05/22/12  2:44 PM      Component Value Range   Yeast Wet Prep HPF POC NONE SEEN  NONE SEEN   Trich, Wet Prep NONE SEEN  NONE SEEN   Clue Cells Wet Prep HPF POC FEW (*) NONE SEEN   WBC, Wet Prep HPF POC FEW (*) NONE SEEN   US Transvaginal Non-ob  05/22/2012  *RADIOLOGY REPORT*  Clinical Data: Left adnexal tenderness and nausea.  TRANSABDOMINAL AND TRANSVAGINAL ULTRASOUND OF PELVIS Technique:  Both transabdominal and transvaginal ultrasound examinations of the pelvis were performed. Transabdominal technique was performed for global imaging of the pelvis including uterus, ovaries, adnexal regions, and pelvic cul-de-sac.  It was necessary to proceed with endovaginal exam following the transabdominal exam to visualize the left ovary and left adnexa.  Comparison:  02/08/2012  Findings:  Uterus: Measures 11.3 x 5.7 x 7.4 cm.  Myometrium unremarkable.  Endometrium: Measures 5 mm in thickness and appears normal.  Right ovary:  Measures 3.2 x 1.9 x 2.8 cm and appears normal.  Left ovary: Measures 4.4 x 2.4 x 2.4 cm and contains a peripheral 2.3 x 1 with 7 x 2.2 cm cyst or dominant follicle.  Power Doppler interrogation of the left ovary demonstrates internal flow signal although detailed wave form analysis was not requested or performed.  Other findings: No free fluid  IMPRESSION:  1.  Small cyst or dominant follicle along the margin of the left ovary.  Otherwise normal exam.   Original Report Authenticated By: Dellia Cloud, M.D.    US Pelvis Complete  05/22/2012  *RADIOLOGY REPORT*  Clinical Data: Left adnexal tenderness and nausea.  TRANSABDOMINAL AND TRANSVAGINAL ULTRASOUND OF PELVIS Technique:  Both transabdominal and transvaginal ultrasound examinations of  the pelvis were performed. Transabdominal technique was performed for global imaging of the pelvis including uterus, ovaries, adnexal regions, and pelvic cul-de-sac.  It was necessary to proceed with endovaginal exam following the transabdominal exam to visualize the left ovary and left adnexa.  Comparison:  02/08/2012  Findings:  Uterus: Measures 11.3 x 5.7 x 7.4 cm.  Myometrium unremarkable.  Endometrium: Measures 5 mm in thickness and appears normal.  Right ovary:  Measures 3.2 x 1.9 x 2.8 cm and appears normal.  Left ovary: Measures 4.4 x 2.4 x 2.4 cm and contains a peripheral 2.3 x 1 with 7 x 2.2 cm cyst or dominant follicle.  Power Doppler interrogation of  the left ovary demonstrates internal flow signal although detailed wave form analysis was not requested or performed.  Other findings: No free fluid  IMPRESSION:  1.  Small cyst or dominant follicle along the margin of the left ovary.  Otherwise normal exam.   Original Report Authenticated By: Dellia Cloud, M.D.    Assessment and Plan   1. [place]merz   2. BV    Medication List     As of 05/22/2012  6:46 PM    START taking these medications         metroNIDAZOLE 500 MG tablet   Commonly known as: FLAGYL   Take 1 tablet (500 mg total) by mouth 2 (two) times daily.      CONTINUE taking these medications         aspirin 81 MG tablet      ibuprofen 600 MG tablet   Commonly known as: ADVIL,MOTRIN          Where to get your medications    These are the prescriptions that you need to pick up. We sent them to a specific pharmacy, so you will need to go there to get them.   [facility] [address] [place], [place] - [address] [address]    [address] [place] [address]    Phone: [phone]        metroNIDAZOLE 500 MG tablet             Bobbijo Holst 05/22/2012, 6:45 PM

## 2012-05-23 ENCOUNTER — Ambulatory Visit (HOSPITAL_COMMUNITY): Payer: Medicaid Other | Attending: Family Medicine

## 2012-05-23 LAB — GC/CHLAMYDIA PROBE AMP, GENITAL
Chlamydia, DNA Probe: NEGATIVE
GC Probe Amp, Genital: NEGATIVE

## 2012-05-31 NOTE — MAU Provider Note (Signed)
Attestation of Attending Supervision of Advanced Practitioner: Evaluation and management procedures were performed by the PA/NP/CNM/OB Fellow under my supervision/collaboration. Chart reviewed and agree with management and plan.  Trevyn Lumpkin V 05/31/2012 3:37 PM     

## 2012-06-01 ENCOUNTER — Encounter: Payer: Self-pay | Admitting: Family Medicine

## 2012-06-01 ENCOUNTER — Ambulatory Visit (INDEPENDENT_AMBULATORY_CARE_PROVIDER_SITE_OTHER): Payer: Medicaid Other | Admitting: Family Medicine

## 2012-06-01 VITALS — BP 117/80 | HR 73 | Temp 98.2°F | Ht 64.0 in | Wt 201.0 lb

## 2012-06-01 DIAGNOSIS — R0609 Other forms of dyspnea: Secondary | ICD-10-CM

## 2012-06-01 DIAGNOSIS — R0600 Dyspnea, unspecified: Secondary | ICD-10-CM

## 2012-06-01 DIAGNOSIS — R0989 Other specified symptoms and signs involving the circulatory and respiratory systems: Secondary | ICD-10-CM

## 2012-06-01 DIAGNOSIS — E7800 Pure hypercholesterolemia, unspecified: Secondary | ICD-10-CM

## 2012-06-01 DIAGNOSIS — R109 Unspecified abdominal pain: Secondary | ICD-10-CM

## 2012-06-01 MED ORDER — HYOSCYAMINE SULFATE 0.125 MG PO TABS: 0.1250 mg | ORAL_TABLET | ORAL | Status: DC | PRN

## 2012-06-01 NOTE — Patient Instructions (Addendum)
Aim to Walk for exercise every day for 30 minutes at 630 PM on the walking track      To start walk about 5-10 minutes then rest the repeat slowly increase until you are walking 30 minute straight then slowly increase speed  Try the Levsion for severe pain.  If you have bleeding or fever or nausea or vomiting or really severe pain that is not better with ibuprofen and the levsin then call us or go back to [facility]  We will check your cholesterol in 6 months - work on diet and exercise.

## 2012-06-01 NOTE — Assessment & Plan Note (Signed)
Consistent with poor conditioning no evidece of coronary artery disease with normal stress test or of pulmonary condition.  Recommend regular gradual exercise.  She agrees

## 2012-06-01 NOTE — Assessment & Plan Note (Signed)
Not on a statin.  Still childbearing age and is only using condoms for contraception.  She would perfer to work on exercise and diet and recheck LDL in 6 months

## 2012-06-01 NOTE — Progress Notes (Signed)
  Subjective:    Patient ID: Haley Mclaughlin, female    DOB: 1966-07-21, 46 y.o.   MRN: [id number]  HPI  Dyspnea Still feels tired and out of breath with exercise.  Had normal EST last month that showed low endurance.  Is not exercising regularly.   No syncope or edema or severe chest pain with exercise or wheezing or sputum production or weight loss  Abdomen pain LLQ for the last week.  Is intermittent sharp and severe.   Ibuprofen helps a little.   No nausea or vomiting or diarrhea or constipation or bleeding (gi or urine) [facility] with normal ua and US showed very small cyst.  Since she has had her menstrual period but no change in pain  Review of Symptoms - see HPI  PMH - Smoking status noted.     Review of Systems     Objective:   Physical Exam  alert no acute distress Abdomen: soft and with mild LLQ tenderness  without masses, organomegaly or hernias noted.  No guarding or rebound No CVAT Heart - Regular rate and rhythm.  No murmurs, gallops or rubs.    Lungs:  Normal respiratory effort, chest expands symmetrically. Lungs are clear to auscultation, no crackles or wheezes. Marland Kitchenexrem        Assessment & Plan:

## 2012-06-01 NOTE — Assessment & Plan Note (Signed)
No evident cause.  No red flags for infection or obstruction.  Perhaps an IBS variant.  Will give trial of levsin

## 2012-06-15 ENCOUNTER — Ambulatory Visit (HOSPITAL_COMMUNITY)
Admission: RE | Admit: 2012-06-15 | Discharge: 2012-06-15 | Disposition: A | Discharge status: Home / Self Care | Payer: Medicaid Other | Source: Ambulatory Visit | Attending: Family Medicine | Admitting: Family Medicine

## 2012-06-15 DIAGNOSIS — Z1231 Encounter for screening mammogram for malignant neoplasm of breast: Secondary | ICD-10-CM

## 2012-11-02 ENCOUNTER — Ambulatory Visit
Admission: RE | Admit: 2012-11-02 | Discharge: 2012-11-02 | Disposition: A | Discharge status: Home / Self Care | Payer: Medicaid Other | Source: Ambulatory Visit | Attending: Family Medicine | Admitting: Family Medicine

## 2012-11-02 ENCOUNTER — Encounter: Payer: Self-pay | Admitting: Family Medicine

## 2012-11-02 ENCOUNTER — Ambulatory Visit (INDEPENDENT_AMBULATORY_CARE_PROVIDER_SITE_OTHER): Payer: Medicaid Other | Admitting: Family Medicine

## 2012-11-02 VITALS — BP 120/70 | HR 99 | Temp 98.8°F | Ht 64.0 in | Wt 212.0 lb

## 2012-11-02 DIAGNOSIS — M545 Low back pain, unspecified: Secondary | ICD-10-CM

## 2012-11-02 DIAGNOSIS — M549 Dorsalgia, unspecified: Secondary | ICD-10-CM | POA: Insufficient documentation

## 2012-11-02 NOTE — Patient Instructions (Signed)
Your lumbar pain may be a strain or pull. Could have some early arthritis changes. Get your xray done.  You can keep using advil or aleve or tylenol.  Try heating pads, stretching.  Sometimes physical therapy or weight loss can help.   Back Pain, Adult Low back pain is very common. About 1 in 5 people have back pain.The cause of low back pain is rarely dangerous. The pain often gets better over time.About half of people with a sudden onset of back pain feel better in just 2 weeks. About 8 in 10 people feel better by 6 weeks.  CAUSES Some common causes of back pain include:  Strain of the muscles or ligaments supporting the spine.  Wear and tear (degeneration) of the spinal discs.  Arthritis.  Direct injury to the back. DIAGNOSIS Most of the time, the direct cause of low back pain is not known.However, back pain can be treated effectively even when the exact cause of the pain is unknown.Answering your caregiver's questions about your overall health and symptoms is one of the most accurate ways to make sure the cause of your pain is not dangerous. If your caregiver needs more information, he or she may order lab work or imaging tests (X-rays or MRIs).However, even if imaging tests show changes in your back, this usually does not require surgery. HOME CARE INSTRUCTIONS For many people, back pain returns.Since low back pain is rarely dangerous, it is often a condition that people can learn to [facility] their own.   Remain active. It is stressful on the back to sit or stand in one place. Do not sit, drive, or stand in one place for more than 30 minutes at a time. Take short walks on level surfaces as soon as pain allows.Try to increase the length of time you walk each day.  Do not stay in bed.Resting more than 1 or 2 days can delay your recovery.  Do not avoid exercise or work.Your body is made to move.It is not dangerous to be active, even though your back may hurt.Your back will  likely heal faster if you return to being active before your pain is gone.  Pay attention to your body when you bend and lift. Many people have less discomfortwhen lifting if they bend their knees, keep the load close to their bodies,and avoid twisting. Often, the most comfortable positions are those that put less stress on your recovering back.  Find a comfortable position to sleep. Use a firm mattress and lie on your side with your knees slightly bent. If you lie on your back, put a pillow under your knees.  Only take over-the-counter or prescription medicines as directed by your caregiver. Over-the-counter medicines to reduce pain and inflammation are often the most helpful.Your caregiver may prescribe muscle relaxant drugs.These medicines help dull your pain so you can more quickly return to your normal activities and healthy exercise.  Put ice on the injured area.  Put ice in a plastic bag.  Place a towel between your skin and the bag.  Leave the ice on for 15 to 20 minutes, 3 to 4 times a day for the first 2 to 3 days. After that, ice and heat may be alternated to reduce pain and spasms.  Ask your caregiver about trying back exercises and gentle massage. This may be of some benefit.  Avoid feeling anxious or stressed.Stress increases muscle tension and can worsen back pain.It is important to recognize when you are anxious or stressed and  learn ways to manage it.Exercise is a great option. SEEK MEDICAL CARE IF:  You have pain that is not relieved with rest or medicine.  You have pain that does not improve in 1 week.  You have new symptoms.  You are generally not feeling well. SEEK IMMEDIATE MEDICAL CARE IF:   You have pain that radiates from your back into your legs.  You develop new bowel or bladder control problems.  You have unusual weakness or numbness in your arms or legs.  You develop nausea or vomiting.  You develop abdominal pain.  You feel  faint. Document Released: 08/17/2005 Document Revised: 02/16/2012 Document Reviewed: 01/05/2011 [facility] Patient Information 2013 [place], [place].

## 2012-11-02 NOTE — Assessment & Plan Note (Signed)
Exacerbated x 2 months, worsening despite use of OTC analgesics. 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 Since there is some bony tenderness and worsening pain, will check plain film to rule out pathologic or compression fracture, though she is low risk for this. Recommended she continue OTC analgesic, heat, avoid bedrest. May benefit from PT if xray is negative. Recommend weight loss also. F/u with PCP.

## 2012-11-02 NOTE — Progress Notes (Signed)
  Subjective:    Patient ID: Haley Mclaughlin, female    DOB: 1965-10-24, 47 y.o.   MRN: [id number]  Back Pain    1. Low back pain. Started few years ago, but worsening in past 2 weeks in the central lumbar area where her spine curves. It is worsened with extension of spine and standing. She has trouble even standing to wash dishes currently. There is tenderness over the bony area. No significant leg pain or weakness, fever, chills, trauma, injury, dysuria, rash.  She recently had a miscarriage and states her period started today. No history of back surgery. States she attended a health fair once and her bone density was "low".  Review of Systems  Musculoskeletal: Positive for back pain.   See HPI otherwise negative.  reports that she has never smoked. She has never used smokeless tobacco.     Objective:   Physical Exam  Vitals reviewed. Constitutional: She is oriented to person, place, and time. She appears well-developed and well-nourished. No distress.  HENT:  Head: Normocephalic and atraumatic.  Pulmonary/Chest: Effort normal.  Musculoskeletal:  Significant lumbar lordosis noted. Normal flexion and lateral flexion of spine. Mild pain with lumbar extension. TTP in spinous processess of L 2-4 area. No paraspinal musculature tenderness. Negative SLT B. Negative FABER B. Sensation intact LEs, 5/5 hip flexor strength.    Neurological: She is alert and oriented to person, place, and time.  Skin: She is not diaphoretic.  Psychiatric: She has a normal mood and affect.          Assessment & Plan:

## 2012-11-10 ENCOUNTER — Telehealth: Payer: Self-pay | Admitting: *Deleted

## 2012-11-10 NOTE — Telephone Encounter (Signed)
Related message pt voiced understanding. Haley Mclaughlin, Haley Mclaughlin   

## 2012-11-10 NOTE — Telephone Encounter (Signed)
Message copied by Tanna Savoy on Thu Nov 10, 2012  5:02 PM ------      Message from: Durwin Reges      Created: Wed Nov 09, 2012  2:32 PM      Regarding: xray results       Please inform patient her xray showed very very mild arthritis, nothing worrisome on her back. F/u with PCP as needed. ------

## 2013-01-09 ENCOUNTER — Emergency Department: Payer: Self-pay | Admitting: Emergency Medicine

## 2013-01-12 ENCOUNTER — Emergency Department: Payer: Self-pay | Admitting: Emergency Medicine

## 2013-01-16 ENCOUNTER — Telehealth: Payer: Self-pay | Admitting: Family Medicine

## 2013-01-16 NOTE — Telephone Encounter (Signed)
Spoke with pt.  She tells me that they "did an untrasound on me and it was normal" She also states that the redness is getting bigger and she was running a fever yesterday.  No fever today.  Pt agreeable to appt in the AM.  Advised if pain gets worse, fever returns or any SOB to go directly to the ED.  Pt agreeable. Fleeger, Maryjo Rochester

## 2013-01-16 NOTE — Telephone Encounter (Signed)
Patient is calling because she was treated at [facility] for a cellulitis.  She was told that if she was not getting any better to call [facility].  She has another area showing up and she has been taking an antibiotic since Thursday.

## 2013-01-17 ENCOUNTER — Encounter: Payer: Self-pay | Admitting: Family Medicine

## 2013-01-17 ENCOUNTER — Ambulatory Visit (INDEPENDENT_AMBULATORY_CARE_PROVIDER_SITE_OTHER): Payer: Medicaid Other | Admitting: Family Medicine

## 2013-01-17 VITALS — BP 125/85 | HR 97 | Temp 98.6°F | Ht 64.0 in | Wt 208.0 lb

## 2013-01-17 DIAGNOSIS — M79606 Pain in leg, unspecified: Secondary | ICD-10-CM | POA: Insufficient documentation

## 2013-01-17 DIAGNOSIS — M79609 Pain in unspecified limb: Secondary | ICD-10-CM

## 2013-01-17 DIAGNOSIS — M79604 Pain in right leg: Secondary | ICD-10-CM

## 2013-01-17 DIAGNOSIS — M7989 Other specified soft tissue disorders: Secondary | ICD-10-CM | POA: Insufficient documentation

## 2013-01-17 NOTE — Patient Instructions (Signed)
Thank you for coming Haley Mclaughlin, I am sorry about your leg pain and swelling,I am also concern you might have some blood clot,eventhough you mentioned you had U/S of your leg at [facility] called and they are not going to release your report till you sign a release of information form. If test is negative,then I will suggest you complete your antibiotic for now and return in 1 wk for reassessment. While awaiting your U/S report I will suggest we do the following; 1. Get a d-dimer which can also tell us to some extent if you have blood clot. 2. Complete A/B dose. 3. Warm compression on leg. 4. Ibuprofen prn pain. 5. Return sooner if pain worsens.

## 2013-01-17 NOTE — Assessment & Plan Note (Signed)
Patient already on Antibiotic,advised to complete Clindamycin course.     Warm compression also recommended on right LL.     D-Dimer ordered to assess for DVT.    I called [facility],they will not release U/S for DVT,release of information form filled,I aill await report,if not available will order U/S.

## 2013-01-17 NOTE — Progress Notes (Signed)
Subjective:     Patient ID: Haley Mclaughlin, female   DOB: March 30, 1966, 47 y.o.   MRN: [id number]  HPI Leg pain and swelling: Right foot pain with swelling and redness for 1 wk,started with a small knot which gradually got bigger and painful.Pain worsens with walking,denies trauma or injury,no insect bite.She went to the ER 2 times last week,she was started on Clindamycin 300 mg TID since last Thursday.Denies any fever now,but had low grade fever few days ago,she used Motrin for pain constantly.Pain is about 7/10 in severity,burning and aching in nature. She is [employer] administrator.  Current Outpatient Prescriptions on File Prior to Visit  Medication Sig Dispense Refill  . aspirin 81 MG tablet Take 81 mg by mouth daily.      . hyoscyamine (LEVSIN, ANASPAZ) 0.125 MG tablet Take 1 tablet (0.125 mg total) by mouth every 4 (four) hours as needed for cramping.  30 tablet  0  . ibuprofen (ADVIL,MOTRIN) 600 MG tablet Take 600 mg by mouth every 6 (six) hours as needed. pain       No current facility-administered medications on file prior to visit.   Past Medical History  Diagnosis Date  . Hyperlipidemia   . Gestational diabetes     diet controlled with #3  . Preterm labor     with last 4 preg, del at term  . Urinary tract infection   . Abnormal Pap smear   . PID (pelvic inflammatory disease)   . Hyperlipemia     Review of Systems  Constitutional: Negative for fever.  Respiratory: Negative.   Cardiovascular: Negative.   Gastrointestinal: Negative.   Genitourinary: Negative.   Skin: Positive for color change.       Pain over right lower limb with redness and swelling  All other systems reviewed and are negative.   Filed Vitals:   01/17/13 0909  BP: 125/85  Pulse: 97  Temp: 98.6 F (37 C)  TempSrc: Oral  Height: 5\' 4"  (1.626 m)  Weight: 208 lb (94.348 kg)       Objective:   Physical Exam  Nursing note and vitals reviewed. Constitutional: She appears well-developed. No distress.    Cardiovascular: Normal rate, regular rhythm, normal heart sounds, intact distal pulses and normal pulses.  Exam reveals no decreased pulses.   No murmur heard. Pulmonary/Chest: Effort normal and breath sounds normal. No respiratory distress. She has no wheezes.  Abdominal: Soft. Bowel sounds are normal. She exhibits no distension. There is no tenderness.  Musculoskeletal: She exhibits no edema.       Right lower leg: She exhibits tenderness and swelling.       Left lower leg: Normal.       Legs: Neurological: She is alert.  Skin:          Assessment:     Leg pain,Swelling and Erythema: Cellulitis vs DVT     Plan:     1. Patient already on Antibiotic,advised to complete Clindamycin course.     Warm compression also recommended on right LL.     D-Dimer ordered to assess for DVT.    I called [facility],they will not release U/S for DVT,release of information form filled,I aill await report,if not available will order U/S.  2. Ibuprofen prn leg pain.      More than 30 min was spent on face to face encounter and coordination of care.  [facility],released of information paper work filled.

## 2013-01-17 NOTE — Assessment & Plan Note (Signed)
Ibuprofen recommended prn pain.

## 2013-01-21 ENCOUNTER — Encounter (HOSPITAL_COMMUNITY): Payer: Self-pay | Admitting: Family Medicine

## 2013-01-21 ENCOUNTER — Emergency Department (HOSPITAL_COMMUNITY)
Admission: EM | Admit: 2013-01-21 | Discharge: 2013-01-21 | Disposition: A | Discharge status: Home / Self Care | Payer: Medicaid Other | Attending: Emergency Medicine | Admitting: Emergency Medicine

## 2013-01-21 DIAGNOSIS — Z8751 Personal history of pre-term labor: Secondary | ICD-10-CM | POA: Insufficient documentation

## 2013-01-21 DIAGNOSIS — Z862 Personal history of diseases of the blood and blood-forming organs and certain disorders involving the immune mechanism: Secondary | ICD-10-CM | POA: Insufficient documentation

## 2013-01-21 DIAGNOSIS — R197 Diarrhea, unspecified: Secondary | ICD-10-CM | POA: Insufficient documentation

## 2013-01-21 DIAGNOSIS — Z79899 Other long term (current) drug therapy: Secondary | ICD-10-CM | POA: Insufficient documentation

## 2013-01-21 DIAGNOSIS — IMO0001 Reserved for inherently not codable concepts without codable children: Secondary | ICD-10-CM | POA: Insufficient documentation

## 2013-01-21 DIAGNOSIS — Z87440 Personal history of urinary (tract) infections: Secondary | ICD-10-CM | POA: Insufficient documentation

## 2013-01-21 DIAGNOSIS — Z9089 Acquired absence of other organs: Secondary | ICD-10-CM | POA: Insufficient documentation

## 2013-01-21 DIAGNOSIS — Z8742 Personal history of other diseases of the female genital tract: Secondary | ICD-10-CM | POA: Insufficient documentation

## 2013-01-21 DIAGNOSIS — Z7982 Long term (current) use of aspirin: Secondary | ICD-10-CM | POA: Insufficient documentation

## 2013-01-21 DIAGNOSIS — Z8639 Personal history of other endocrine, nutritional and metabolic disease: Secondary | ICD-10-CM | POA: Insufficient documentation

## 2013-01-21 DIAGNOSIS — L52 Erythema nodosum: Secondary | ICD-10-CM

## 2013-01-21 LAB — BASIC METABOLIC PANEL
BUN: 8 mg/dL (ref 6–23)
CO2: 29 mEq/L (ref 19–32)
Calcium: 9.6 mg/dL (ref 8.4–10.5)
Chloride: 102 mEq/L (ref 96–112)
Creatinine, Ser: 0.73 mg/dL (ref 0.50–1.10)
GFR calc Af Amer: >90 mL/min (ref >90)
GFR calc non Af Amer: >90 mL/min (ref >90)
Glucose, Bld: 158 mg/dL — ABNORMAL HIGH (ref 70–99)
Potassium: 4.4 mEq/L (ref 3.5–5.1)
Sodium: 138 mEq/L (ref 135–145)

## 2013-01-21 LAB — CBC
HCT: 41.1 % (ref 36.0–46.0)
Hemoglobin: 13.9 g/dL (ref 12.0–15.0)
MCH: 29.6 pg (ref 26.0–34.0)
MCHC: 33.8 g/dL (ref 30.0–36.0)
MCV: 87.4 fL (ref 78.0–100.0)
Platelets: 222 10*3/uL (ref 150–400)
RBC: 4.7 MIL/uL (ref 3.87–5.11)
RDW: 12.7 % (ref 11.5–15.5)
WBC: 5.9 10*3/uL (ref 4.0–10.5)

## 2013-01-21 MED ORDER — IBUPROFEN 800 MG PO TABS
800.0000 mg | ORAL_TABLET | Freq: Once | ORAL | Status: AC
  Administered 2013-01-21: 800 mg via ORAL
  Filled 2013-01-21: qty 1

## 2013-01-21 MED ORDER — NAPROXEN 500 MG PO TABS: 500.0000 mg | ORAL_TABLET | Freq: Two times a day (BID) | ORAL | Status: DC

## 2013-01-21 NOTE — ED Notes (Signed)
Per pt sts 11 days of RLE pain, redness and swelling. sts she was seen by her doctor and given 7 days of abx but since last night it has gotten worse and now is in 2 spots.

## 2013-01-21 NOTE — ED Provider Notes (Signed)
History     CSN: [id number]  Arrival date & time 01/21/13  1137   First MD Initiated Contact with Patient 01/21/13 1210      Chief Complaint  Patient presents with  . Leg Swelling    (Consider location/radiation/quality/duration/timing/severity/associated sxs/prior treatment) HPI Pt is a 47yo female c/o RLE pain and redness x11 days.  States the initial pain started as one small red bump on right lower leg, 2 days later it had increased in pain and redness spread. Was evaluated at [facility] where they performed a venous duplex, ruled out blood clot.  Did not find anything on exam.  Pt was told to take motrin.  Pt went back to [facility] for increased redness and pain, this time they performed blood tests and pt was started on antibiotics.  Cannot recall name of medication.  Went back a 3rd time to her regular physician where they performed blood tests to rule out a clot.  Pt was told to keep taking motrin and finish antibiotics.  Pt has now finished antibiotics but pain and redness continue to worsen.  Pain is moderate to severe, 7/10, feels like a burning sensation and occasional tingling.  Worse with walking and weight bearing.  Occasionally feels like skin is going to tear open.  Denies trauma to leg. Denies fever, chills, SOB, nausea or vomiting.Reports did have 3-4 episode of nonbloody diarrhea yesterday.  Was informed abx might do that.    PCP Dr. Lance Muss, he was out of town when pt went to see him for leg pain, saw another physician.  Past Medical History  Diagnosis Date  . Hyperlipidemia   . Gestational diabetes     diet controlled with #3  . Preterm labor     with last 4 preg, del at term  . Urinary tract infection   . Abnormal Pap smear   . PID (pelvic inflammatory disease)   . Hyperlipemia     Past Surgical History  Procedure Laterality Date  . Appendectomy    . Induced abortion      Family History  Problem Relation Age of Onset  . Heart disease Mother   .  Hypertension Mother   . Diabetes Father   . Heart disease Father   . Anesthesia problems Neg Hx     History  Substance Use Topics  . Smoking status: Never Smoker   . Smokeless tobacco: Never Used  . Alcohol Use: No    OB History   Grav Para Term Preterm Abortions TAB SAB Ect Mult Living   9 6 6  0 2 1 1  0 0 6      Review of Systems  Constitutional: Negative for fever and chills.  Cardiovascular: Positive for leg swelling ( mild in right lower leg).  Gastrointestinal: Positive for diarrhea. Negative for nausea and vomiting.  Musculoskeletal: Positive for myalgias ( right calf).  Skin: Positive for color change ( right lower leg).  All other systems reviewed and are negative.    Allergies  Sulfamethoxazole w-trimethoprim  Home Medications   Current Outpatient Rx  Name  Route  Sig  Dispense  Refill  . aspirin 81 MG tablet   Oral   Take 81 mg by mouth daily.         . hyoscyamine (LEVSIN, ANASPAZ) 0.125 MG tablet   Oral   Take 1 tablet (0.125 mg total) by mouth every 4 (four) hours as needed for cramping.   30 tablet   0   . ibuprofen (  ADVIL,MOTRIN) 200 MG tablet   Oral   Take 600 mg by mouth every 6 (six) hours as needed for pain.         . naproxen (NAPROSYN) 500 MG tablet   Oral   Take 1 tablet (500 mg total) by mouth 2 (two) times daily with a meal.   30 tablet   0     BP 124/74  Pulse 85  Temp(Src) 98.4 F (36.9 C) (Oral)  Resp 16  SpO2 99%  LMP 01/02/2013  Physical Exam  Nursing note and vitals reviewed. Constitutional: She appears well-developed and well-nourished. No distress.  HENT:  Head: Normocephalic and atraumatic.  Eyes: Conjunctivae are normal. No scleral icterus.  Neck: Normal range of motion.  Cardiovascular: Normal rate, regular rhythm and normal heart sounds.   Pulmonary/Chest: Effort normal and breath sounds normal. No respiratory distress. She has no wheezes. She has no rales. She exhibits no tenderness.  Abdominal: Soft.  Bowel sounds are normal. She exhibits no distension. There is no tenderness.  Musculoskeletal: Normal range of motion. She exhibits edema ( mild, right lower leg) and tenderness ( right lower leg).  Neurological: She is alert.  Skin: Skin is warm and dry. She is not diaphoretic. There is erythema ( right lower leg).    ED Course  Procedures (including critical care time)  Labs Reviewed  BASIC METABOLIC PANEL - Abnormal; Notable for the following:    Glucose, Bld 158 (*)    All other components within normal limits  CBC   No results found.   1. Erythema nodosum       MDM  Pt c/o right lower leg pain x11 days.  Had venous duplex at [facility] 8 days ago to r/o DVT.  Pt states test was negative.  Was given antibiotics and told to take Motrin.  Was given tramadol for pain but did not like how it made her feel so she does not take it.  Today pt is afebrile, vitals are nl.  Will obtain more blood work (unable to see previous labs).  Pt declined pain medication at this time.    CBC and BMP: unremarkable.  No sign of leukocytosis.  Cellulitis unlikely.   Due to recent negative venous duplex do not believe repeat is needed at this time.   Dr. 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  Will have pt f/u with Dr. Deirdre Priest, PCP, next week.  Symptoms should start to improve within the next 1-2 weeks.  Continue taking antiinflammatories. Rx: naproxen 500mg .  Vitals: unremarkable. Discharged in stable condition.    Discussed pt with attending during ED encounter.         Junius Finner, PA-C 01/21/13 1607

## 2013-01-22 NOTE — ED Provider Notes (Signed)
Medical screening examination/treatment/procedure(s) were conducted as a shared visit with non-physician practitioner(s) and myself.  I personally evaluated the patient during the encounter   Loren Racer, MD 01/22/13 [phone]

## 2013-01-31 ENCOUNTER — Encounter: Payer: Self-pay | Admitting: Family Medicine

## 2013-01-31 ENCOUNTER — Ambulatory Visit (INDEPENDENT_AMBULATORY_CARE_PROVIDER_SITE_OTHER): Payer: Medicaid Other | Admitting: Family Medicine

## 2013-01-31 VITALS — BP 130/85 | HR 106 | Temp 98.1°F | Ht 64.0 in | Wt 206.0 lb

## 2013-01-31 DIAGNOSIS — R0602 Shortness of breath: Secondary | ICD-10-CM | POA: Insufficient documentation

## 2013-01-31 DIAGNOSIS — M7989 Other specified soft tissue disorders: Secondary | ICD-10-CM

## 2013-01-31 DIAGNOSIS — R599 Enlarged lymph nodes, unspecified: Secondary | ICD-10-CM

## 2013-01-31 DIAGNOSIS — L52 Erythema nodosum: Secondary | ICD-10-CM

## 2013-01-31 DIAGNOSIS — R590 Localized enlarged lymph nodes: Secondary | ICD-10-CM

## 2013-01-31 DIAGNOSIS — L989 Disorder of the skin and subcutaneous tissue, unspecified: Secondary | ICD-10-CM | POA: Insufficient documentation

## 2013-01-31 NOTE — Assessment & Plan Note (Signed)
Might be part of EN due to autoimmune diseases. ANA,RF,TSH checked today. Pain medicine as needed,already on naproxen,may continue this.

## 2013-01-31 NOTE — Progress Notes (Signed)
Subjective:     Patient ID: Haley Mclaughlin, female   DOB: 07/01/1966, 47 y.o.   MRN: [id number]  HPI LL pain and swelling: Patient is here to follow up for her right LL shin pain,redness and swelling which had not improved for the last 3 wks now,she had completed A/B treatment,currently on Naproxen prn pain,she was recently at [facility] and [facility]. At [facility] multiple blood work and imaging was done and was advised to follow up with her PCP. Skin nodule: In the last 1 wk she notice thick painful swelling under her skin,mostly of upper limb but now spreading to her LL,back,she denies change in diet,had been on Naproxyn prn and completed Clindamycin,no other medication change,she denies fever,at times will feel weak and tired,occassionally has joint pain. NUU:VOZDG SOB at times without any triggering factor,she denies chest pain,no chest tightness,no wheezing. Past Medical History  Diagnosis Date  . Hyperlipidemia   . Gestational diabetes     diet controlled with #3  . Preterm labor     with last 4 preg, del at term  . Urinary tract infection   . Abnormal Pap smear   . PID (pelvic inflammatory disease)   . Hyperlipemia    Current Outpatient Prescriptions on File Prior to Visit  Medication Sig Dispense Refill  . aspirin 81 MG tablet Take 81 mg by mouth daily.      . hyoscyamine (LEVSIN, ANASPAZ) 0.125 MG tablet Take 1 tablet (0.125 mg total) by mouth every 4 (four) hours as needed for cramping.  30 tablet  0  . ibuprofen (ADVIL,MOTRIN) 200 MG tablet Take 600 mg by mouth every 6 (six) hours as needed for pain.      . naproxen (NAPROSYN) 500 MG tablet Take 1 tablet (500 mg total) by mouth 2 (two) times daily with a meal.  30 tablet  0   No current facility-administered medications on file prior to visit.    Review of Systems  Constitutional: Positive for fatigue. Negative for fever.  Respiratory: Positive for shortness of breath. Negative for cough and wheezing.   Cardiovascular: Negative for  chest pain and palpitations.  Gastrointestinal: Negative.   Genitourinary: Negative.   Skin: Positive for rash.       Skin nodule.   All other systems reviewed and are negative.   Filed Vitals:   01/31/13 1120  BP: 130/85  Pulse: 106  Temp: 98.1 F (36.7 C)  TempSrc: Oral  Height: 5\' 4"  (1.626 m)  Weight: 206 lb (93.441 kg)       Objective:   Physical Exam  Nursing note and vitals reviewed. Constitutional: She appears well-developed. No distress.  Cardiovascular: Normal rate, regular rhythm, normal heart sounds and intact distal pulses.   No murmur heard. Pulmonary/Chest: Effort normal and breath sounds normal. No respiratory distress. She has no wheezes.  Abdominal: Soft. Bowel sounds are normal. She exhibits no distension. There is no tenderness.  Musculoskeletal: Normal range of motion. She exhibits no edema.       Legs: Skin:  Multiple round,firm,non-mobile,tender nodular subcutaneous lesion all over her skin mostly in her upper limbs       Assessment/Plan:     RLL pain and swelling: Erythema nodosum Subcutaneous nodule: Part of EN vs other autoimmune dx SOB: Hilar lymphadenopathy

## 2013-01-31 NOTE — Patient Instructions (Addendum)
Erythema Nodosum  Erythema nodosum is also called "painful red nodules on the legs". Symptoms can include sudden onset of fever, fatigue and joint pains. Red, deep, tender, raised, bruise-like bumps form on the shin, or sometimes on the arms or trunk. The skin over the bumps is usually shiny. The symptoms usually last 1-2 weeks. The symptoms usually improve once the cause is treated. This illness may be caused by strep or fungal infection, drug reaction, pregnancy, or other medical conditions. Treating the cause is important to early recovery. Erythema nodosum occurs at any age and in both sexes but more often in young adult women.  Erythema nodosum is not a skin infection. It is a reaction to something internal. In most cases the illness and bumps go away with treatment. You should avoid any medicine that may have caused this reaction. Antihistamine drugs, anti-inflammatory drugs or cortisone medicine may be prescribed to relieve symptoms. SSKI drops (Pima) taken with juice at breakfast, lunch and dinner may have an anti-inflammatory effect and speed the healing. Bed rest and limiting vigorous exercise help to shorten the course of erythema nodosum. Elevation of the affected limb also helps in recovery.  As the condition gets better, the bumps flatten and your skin may heal with temporary bruise marks. These dark marks will clear up in several months and they are a good sign that your skin is healing.   SEEK IMMEDIATE MEDICAL CARE IF:   · Your condition worsens, or if you have more severe symptoms such a high fever, sore throat, or repeated vomiting.  Document Released: 09/24/2004 Document Revised: 11/09/2011 Document Reviewed: 09/28/2008  ExitCare® Patient Information ©2014 [employer].

## 2013-01-31 NOTE — Assessment & Plan Note (Addendum)
Currently asymptomatic. I reviewed her [facility] record after having her sign release of information form. CT chest with IV contrast done on 01/27/13 showed diffused symmetrical hilar and mediastinal lymphadenopathy. This can be been in Sarcoidosis vs lymphoma. Serum ACE I checked today to r/o Sarcoidosis,ANA and RF checked for SLE. She was referred to pulmonologist at [facility] but requested referral to [facility]. Referral done today. I will call her with test result. Advised to call or follow up soon if symptom worsens otherwise I will see her in 2 wks.

## 2013-01-31 NOTE — Assessment & Plan Note (Signed)
Etiology unclear. Swelling and redness not worsening. 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 We again called [facility] for her LL U/S which they enventually faxed to [place] today. DVT was neg and last D-dimer checked was normal. For now we will monitor for improvement. Continue pain medicine as needed. F/U in 2wks for reassessment.

## 2013-02-03 ENCOUNTER — Other Ambulatory Visit: Payer: Self-pay | Admitting: Family Medicine

## 2013-02-03 MED ORDER — HYDROCODONE-ACETAMINOPHEN 10-325 MG PO TABS: 1.0000 | ORAL_TABLET | Freq: Three times a day (TID) | ORAL | Status: DC | PRN

## 2013-02-03 NOTE — Progress Notes (Signed)
Medication called in at pcp request.  Will ask front desk to make appt for pt.

## 2013-02-03 NOTE — Progress Notes (Signed)
Please Call in her Rx for for vicodin to [facility] on [address] AND Dble book her an appointment at 11 am with me on Monday  Thanks  LC

## 2013-02-06 ENCOUNTER — Encounter: Payer: Self-pay | Admitting: Family Medicine

## 2013-02-06 ENCOUNTER — Ambulatory Visit (INDEPENDENT_AMBULATORY_CARE_PROVIDER_SITE_OTHER): Payer: Medicaid Other | Admitting: Family Medicine

## 2013-02-06 VITALS — BP 128/82 | HR 85 | Temp 98.5°F | Ht 64.0 in | Wt 206.0 lb

## 2013-02-06 DIAGNOSIS — J8289 Other pulmonary eosinophilia, not elsewhere classified: Secondary | ICD-10-CM

## 2013-02-06 DIAGNOSIS — D869 Sarcoidosis, unspecified: Secondary | ICD-10-CM | POA: Insufficient documentation

## 2013-02-06 DIAGNOSIS — R0602 Shortness of breath: Secondary | ICD-10-CM

## 2013-02-06 DIAGNOSIS — M79609 Pain in unspecified limb: Secondary | ICD-10-CM

## 2013-02-06 DIAGNOSIS — Z111 Encounter for screening for respiratory tuberculosis: Secondary | ICD-10-CM

## 2013-02-06 NOTE — Patient Instructions (Addendum)
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  It has a good chance of going away completely.    We will place a PPD - come back to have it read

## 2013-02-06 NOTE — Progress Notes (Signed)
  Subjective:    Patient ID: Haley Mclaughlin, female    DOB: 01-Jul-1966, 47 y.o.   MRN: [id number]  HPI  Erythema Nodosum Slowly improving.  Less sore and red and no new lesions for one week.  No fever or discharge  Joint Pain Very bad first 30 min when awake stiff and sore.  Better with NSAIDs.  Feels her ankles are swelling more.  No redness or joint enlargement of hands.  No new rash  No eye symptoms No weight loss  Shortness of breath  Still present.  Feels like her lungs are full.  No change with position.  Does not keep her from usual activities.  No sputum or hemoptysis or wheeze  PMH - her exhusband (hispanic) was PPD positive and treated.  She was PPD negative 8 years ago when tested during pregnancy  Review of Systems     Objective:   Physical Exam Alert no acute distress 95% on room air walking around clinic Lungs:  Normal respiratory effort, chest expands symmetrically. Lungs are clear to auscultation, no crackles or wheezes. Heart - Regular rate and rhythm.  No murmurs, gallops or rubs.    Neck:  No deformities, thyromegaly, masses, or tenderness noted.   Supple with full range of motion without pain. Mouth - no lesions, mucous membranes are moist, no decaying teeth  Skin - vaguely defined soft tissue swellings with redness on right ant shin, left forearm from 3-4 cm  Small vesicles on finger tips. Nonpitting edema around both ankle 1-2+  No definite ankle erythema or arthritis       Assessment & Plan:

## 2013-02-06 NOTE — Assessment & Plan Note (Signed)
Likely due to hilar lung findings.  No hypoxia or bronchospasm on exam.  Monitor

## 2013-02-06 NOTE — Assessment & Plan Note (Addendum)
Potential diagnosis.  Would fit with hilar adenopathy seen on CT at [facility] and erythema nodosum and her european ancestory.  No signs of TB but will place PPD.  Her ANA and RF were normal but ACE was elevated  Has appointment with pulmonary in 2 weeks.  Continue ibuprofen for pain.

## 2013-02-06 NOTE — Assessment & Plan Note (Signed)
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  Continue NSAID

## 2013-02-08 ENCOUNTER — Encounter: Payer: Self-pay | Admitting: *Deleted

## 2013-02-08 ENCOUNTER — Ambulatory Visit (INDEPENDENT_AMBULATORY_CARE_PROVIDER_SITE_OTHER): Payer: Medicaid Other | Admitting: *Deleted

## 2013-02-08 DIAGNOSIS — Z111 Encounter for screening for respiratory tuberculosis: Secondary | ICD-10-CM

## 2013-02-08 LAB — TB SKIN TEST
Induration: 0 mm
TB Skin Test: NEGATIVE

## 2013-02-08 NOTE — Progress Notes (Signed)
PPD Reading Note PPD read and results entered in EpicCare. Result: 0 mm induration. Interpretation: 0 Letter given stating results to pt. Wyatt Haste, RN-BSN

## 2013-02-09 ENCOUNTER — Emergency Department (HOSPITAL_COMMUNITY): Payer: Medicaid Other

## 2013-02-09 ENCOUNTER — Emergency Department (HOSPITAL_COMMUNITY)
Admission: EM | Admit: 2013-02-09 | Discharge: 2013-02-09 | Disposition: A | Discharge status: Home / Self Care | Payer: Medicaid Other | Attending: Emergency Medicine | Admitting: Emergency Medicine

## 2013-02-09 ENCOUNTER — Encounter (HOSPITAL_COMMUNITY): Payer: Self-pay | Admitting: Adult Health

## 2013-02-09 DIAGNOSIS — Z8742 Personal history of other diseases of the female genital tract: Secondary | ICD-10-CM | POA: Insufficient documentation

## 2013-02-09 DIAGNOSIS — Z79899 Other long term (current) drug therapy: Secondary | ICD-10-CM | POA: Insufficient documentation

## 2013-02-09 DIAGNOSIS — Z888 Allergy status to other drugs, medicaments and biological substances status: Secondary | ICD-10-CM | POA: Insufficient documentation

## 2013-02-09 DIAGNOSIS — M25569 Pain in unspecified knee: Secondary | ICD-10-CM | POA: Insufficient documentation

## 2013-02-09 DIAGNOSIS — S99929A Unspecified injury of unspecified foot, initial encounter: Secondary | ICD-10-CM | POA: Insufficient documentation

## 2013-02-09 DIAGNOSIS — R000 Tachycardia, unspecified: Secondary | ICD-10-CM | POA: Insufficient documentation

## 2013-02-09 DIAGNOSIS — R269 Unspecified abnormalities of gait and mobility: Secondary | ICD-10-CM | POA: Insufficient documentation

## 2013-02-09 DIAGNOSIS — S8990XA Unspecified injury of unspecified lower leg, initial encounter: Secondary | ICD-10-CM | POA: Insufficient documentation

## 2013-02-09 DIAGNOSIS — Z791 Long term (current) use of non-steroidal anti-inflammatories (NSAID): Secondary | ICD-10-CM | POA: Insufficient documentation

## 2013-02-09 DIAGNOSIS — R296 Repeated falls: Secondary | ICD-10-CM | POA: Insufficient documentation

## 2013-02-09 DIAGNOSIS — S8992XA Unspecified injury of left lower leg, initial encounter: Secondary | ICD-10-CM

## 2013-02-09 DIAGNOSIS — Y998 Other external cause status: Secondary | ICD-10-CM | POA: Insufficient documentation

## 2013-02-09 DIAGNOSIS — M25469 Effusion, unspecified knee: Secondary | ICD-10-CM | POA: Insufficient documentation

## 2013-02-09 DIAGNOSIS — E785 Hyperlipidemia, unspecified: Secondary | ICD-10-CM | POA: Insufficient documentation

## 2013-02-09 DIAGNOSIS — Y929 Unspecified place or not applicable: Secondary | ICD-10-CM | POA: Insufficient documentation

## 2013-02-09 MED ORDER — NAPROXEN 500 MG PO TABS: 500.0000 mg | ORAL_TABLET | Freq: Two times a day (BID) | ORAL | Status: DC

## 2013-02-09 NOTE — ED Provider Notes (Signed)
History     CSN: [id number]  Arrival date & time 02/09/13  1747   First MD Initiated Contact with Patient 02/09/13 1941      Chief Complaint  Patient presents with  . Knee Injury    (Consider location/radiation/quality/duration/timing/severity/associated sxs/prior treatment) The history is provided by the patient and medical records. No language interpreter was used.    Haley Mclaughlin is a 47 y.o. female  with a hx of PID presents to the Emergency Department complaining of acute, persistent,pain in the L knee after twisting it and falling into her car beginning at 12 noon.  Pt denies hitting her head, LOC, neck or back pain.  Pt denies hx of knee problems.   Associated symptoms include pain and burning in the knee.  Pt has been able to ambulate, but reports pain.  Pt took ibuprofen which helped the pain only slightly and walking makes the pain much worse.     Past Medical History  Diagnosis Date  . Hyperlipidemia   . Gestational diabetes     diet controlled with #3  . Preterm labor     with last 4 preg, del at term  . Urinary tract infection   . Abnormal Pap smear   . PID (pelvic inflammatory disease)   . Hyperlipemia     Past Surgical History  Procedure Laterality Date  . Appendectomy    . Induced abortion      Family History  Problem Relation Age of Onset  . Heart disease Mother   . Hypertension Mother   . Diabetes Father   . Heart disease Father   . Anesthesia problems Neg Hx     History  Substance Use Topics  . Smoking status: Never Smoker   . Smokeless tobacco: Never Used  . Alcohol Use: No    OB History   Grav Para Term Preterm Abortions TAB SAB Ect Mult Living   9 6 6  0 2 1 1  0 0 6      Review of Systems  HENT: Negative for neck pain and neck stiffness.   Musculoskeletal: Positive for joint swelling, arthralgias and gait problem (2/2 pain). Negative for back pain.  Skin: Negative for wound.  Neurological: Negative for numbness.  All other  systems reviewed and are negative.    Allergies  Sulfamethoxazole w-trimethoprim  Home Medications   Current Outpatient Rx  Name  Route  Sig  Dispense  Refill  . aspirin 81 MG tablet   Oral   Take 81 mg by mouth daily.         Marland Kitchen HYDROcodone-acetaminophen (NORCO) 10-325 MG per tablet   Oral   Take 1 tablet by mouth every 8 (eight) hours as needed for pain.   30 tablet   0   . ibuprofen (ADVIL,MOTRIN) 200 MG tablet   Oral   Take 600 mg by mouth every 6 (six) hours as needed for pain.         . naproxen (NAPROSYN) 500 MG tablet   Oral   Take 1 tablet (500 mg total) by mouth 2 (two) times daily with a meal.   30 tablet   0     BP 128/82  Pulse 105  Temp(Src) 99.4 F (37.4 C) (Oral)  Resp 18  Ht 5\' 4"  (1.626 m)  Wt 206 lb (93.441 kg)  BMI 35.34 kg/m2  SpO2 99%  LMP 01/02/2013  Physical Exam  Nursing note and vitals reviewed. Constitutional: She appears well-developed and well-nourished. No distress.  HENT:  Head: Normocephalic and atraumatic.  Eyes: Conjunctivae are normal.  Neck: Normal range of motion.  Cardiovascular: Regular rhythm, S1 normal, S2 normal, normal heart sounds and intact distal pulses.  Tachycardia present.   Pulses:      Radial pulses are 2+ on the right side, and 2+ on the left side.       Dorsalis pedis pulses are 2+ on the right side, and 2+ on the left side.       Posterior tibial pulses are 2+ on the right side, and 2+ on the left side.  Capillary refill < 3 sec  Pulmonary/Chest: Effort normal and breath sounds normal.  Musculoskeletal: She exhibits tenderness. She exhibits no edema.       Left knee: She exhibits swelling (mild) and effusion (small). She exhibits normal range of motion, no ecchymosis, no deformity, no laceration, no erythema, normal alignment, normal patellar mobility and no bony tenderness. Tenderness found. Medial joint line tenderness noted.  ROM: Full ROM of the knees bilaterally  Neurological: She is alert.  Coordination normal. GCS eye subscore is 4. GCS verbal subscore is 5. GCS motor subscore is 6.  Sensation intact to dull and sharp Strength 3/5 limited by pain  Skin: Skin is warm and dry. She is not diaphoretic.  No tenting of the skin  Psychiatric: She has a normal mood and affect.    ED Course  Procedures (including critical care time)  Labs Reviewed - No data to display Dg Knee Complete 4 Views Left  02/09/2013   *RADIOLOGY REPORT*  Clinical Data: Left knee pain following a twisting injury.  LEFT KNEE - COMPLETE 4+ VIEW  Comparison: None.  Findings: Normal appearing bones and soft tissues without fracture, dislocation or effusion.  IMPRESSION: Normal examination.   Original Report Authenticated By: Beckie Salts, M.D.     1. Knee injury, left, initial encounter       MDM  Haley Mclaughlin presents after twisting her L knee.  Patient X-Ray negative for obvious fracture or dislocation. I personally reviewed the imaging tests through PACS system.  I reviewed available ER/hospitalization records through the EMR.  Pt declined pain management in the ED.  Record review shows that pt was given Norco #30 on 02/04/11 by her PCP. Pt advised to follow up with orthopedics if symptoms persist for further evaluation for the possibility of torn meniscus.  Patient given knee immobilizer brace while in ED, conservative therapy recommended and discussed. Patient will be dc home & is agreeable with above plan.  I have also discussed reasons to return immediately to the ER.  Patient expresses understanding and agrees with plan.       Haley Mclaughlin Pasquarelli, PA-C 02/09/13 1955

## 2013-02-09 NOTE — ED Notes (Signed)
Presents with left knee injury that occurred while getting into her car. She states, "I went to get in the car and I twisted my knee" left lower foot swollen and pt describes pain as burning and pulling. CMS intact. Pain iw worse when bending knee.

## 2013-02-09 NOTE — ED Notes (Signed)
The patient is AOx4 and comfortable with the discharge instructions. 

## 2013-02-10 NOTE — ED Provider Notes (Signed)
Medical screening examination/treatment/procedure(s) were performed by non-physician practitioner and as supervising physician I was immediately available for consultation/collaboration.   Nicha Hemann III, MD 02/10/13 1222 

## 2013-02-20 ENCOUNTER — Ambulatory Visit: Payer: Medicaid Other | Admitting: Family Medicine

## 2013-02-21 ENCOUNTER — Institutional Professional Consult (permissible substitution): Payer: Medicaid Other | Admitting: Pulmonary Disease

## 2013-03-20 ENCOUNTER — Encounter: Payer: Self-pay | Admitting: Family Medicine

## 2013-03-20 ENCOUNTER — Ambulatory Visit (INDEPENDENT_AMBULATORY_CARE_PROVIDER_SITE_OTHER): Payer: Medicaid Other | Admitting: Family Medicine

## 2013-03-20 VITALS — BP 123/83 | HR 75 | Temp 98.2°F | Ht 64.0 in | Wt 205.0 lb

## 2013-03-20 DIAGNOSIS — R300 Dysuria: Secondary | ICD-10-CM

## 2013-03-20 DIAGNOSIS — R0602 Shortness of breath: Secondary | ICD-10-CM

## 2013-03-20 DIAGNOSIS — J8289 Other pulmonary eosinophilia, not elsewhere classified: Secondary | ICD-10-CM

## 2013-03-20 LAB — POCT URINALYSIS DIPSTICK
Glucose, UA: NEGATIVE
Nitrite, UA: NEGATIVE
Urobilinogen, UA: 1

## 2013-03-20 LAB — POCT UA - MICROSCOPIC ONLY: WBC, Ur, HPF, POC: >20

## 2013-03-20 NOTE — Patient Instructions (Addendum)
A general opthalmogologist would be fine for your eye exam  If you need a sedative for your MRI - contact your pulmonologist who ordered it  Continue regular exercise now that your sarcoid is improving  I will call you with the results of your urine test

## 2013-03-20 NOTE — Assessment & Plan Note (Signed)
No signs of CHF.  Doubt CAD given recent negative EST and that her symptoms are mainly at rest.  Does not seem to have hypoxia and normal PFTs by patient report.  Has long history of anxiety which maybe causing these symptoms

## 2013-03-20 NOTE — Progress Notes (Signed)
  Subjective:    Patient ID: Haley Mclaughlin, female    DOB: [birth year], 47 y.o.   MRN: [id number]  HPI  Dysuria and abdomen pain Having bilateral lower abdomen pressure pain associated with frequency dysuria and red urine.  Took pyridium otc that helped but still red.  No fever or bck pain or nausea or vomiting (did get over recent gastroenteritis)   Sarcoid Being worked up at [facility].  Bx was consistent with sarcoid.  Her skin lesions have mostly resolved.  Still with shortness of breath appartently scheduled for cardiac MRI and had PFTs.  Work done by pulmonology.  Had recent cardiac stress test about 5 mo ago was normal.  Mild evening pedal edema. Rheum apparently starting her on primaquine   Review of Symptoms - see HPI  PMH - Smoking status noted.    Review of Systems     Objective:   Physical Exam Alert no acute distress Heart - Regular rate and rhythm.  No murmurs, gallops or rubs.    Lungs:  Normal respiratory effort, chest expands symmetrically. Lungs are clear to auscultation, no crackles or wheezes. Extremities:  No cyanosis, edema, or deformity noted with good range of motion of all major joints.    Skin well healed bx site.  Nodules have resolved      Assessment & Plan:

## 2013-03-21 ENCOUNTER — Telehealth: Payer: Self-pay | Admitting: Family Medicine

## 2013-03-21 MED ORDER — CIPROFLOXACIN HCL 250 MG PO TABS: 250.0000 mg | ORAL_TABLET | Freq: Two times a day (BID) | ORAL | Status: DC

## 2013-03-21 NOTE — Assessment & Plan Note (Signed)
Consistent with uti will treat with cipro

## 2013-03-21 NOTE — Assessment & Plan Note (Signed)
Prescribed placquenil but [facility] but has not started.  Symptoms other the dyspnea are resolving as expected with Loefflers

## 2013-03-21 NOTE — Telephone Encounter (Signed)
Attempt to call left voicemail   Spoke with her still intermittent lower abdomen cramp and dysuria  No fever or nausea or vomiting   Will treat for 3 days

## 2013-04-26 IMAGING — CR DG WRIST COMPLETE 3+V*R*
4 series · 4 of 4 positions shown · non-contrast
Comparison: 04/16/2009

CLINICAL DATA: Fall, posterior wrist pain.

RIGHT WRIST - COMPLETE 3+ VIEW

[x wrist pa right]
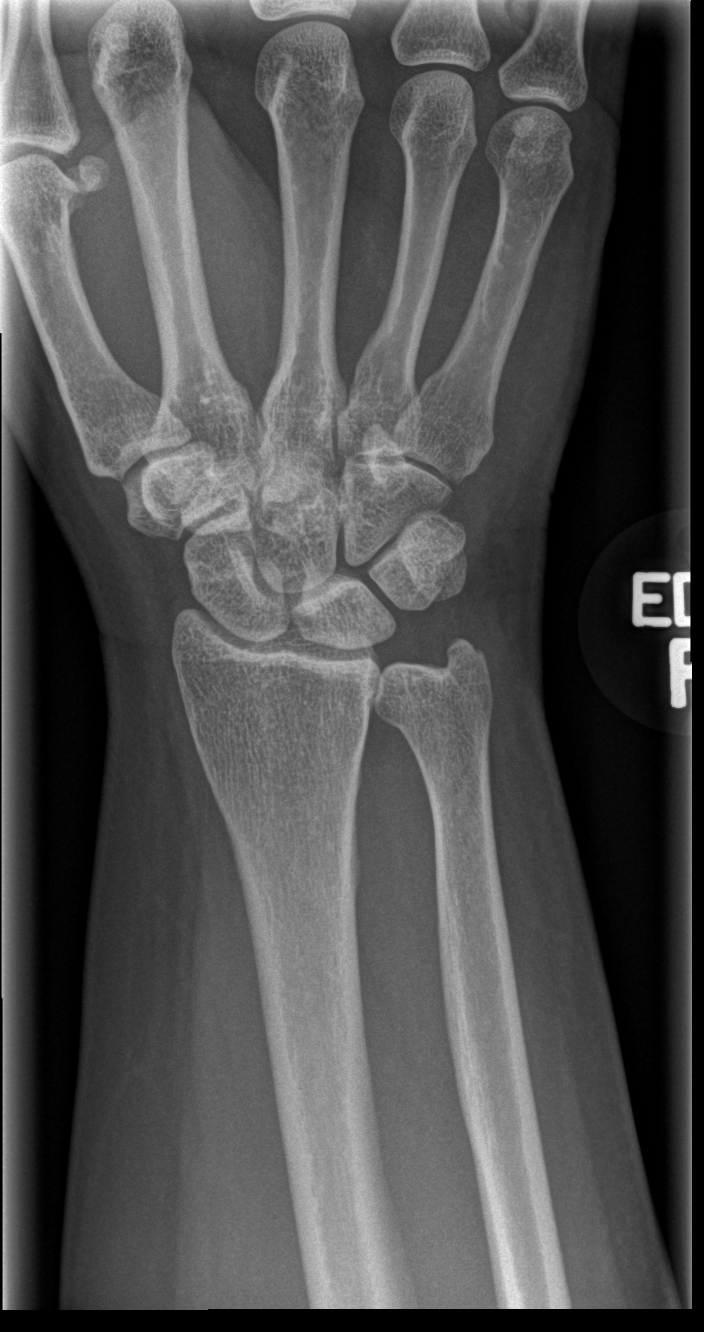

[x wrist obl right]
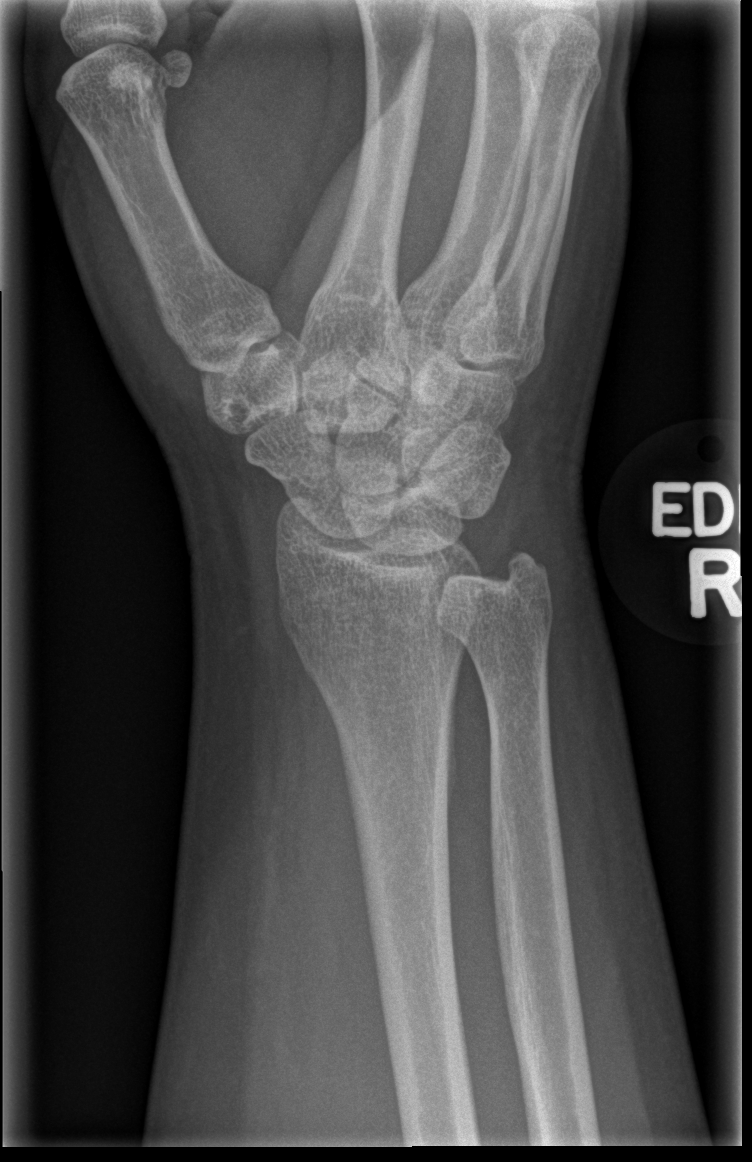

[x wrist lat right]
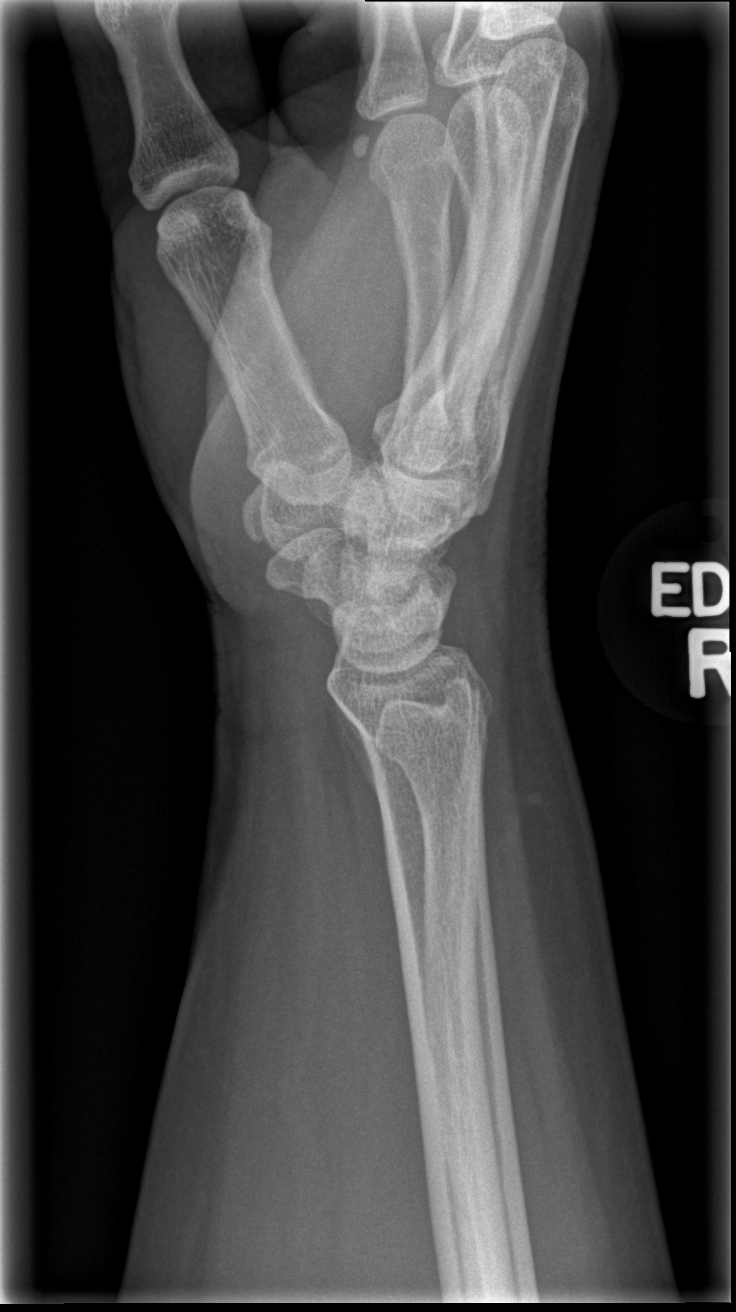

[x wrist navicular view right]
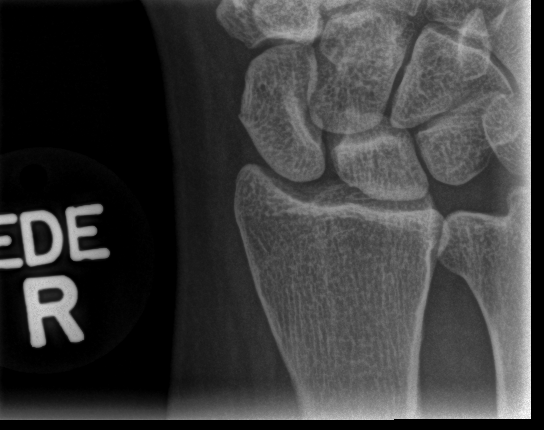

[4 of 4 positions shown; findings below may reference images not displayed]

FINDINGS: No acute bony abnormality.  Specifically, no fracture,
subluxation, or dislocation.  Soft tissues are intact.
IMPRESSION: No acute bony abnormality.

## 2013-05-28 ENCOUNTER — Emergency Department (HOSPITAL_COMMUNITY)
Admission: EM | Admit: 2013-05-28 | Discharge: 2013-05-28 | Disposition: A | Discharge status: Home / Self Care | Payer: Medicaid Other | Attending: Emergency Medicine | Admitting: Emergency Medicine

## 2013-05-28 ENCOUNTER — Encounter (HOSPITAL_COMMUNITY): Payer: Self-pay | Admitting: *Deleted

## 2013-05-28 DIAGNOSIS — Z791 Long term (current) use of non-steroidal anti-inflammatories (NSAID): Secondary | ICD-10-CM | POA: Insufficient documentation

## 2013-05-28 DIAGNOSIS — R42 Dizziness and giddiness: Secondary | ICD-10-CM | POA: Insufficient documentation

## 2013-05-28 DIAGNOSIS — Z8639 Personal history of other endocrine, nutritional and metabolic disease: Secondary | ICD-10-CM | POA: Insufficient documentation

## 2013-05-28 DIAGNOSIS — Z862 Personal history of diseases of the blood and blood-forming organs and certain disorders involving the immune mechanism: Secondary | ICD-10-CM | POA: Insufficient documentation

## 2013-05-28 DIAGNOSIS — R5381 Other malaise: Secondary | ICD-10-CM | POA: Insufficient documentation

## 2013-05-28 DIAGNOSIS — R110 Nausea: Secondary | ICD-10-CM | POA: Insufficient documentation

## 2013-05-28 DIAGNOSIS — Z79899 Other long term (current) drug therapy: Secondary | ICD-10-CM | POA: Insufficient documentation

## 2013-05-28 DIAGNOSIS — Z7982 Long term (current) use of aspirin: Secondary | ICD-10-CM | POA: Insufficient documentation

## 2013-05-28 DIAGNOSIS — Z3202 Encounter for pregnancy test, result negative: Secondary | ICD-10-CM | POA: Insufficient documentation

## 2013-05-28 DIAGNOSIS — K219 Gastro-esophageal reflux disease without esophagitis: Secondary | ICD-10-CM

## 2013-05-28 DIAGNOSIS — Z87440 Personal history of urinary (tract) infections: Secondary | ICD-10-CM | POA: Insufficient documentation

## 2013-05-28 DIAGNOSIS — Z8619 Personal history of other infectious and parasitic diseases: Secondary | ICD-10-CM | POA: Insufficient documentation

## 2013-05-28 DIAGNOSIS — Z8742 Personal history of other diseases of the female genital tract: Secondary | ICD-10-CM | POA: Insufficient documentation

## 2013-05-28 HISTORY — DX: Sarcoidosis, unspecified: D86.9

## 2013-05-28 LAB — URINALYSIS, ROUTINE W REFLEX MICROSCOPIC
Bilirubin Urine: NEGATIVE
Glucose, UA: NEGATIVE mg/dL
Hgb urine dipstick: NEGATIVE
Ketones, ur: NEGATIVE mg/dL
Nitrite: NEGATIVE
Specific Gravity, Urine: 1.008 (ref 1.005–1.030)
pH: 5 (ref 5.0–8.0)

## 2013-05-28 LAB — COMPREHENSIVE METABOLIC PANEL
ALT: 22 U/L (ref 0–35)
AST: 23 U/L (ref 0–37)
Albumin: 4 g/dL (ref 3.5–5.2)
Alkaline Phosphatase: 55 U/L (ref 39–117)
BUN: 9 mg/dL (ref 6–23)
Chloride: 103 mEq/L (ref 96–112)
Creatinine, Ser: 0.66 mg/dL (ref 0.50–1.10)
GFR calc non Af Amer: >90 mL/min (ref >90)
Potassium: 3.7 mEq/L (ref 3.5–5.1)
Sodium: 140 mEq/L (ref 135–145)
Total Bilirubin: 0.4 mg/dL (ref 0.3–1.2)
Total Protein: 8.1 g/dL (ref 6.0–8.3)

## 2013-05-28 LAB — URINE MICROSCOPIC-ADD ON

## 2013-05-28 LAB — CBC WITH DIFFERENTIAL/PLATELET
Basophils Absolute: 0.1 10*3/uL (ref 0.0–0.1)
Eosinophils Absolute: 0.1 10*3/uL (ref 0.0–0.7)
Eosinophils Relative: 2 % (ref 0–5)
HCT: 43.4 % (ref 36.0–46.0)
Hemoglobin: 15.3 g/dL — ABNORMAL HIGH (ref 12.0–15.0)
Lymphocytes Relative: 40 % (ref 12–46)
Lymphs Abs: 3 10*3/uL (ref 0.7–4.0)
MCH: 30.7 pg (ref 26.0–34.0)
MCV: 87.1 fL (ref 78.0–100.0)
Monocytes Absolute: 0.3 10*3/uL (ref 0.1–1.0)
Platelets: 239 10*3/uL (ref 150–400)
RDW: 13.5 % (ref 11.5–15.5)
WBC: 7.6 10*3/uL (ref 4.0–10.5)

## 2013-05-28 LAB — POCT PREGNANCY, URINE: Preg Test, Ur: NEGATIVE

## 2013-05-28 LAB — LIPASE, BLOOD: Lipase: 53 U/L (ref 11–59)

## 2013-05-28 LAB — POCT I-STAT TROPONIN I

## 2013-05-28 MED ORDER — LIDOCAINE VISCOUS 2 % MT SOLN: 15.0000 mL | OROMUCOSAL | Status: DC | PRN

## 2013-05-28 MED ORDER — FAMOTIDINE 20 MG PO TABS
20.0000 mg | ORAL_TABLET | Freq: Once | ORAL | Status: AC
  Administered 2013-05-28: 20 mg via ORAL
  Filled 2013-05-28: qty 1

## 2013-05-28 MED ORDER — GI COCKTAIL ~~LOC~~
30.0000 mL | Freq: Once | ORAL | Status: AC
  Administered 2013-05-28: 30 mL via ORAL
  Filled 2013-05-28: qty 30

## 2013-05-28 MED ORDER — TRAMADOL HCL 50 MG PO TABS: 50.0000 mg | ORAL_TABLET | Freq: Four times a day (QID) | ORAL | Status: DC | PRN

## 2013-05-28 MED ORDER — PANTOPRAZOLE SODIUM 40 MG PO TBEC
40.0000 mg | DELAYED_RELEASE_TABLET | Freq: Once | ORAL | Status: AC
  Administered 2013-05-28: 40 mg via ORAL
  Filled 2013-05-28: qty 1

## 2013-05-28 MED ORDER — FAMOTIDINE 20 MG PO TABS: 40.0000 mg | ORAL_TABLET | Freq: Every evening | ORAL | Status: DC | PRN

## 2013-05-28 MED ORDER — ACETAMINOPHEN 325 MG PO TABS
975.0000 mg | ORAL_TABLET | Freq: Once | ORAL | Status: AC
  Administered 2013-05-28: 975 mg via ORAL
  Filled 2013-05-28: qty 3

## 2013-05-28 NOTE — ED Notes (Signed)
Multiple complaints. Having indigestion x 2 days with no relief with otc meds, an uneasy epigastric pain. Having dizziness and sore throat. ekg done at triage, no acute distress noted.

## 2013-05-28 NOTE — ED Provider Notes (Signed)
CSN: [id number]     Arrival date & time 05/28/13  1752 History   First MD Initiated Contact with Patient 05/28/13 1804     Chief Complaint  Patient presents with  . Abdominal Pain  . Gastrophageal Reflux   (Consider location/radiation/quality/duration/timing/severity/associated sxs/prior Treatment) HPI  Haley Mclaughlin is a 47 y.o. female with past medical history significant for hyperlipidemia and sarcoidosis complaining of severe epigastric pain that radiates up the esophagus associated with dyspepsia and sensation that she is losing her breath. This is been happening for 4 days but worsened significantly in the past 24 hours it is associated with lightheadedness, fatigue and nausea. Patient denies chest pain, change in bowel or bladder habits. She is not a smoker, is not taking excessive NSAIDs, is not a heavy drinker, has never been evaluated by GI or had an endoscopy. She's been taking TUMS at home with little relief.  PCP: [facility]    Past Medical History  Diagnosis Date  . Hyperlipidemia   . Gestational diabetes     diet controlled with #3  . Preterm labor     with last 4 preg, del at term  . Urinary tract infection   . Abnormal Pap smear   . PID (pelvic inflammatory disease)   . Hyperlipemia   . Sarcoidosis    Past Surgical History  Procedure Laterality Date  . Appendectomy    . Induced abortion     Family History  Problem Relation Age of Onset  . Heart disease Mother   . Hypertension Mother   . Diabetes Father   . Heart disease Father   . Anesthesia problems Neg Hx    History  Substance Use Topics  . Smoking status: Never Smoker   . Smokeless tobacco: Never Used  . Alcohol Use: No   OB History   Grav Para Term Preterm Abortions TAB SAB Ect Mult Living   9 6 6  0 2 1 1  0 0 6     Review of Systems  Allergies  Sulfamethoxazole w-trimethoprim  Home Medications   Current Outpatient Rx  Name  Route  Sig  Dispense  Refill  . aspirin EC  81 MG tablet   Oral   Take 81 mg by mouth daily.         . Ca Carbonate-Mag Hydroxide (ROLAIDS PO)   Oral   Take 1 tablet by mouth as needed (acid indigestion).         Marland Kitchen ibuprofen (ADVIL,MOTRIN) 200 MG tablet   Oral   Take 400 mg by mouth 2 (two) times daily.          . ranitidine (ZANTAC) 150 MG tablet   Oral   Take 150 mg by mouth 2 (two) times daily.          BP 147/81  Pulse 95  Temp(Src) 98.1 F (36.7 C) (Oral)  Resp 18  SpO2 97%  LMP 04/27/2013 Physical Exam  Nursing note and vitals reviewed. Constitutional: She is oriented to person, place, and time. She appears well-developed and well-nourished. No distress.  HENT:  Head: Normocephalic.  Mouth/Throat: Oropharynx is clear and moist.  Eyes: Conjunctivae and EOM are normal. Pupils are equal, round, and reactive to light.  Cardiovascular: Normal rate, regular rhythm and intact distal pulses.   Pulmonary/Chest: Effort normal and breath sounds normal. No stridor. No respiratory distress. She has no wheezes. She has no rales. She exhibits no tenderness.  Abdominal: Soft. Bowel sounds are normal. She exhibits no  distension and no mass. There is no tenderness. There is no rebound and no guarding.  Musculoskeletal: Normal range of motion. She exhibits no edema.  Neurological: She is alert and oriented to person, place, and time.  Psychiatric: She has a normal mood and affect.    ED Course  Procedures (including critical care time) Labs Review Labs Reviewed  COMPREHENSIVE METABOLIC PANEL - Abnormal; Notable for the following:    Glucose, Bld 183 (*)    All other components within normal limits  CBC WITH DIFFERENTIAL - Abnormal; Notable for the following:    Hemoglobin 15.3 (*)    All other components within normal limits  URINALYSIS, ROUTINE W REFLEX MICROSCOPIC - Abnormal; Notable for the following:    Color, Urine STRAW (*)    Leukocytes, UA SMALL (*)    All other components within normal limits  URINE  MICROSCOPIC-ADD ON - Abnormal; Notable for the following:    Squamous Epithelial / LPF FEW (*)    Bacteria, UA FEW (*)    All other components within normal limits  URINE CULTURE  LIPASE, BLOOD  POCT I-STAT TROPONIN I  POCT PREGNANCY, URINE   Imaging Review No results found.   Date: 05/28/2013  Rate: 96  Rhythm: normal sinus rhythm  QRS Axis: normal  Intervals: normal  ST/T Wave abnormalities: normal  Conduction Disutrbances:none  Narrative Interpretation:   Old EKG Reviewed: none available   MDM   1. GERD (gastroesophageal reflux disease)     Filed Vitals:   05/28/13 1755 05/28/13 1902  BP: 147/81 137/88  Pulse: 95 80  Temp: 98.1 F (36.7 C)   TempSrc: Oral   Resp: 18   SpO2: 97% 99%     Haley Mclaughlin is a 47 y.o. female with dyspepsia, severe epigastric pain radiating to the throat EKG is nonischemic, troponin is negative. Blood work  Unremarkable and abdominal exam is benign.. This is likely just secondary to severe reflux. She has received significant relief with GI cocktail. However he did wear off. Administered her on Pepcid, center home with viscous lidocaine. I have advised her to follow with her primary care doctor in the next 24-40 hours to discuss endoscopy and GI referral.  This is a shared visit with the attending physician who personally evaluated the patient and agrees with the care plan.    Medications  acetaminophen (TYLENOL) tablet 975 mg (not administered)  gi cocktail (Maalox,Lidocaine,Donnatal) (30 mLs Oral Given 05/28/13 1904)  famotidine (PEPCID) tablet 20 mg (20 mg Oral Given 05/28/13 1903)  pantoprazole (PROTONIX) EC tablet 40 mg (40 mg Oral Given 05/28/13 1903)    Pt is hemodynamically stable, appropriate for, and amenable to discharge at this time. Pt verbalized understanding and agrees with care plan. All questions answered. Outpatient follow-up and specific return precautions discussed.    New Prescriptions   FAMOTIDINE (PEPCID) 20 MG  TABLET    Take 2 tablets (40 mg total) by mouth at bedtime as needed for heartburn.   LIDOCAINE (XYLOCAINE) 2 % SOLUTION    Take 15 mLs by mouth every 3 (three) hours as needed for pain.   TRAMADOL (ULTRAM) 50 MG TABLET    Take 1 tablet (50 mg total) by mouth every 6 (six) hours as needed for pain.    Note: Portions of this report may have been transcribed using voice recognition software. Every effort was made to ensure accuracy; however, inadvertent computerized transcription errors may be present      Wynetta Emery, PA-C 05/28/13  2102 

## 2013-05-28 NOTE — ED Provider Notes (Signed)
Medical screening examination/treatment/procedure(s) were conducted as a shared visit with non-physician practitioner(s) and myself.  I personally evaluated the patient during the encounter  Patient here with 4 days of dyspepsia, GERD symptoms, pressure in epigastrum. Concern for gastritis. Abdominal exam benign. Patient with improved symptoms after GI cocktail. Labs normal, put on PPI. Instructed to f/u with PCP.  Dagmar Hait, MD 05/28/13 [id number]

## 2013-06-05 ENCOUNTER — Encounter: Payer: Self-pay | Admitting: Family Medicine

## 2013-06-05 ENCOUNTER — Other Ambulatory Visit: Payer: Self-pay | Admitting: Family Medicine

## 2013-06-05 ENCOUNTER — Ambulatory Visit (INDEPENDENT_AMBULATORY_CARE_PROVIDER_SITE_OTHER): Payer: Medicaid Other | Admitting: Family Medicine

## 2013-06-05 VITALS — BP 137/88 | HR 73 | Ht 64.0 in | Wt 201.0 lb

## 2013-06-05 DIAGNOSIS — R7309 Other abnormal glucose: Secondary | ICD-10-CM

## 2013-06-05 DIAGNOSIS — R739 Hyperglycemia, unspecified: Secondary | ICD-10-CM | POA: Insufficient documentation

## 2013-06-05 DIAGNOSIS — J8289 Other pulmonary eosinophilia, not elsewhere classified: Secondary | ICD-10-CM

## 2013-06-05 DIAGNOSIS — E1165 Type 2 diabetes mellitus with hyperglycemia: Secondary | ICD-10-CM | POA: Insufficient documentation

## 2013-06-05 DIAGNOSIS — Z23 Encounter for immunization: Secondary | ICD-10-CM

## 2013-06-05 DIAGNOSIS — R1013 Epigastric pain: Secondary | ICD-10-CM

## 2013-06-05 LAB — POCT GLYCOSYLATED HEMOGLOBIN (HGB A1C): Hemoglobin A1C: 5.4

## 2013-06-05 MED ORDER — OMEPRAZOLE 20 MG PO CPDR: 20.0000 mg | DELAYED_RELEASE_CAPSULE | Freq: Two times a day (BID) | ORAL | Status: DC

## 2013-06-05 NOTE — Assessment & Plan Note (Signed)
Most consistent with GERD.  Trial of PPI and monitor

## 2013-06-05 NOTE — Patient Instructions (Addendum)
Eat small frequent meals to avoid filling up your stomach  Take the omeprazole twice daily for 10 days then once a day  If you have any blood in your bowel movement or vomit then call immediately  I will be in touch about your A1c  Heel cup for the plantar fascitis  Contact your Ob gyn for the date of your last pap smear  Come back in 1 month

## 2013-06-05 NOTE — Progress Notes (Signed)
  Subjective:    Patient ID: Haley Mclaughlin, female    DOB: 07-11-1966, 47 y.o.   MRN: [id number]  HPI  Heart Burn Complains of burning sensation in chest frequently worse with bending or lying down.  A little better with antiacids and Pepcid from ER.  No change in diet or in her infrequent use of nsaids. No steroids.   No bleeding or food sticking or lightheadness.  Hyperglycemia Noticed on last bmet in ER.  History of gestational diabetes.  No recent polyuria or dipisia   Review of Symptoms - see HPI  PMH - Smoking status noted.   Never had upper endoscopy   Review of Systems     Objective:   Physical Exam Alert no acute distress Heart - Regular rate and rhythm.  No murmurs, gallops or rubs.    Lungs:  Normal respiratory effort, chest expands symmetrically. Lungs are clear to auscultation, no crackles or wheezes. Abdomen: soft and without masses, organomegaly or hernias noted.  No guarding or rebound.  Mild epigastric tenderness        Assessment & Plan:

## 2013-06-05 NOTE — Assessment & Plan Note (Signed)
At significant risk for diabetes.  Will check A1c

## 2013-06-05 NOTE — Assessment & Plan Note (Signed)
Improving without treatment.  Had normal PFT and cardica MRI at [facility]

## 2013-06-09 IMAGING — US US PELVIS COMPLETE
1 series · 14 of 25 positions shown · non-contrast
Comparison: Pelvic ultrasound performed 03/25/2009

CLINICAL DATA: Intrauterine device removed 2 days ago; recent
positive urine pregnancy test, now negative.

TRANSABDOMINAL AND TRANSVAGINAL ULTRASOUND OF PELVIS
TECHNIQUE: Both transabdominal and transvaginal ultrasound
examinations of the pelvis were performed. Transabdominal technique
was performed for global imaging of the pelvis including uterus,
ovaries, adnexal regions, and pelvic cul-de-sac.

[Series 1: us pelvis complete · 63 acquisitions, 14 frames shown]
[im 1/63]
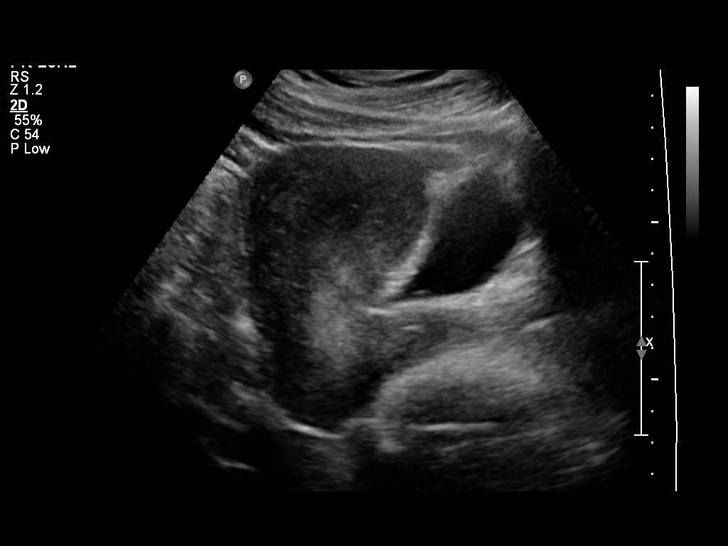
[im 6/63]
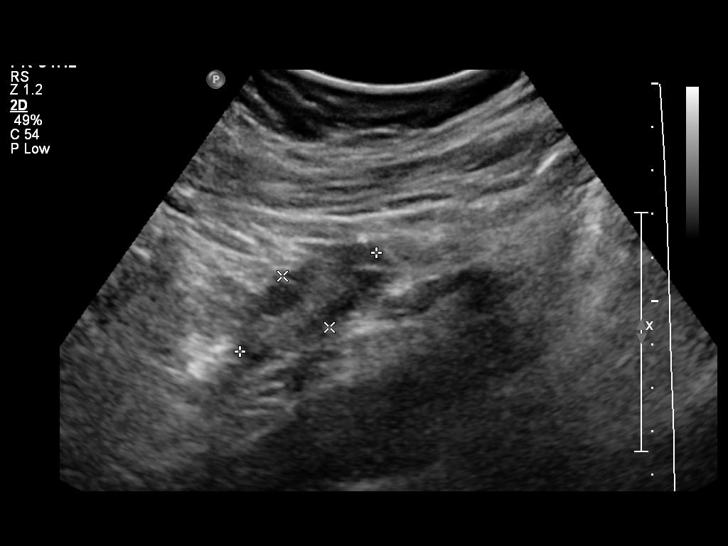
[im 11/63]
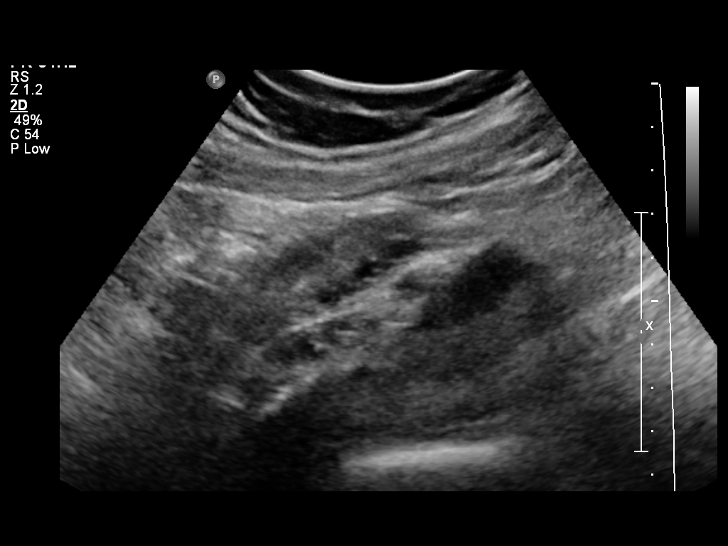
[im 16/63]
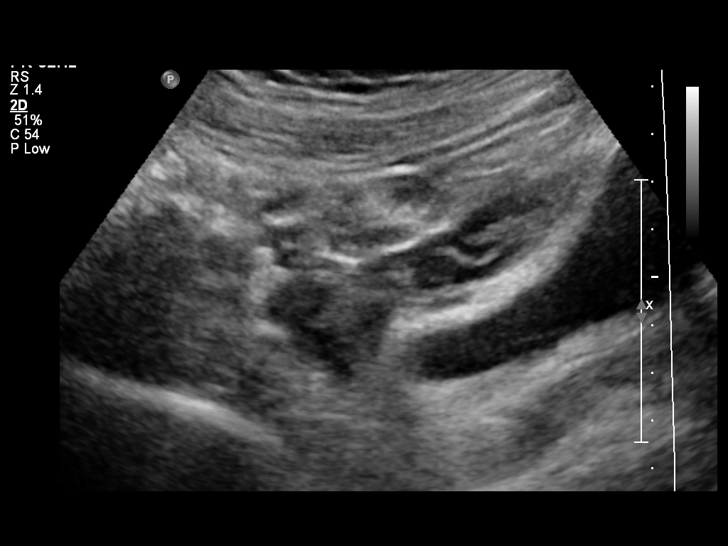
[im 21/63]
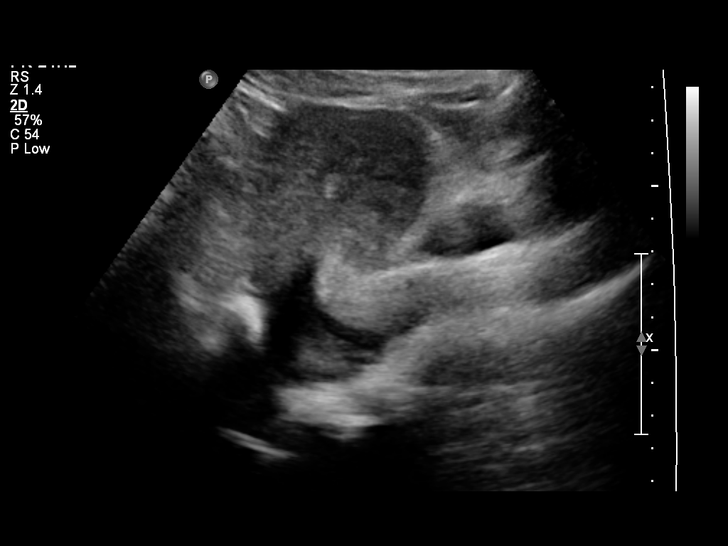
[im 24/63]
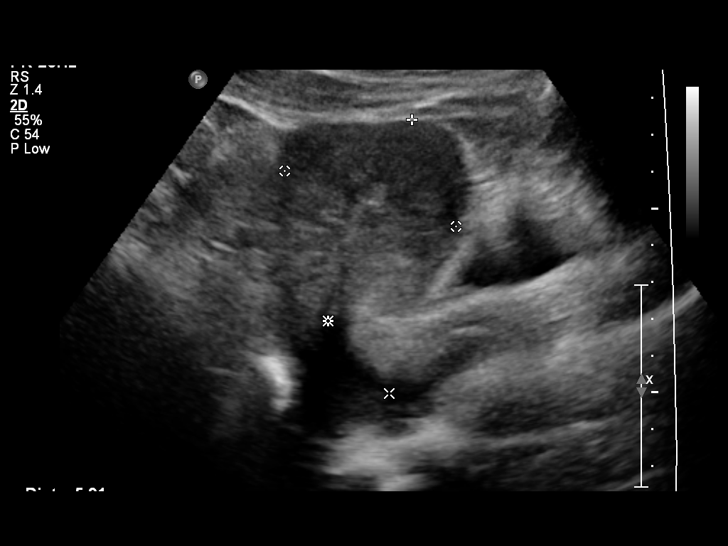
[im 29/63]
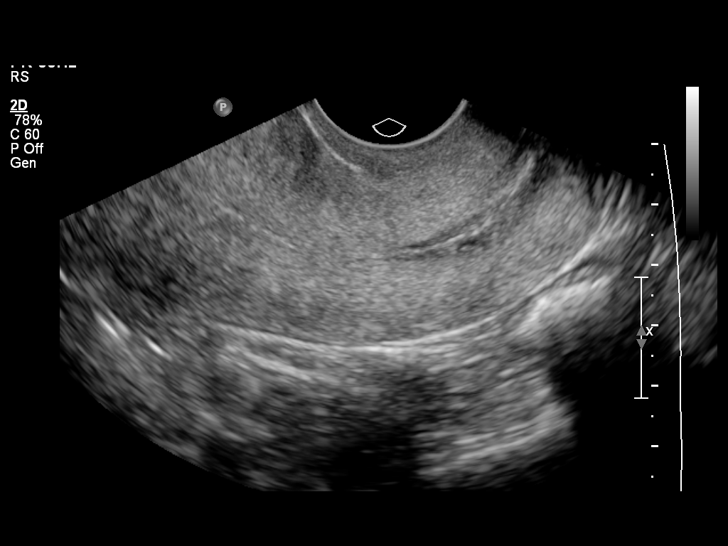
[im 34/63]
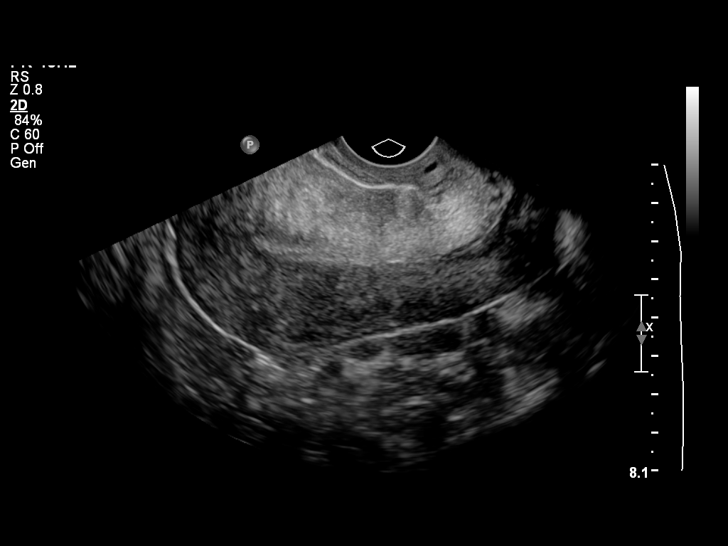
[im 39/63]
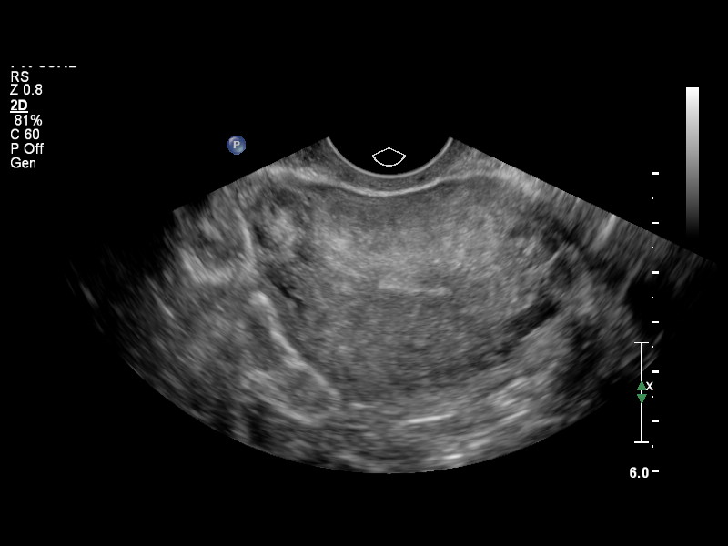
[im 42/63]
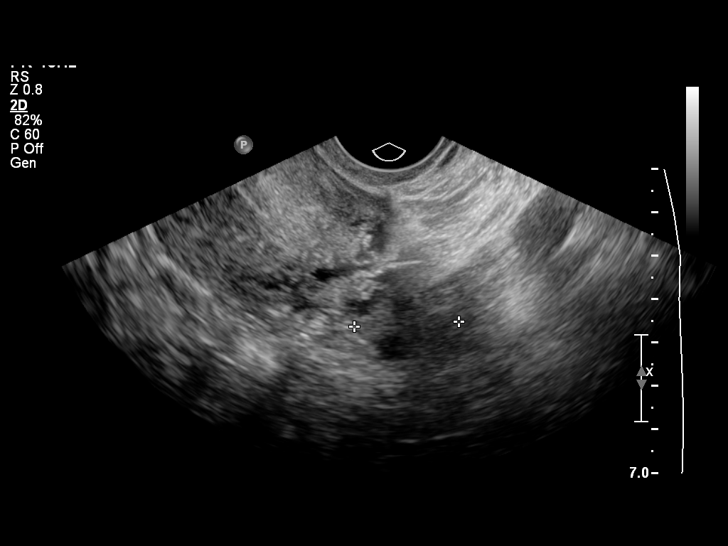
[im 47/63]
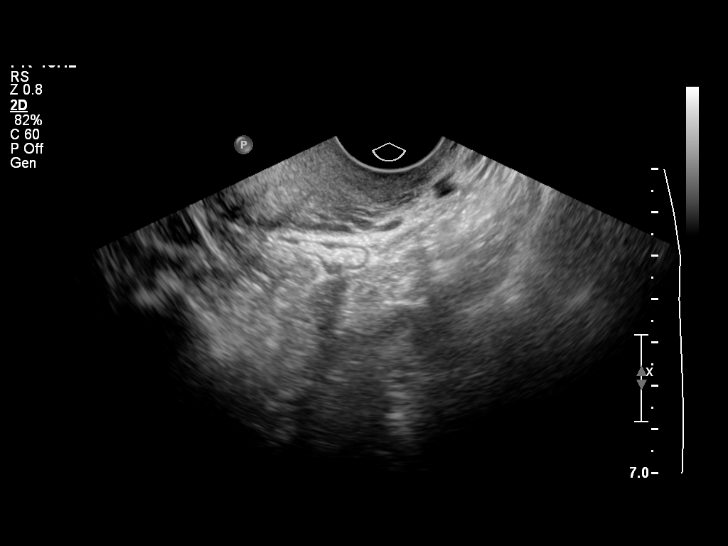
[im 52/63]
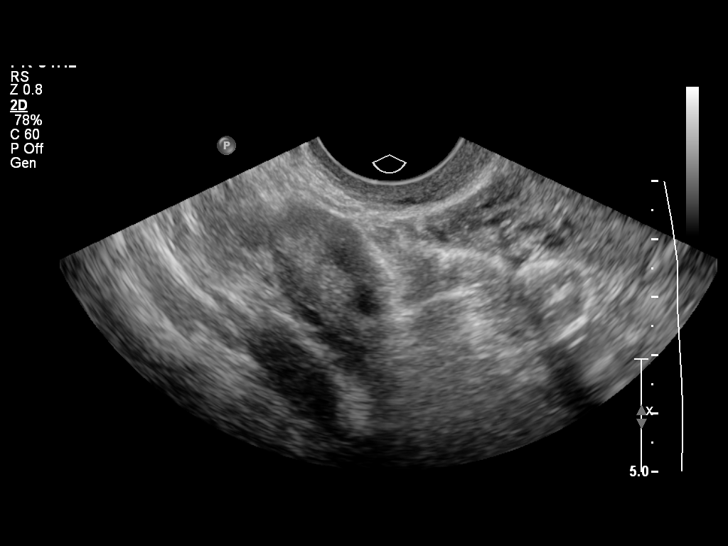
[im 57/63]
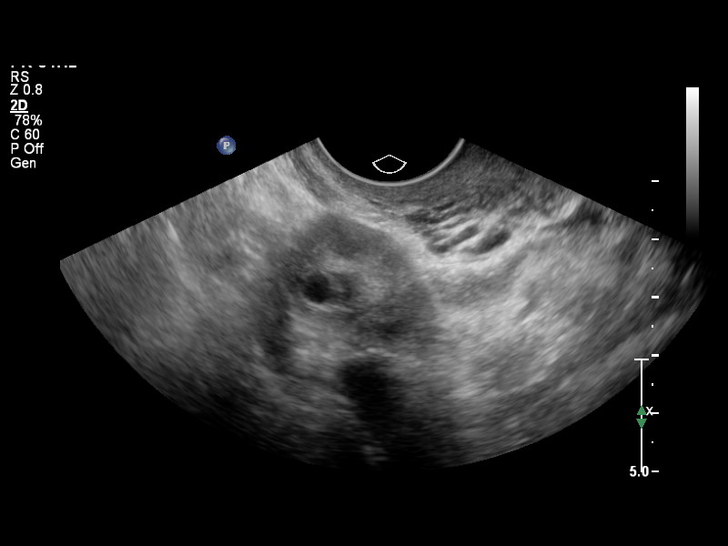
[im 63/63]
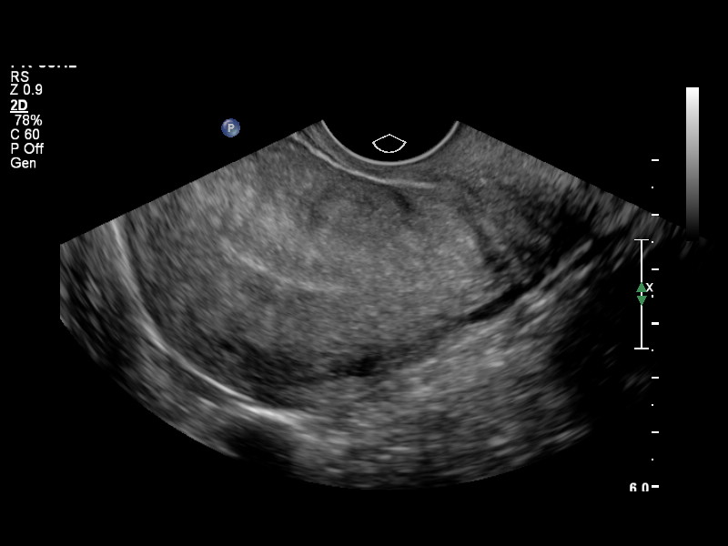

[14 of 25 positions shown; findings below may reference images not displayed]

It was necessary to proceed with endovaginal exam following the
transabdominal exam to visualize the endometrium.
FINDINGS: Uterus: Normal in size and appearance; measures 10.0 x 5.3 x
cm.  The patient's intrauterine device has been removed.

Endometrium: Normal in thickness and appearance; measures 0.4 cm in
thickness.

Right ovary:  Normal appearance/no adnexal mass; measures 3.4 x
x 1.7 cm.

Left ovary: Normal appearance/no adnexal mass; measures 3.9 x 1.6 x
2.0 cm.

Other findings: No free fluid seen within the pelvic cul-de-sac.
IMPRESSION: Normal study. No evidence of pelvic mass or other significant
abnormality.

## 2013-07-07 ENCOUNTER — Encounter: Payer: Self-pay | Admitting: Family Medicine

## 2013-07-07 ENCOUNTER — Ambulatory Visit (INDEPENDENT_AMBULATORY_CARE_PROVIDER_SITE_OTHER): Payer: Medicaid Other | Admitting: Family Medicine

## 2013-07-07 VITALS — BP 137/84 | HR 76 | Temp 97.7°F | Ht 64.0 in | Wt 197.0 lb

## 2013-07-07 DIAGNOSIS — M79671 Pain in right foot: Secondary | ICD-10-CM

## 2013-07-07 DIAGNOSIS — M79609 Pain in unspecified limb: Secondary | ICD-10-CM

## 2013-07-07 MED ORDER — MELOXICAM 15 MG PO TABS: 7.5000 mg | ORAL_TABLET | Freq: Every day | ORAL | Status: DC

## 2013-07-07 NOTE — Progress Notes (Signed)
Haley Mclaughlin is a 47 y.o. female who presents to [facility] today for R foot pain   Foot pain: started a couple of months ago. See by Dr. Salena Saner. And diagnosed w/ plantar fasciitis. Started using brace at night and insoles during the day. No improvement. Past 2-3 days has acutely worsened after came off porch "awkwardly".  Numbness in toes now. Performing exercises once a day. Ibuprofen 400mg  BID w/o benefit.  The following portions of the patient's history were reviewed and updated as appropriate: allergies, current medications, past medical history, family and social history, and problem list.  Past Medical History  Diagnosis Date  . Hyperlipidemia   . Gestational diabetes     diet controlled with #3  . Preterm labor     with last 4 preg, del at term  . Urinary tract infection   . Abnormal Pap smear   . PID (pelvic inflammatory disease)   . Hyperlipemia   . Sarcoidosis     ROS as above otherwise neg.    Medications reviewed. Current Outpatient Prescriptions  Medication Sig Dispense Refill  . aspirin EC 81 MG tablet Take 81 mg by mouth daily.      . Ca Carbonate-Mag Hydroxide (ROLAIDS PO) Take 1 tablet by mouth as needed (acid indigestion).      . famotidine (PEPCID) 20 MG tablet Take 2 tablets (40 mg total) by mouth at bedtime as needed for heartburn.  8 tablet  0  . ibuprofen (ADVIL,MOTRIN) 200 MG tablet Take 400 mg by mouth 2 (two) times daily.       Marland Kitchen lidocaine (XYLOCAINE) 2 % solution Take 15 mLs by mouth every 3 (three) hours as needed for pain.  100 mL  0  . omeprazole (PRILOSEC) 20 MG capsule Take 1 capsule (20 mg total) by mouth 2 (two) times daily.  60 capsule  2   No current facility-administered medications for this visit.    Exam:  BP 137/84  Pulse 76  Temp(Src) 97.7 F (36.5 C) (Oral)  Ht 5\' 4"  (1.626 m)  Wt 197 lb (89.359 kg)  BMI 33.80 kg/m2  LMP 07/01/2013 Gen: Well NAD HEENT: EOMI,  MMM Lungs: CTABL Nl WOB Heart: RRR no MRG Abd: NABS, NT, ND MSK:  intermittent pain on palpation of the plantar surface of the R foot primarily the around the lateral aspect of the heel and proximal midfoot. Sensation intact. FROM.   No results found for this or any previous visit (from the past 72 hour(s)).

## 2013-07-07 NOTE — Patient Instructions (Signed)
You foot pain is what we call musculoskeletal This should resolve on it's own Please start using meloxicam every day for 2 weeks so long as your heartburn does not significantly worsen Please wear tennis shoes with more support Please ice your foot 2 times a day Please come back in another 4 weeks if this does not improve

## 2013-07-07 NOTE — Assessment & Plan Note (Signed)
Unlikely plantar fasciitis Etiology unclear. General MSK type pain. MSK exam inconsistent No concern for bony abnormality Likely to resolve w/ time Cont brace, Ice, Stretching,  Start Meloxicam (pt aware of increase GERD risk) eval in 4 wks if not better

## 2013-07-10 ENCOUNTER — Ambulatory Visit: Payer: Medicaid Other | Admitting: Family Medicine

## 2013-07-11 ENCOUNTER — Telehealth: Payer: Self-pay | Admitting: Family Medicine

## 2013-07-11 NOTE — Telephone Encounter (Signed)
Had regular period.  Son jumped on stomach and she felt a gush. When she went to bathroom there was blood gushing.  There is still some bleeding. Now she has developed cramps. She wants to know if she should be seen? Please advise

## 2013-07-12 ENCOUNTER — Encounter (HOSPITAL_COMMUNITY): Payer: Self-pay | Admitting: General Practice

## 2013-07-12 ENCOUNTER — Inpatient Hospital Stay (HOSPITAL_COMMUNITY)
Admission: AD | Admit: 2013-07-12 | Discharge: 2013-07-12 | Disposition: A | Discharge status: Home / Self Care | Payer: Medicaid Other | Source: Ambulatory Visit | Attending: Obstetrics and Gynecology | Admitting: Obstetrics and Gynecology

## 2013-07-12 ENCOUNTER — Inpatient Hospital Stay (HOSPITAL_COMMUNITY): Payer: Medicaid Other

## 2013-07-12 DIAGNOSIS — R109 Unspecified abdominal pain: Secondary | ICD-10-CM | POA: Insufficient documentation

## 2013-07-12 DIAGNOSIS — N921 Excessive and frequent menstruation with irregular cycle: Secondary | ICD-10-CM

## 2013-07-12 DIAGNOSIS — N920 Excessive and frequent menstruation with regular cycle: Secondary | ICD-10-CM | POA: Insufficient documentation

## 2013-07-12 LAB — WET PREP, GENITAL
Trich, Wet Prep: NONE SEEN
Yeast Wet Prep HPF POC: NONE SEEN

## 2013-07-12 LAB — URINALYSIS, ROUTINE W REFLEX MICROSCOPIC
Leukocytes, UA: NEGATIVE
Nitrite: NEGATIVE
Specific Gravity, Urine: >1.03 — ABNORMAL HIGH (ref 1.005–1.030)
pH: 6 (ref 5.0–8.0)

## 2013-07-12 LAB — URINE MICROSCOPIC-ADD ON

## 2013-07-12 LAB — CBC
HCT: 36 % (ref 36.0–46.0)
MCH: 29.5 pg (ref 26.0–34.0)
Platelets: 216 10*3/uL (ref 150–400)
RBC: 4.14 MIL/uL (ref 3.87–5.11)
WBC: 8.4 10*3/uL (ref 4.0–10.5)

## 2013-07-12 LAB — POCT PREGNANCY, URINE: Preg Test, Ur: NEGATIVE

## 2013-07-12 LAB — TYPE AND SCREEN: Antibody Screen: NEGATIVE

## 2013-07-12 MED ORDER — IBUPROFEN 600 MG PO TABS: 600.0000 mg | ORAL_TABLET | Freq: Four times a day (QID) | ORAL | Status: DC | PRN

## 2013-07-12 NOTE — Telephone Encounter (Signed)
If has continued pain or continued heavy bleeding she should be seen If pain is gone and bleeding slowing then can observe at home Thanks [facility]

## 2013-07-12 NOTE — MAU Note (Signed)
Pt presents to MAU with c/o vaginal bleeding since Friday. Pt states she has been passing clots with the worse day being Friday. Pt states she has no period since August but they were regular prior to that. Pt states it's possible that she could be pregnant but she didn't really think about it. Pt states pain in lower back and abdomen.

## 2013-07-12 NOTE — MAU Provider Note (Signed)
Chief Complaint: Vaginal Bleeding and Abdominal Pain  First Provider Initiated Contact with Patient 07/12/13 2030      SUBJECTIVE HPI: Haley Mclaughlin is a 47 y.o. [id number] female who presents with heavy vaginal bleeding and passing clots starting 5 days ago and low abdominal cramping, left greater than right. Bleeding started after roughhousing with her 83-year-old. Patient is concerned that it could have been related to that. Bleeding decreased on day 3 and day 4,  but increased slightly today. Normally has 28 day cycles, moderate flow lasting 3-5 days, but missed her periods in September and October. Did not hit pregnancy test, but is not suspicious for pregnancy. Rates abdominal pain 4-5/10 on pain scale. Has not taken anything for the pain. There are no aggravating or alleviating factors.  Past Medical History  Diagnosis Date  . Hyperlipidemia   . Gestational diabetes     diet controlled with #3  . Preterm labor     with last 4 preg, del at term  . Urinary tract infection   . Abnormal Pap smear   . PID (pelvic inflammatory disease)   . Hyperlipemia   . Sarcoidosis    OB History  Gravida Para Term Preterm AB SAB TAB Ectopic Multiple Living  8 6 6  0 2 1 1  0 0 6    # Outcome Date GA Lbr Len/2nd Weight Sex Delivery Anes PTL Lv  8 TAB           7 TRM           6 TRM           5 TRM           4 TRM           3 TRM           2 TRM           1 SAB              Past Surgical History  Procedure Laterality Date  . Appendectomy    . Induced abortion     History   Social History  . Marital Status: Married    Spouse Name: N/A    Number of Children: N/A  . Years of Education: N/A   Occupational History  . Not on file.   Social History Main Topics  . Smoking status: Never Smoker   . Smokeless tobacco: Never Used  . Alcohol Use: No  . Drug Use: No  . Sexual Activity: Yes    Birth Control/ Protection: Condom     Comment: IUD removed 06/17/2011   Other Topics Concern  .  Not on file   Social History Narrative  . No narrative on file   No current facility-administered medications on file prior to encounter.   Current Outpatient Prescriptions on File Prior to Encounter  Medication Sig Dispense Refill  . aspirin EC 81 MG tablet Take 81 mg by mouth daily.      . famotidine (PEPCID) 20 MG tablet Take 2 tablets (40 mg total) by mouth at bedtime as needed for heartburn.  8 tablet  0  . meloxicam (MOBIC) 15 MG tablet Take 0.5-1 tablets (7.5-15 mg total) by mouth daily.  30 tablet  0   Allergies  Allergen Reactions  . Sulfamethoxazole-Trimethoprim Rash and Other (See Comments)    "Feels shaky"    ROS: Positive for vaginal bleeding, low abdominal cramping and mild nausea. Negative for fever, chills, dizziness, syncope, urinary complaints,  vomiting, diarrhea, constipation, dyspareunia or any other history of heavy bleeding.  OBJECTIVE Blood pressure 129/82, pulse 82, temperature 98 F (36.7 C), temperature source Oral, resp. rate 18, height 5\' 4"  (1.626 m), weight 90.901 kg (200 lb 6.4 oz), last menstrual period 04/24/2013. GENERAL: Well-developed, well-nourished, obese female in no acute distress. Normal color for race. HEENT: Normocephalic HEART: normal rate RESP: normal effort ABDOMEN: Soft, non-tender. Positive bowel sounds. Negative for mass, guarding or rebound. No CVA tenderness. EXTREMITIES: Nontender, no edema NEURO: Alert and oriented SPECULUM EXAM: NEFG, small amount of dark red blood mixed with mucus in vault, cervix clean, nonfriable. No polyps.  BIMANUAL: cervix closed; uterus normal size, no adnexal tenderness or masses. No cervical motion tenderness.  LAB RESULTS Results for orders placed during the hospital encounter of 07/12/13 (from the past 24 hour(s))  URINALYSIS, ROUTINE W REFLEX MICROSCOPIC     Status: Abnormal   Collection Time    07/12/13  7:52 PM      Result Value Range   Color, Urine YELLOW  YELLOW   APPearance CLEAR  CLEAR    Specific Gravity, Urine >1.030 (*) 1.005 - 1.030   pH 6.0  5.0 - 8.0   Glucose, UA NEGATIVE  NEGATIVE mg/dL   Hgb urine dipstick LARGE (*) NEGATIVE   Bilirubin Urine NEGATIVE  NEGATIVE   Ketones, ur NEGATIVE  NEGATIVE mg/dL   Protein, ur NEGATIVE  NEGATIVE mg/dL   Urobilinogen, UA 0.2  0.0 - 1.0 mg/dL   Nitrite NEGATIVE  NEGATIVE   Leukocytes, UA NEGATIVE  NEGATIVE  URINE MICROSCOPIC-ADD ON     Status: Abnormal   Collection Time    07/12/13  7:52 PM      Result Value Range   Squamous Epithelial / LPF FEW (*) RARE   WBC, UA 0-2  <3 WBC/hpf   RBC / HPF 11-20  <3 RBC/hpf   Bacteria, UA FEW (*) RARE  POCT PREGNANCY, URINE     Status: None   Collection Time    07/12/13  8:01 PM      Result Value Range   Preg Test, Ur NEGATIVE  NEGATIVE  CBC     Status: None   Collection Time    07/12/13  8:29 PM      Result Value Range   WBC 8.4  4.0 - 10.5 K/uL   RBC 4.14  3.87 - 5.11 MIL/uL   Hemoglobin 12.2  12.0 - 15.0 g/dL   HCT 29.5  62.1 - 30.8 %   MCV 87.0  78.0 - 100.0 fL   MCH 29.5  26.0 - 34.0 pg   MCHC 33.9  30.0 - 36.0 g/dL   RDW 65.7  84.6 - 96.2 %   Platelets 216  150 - 400 K/uL  TYPE AND SCREEN     Status: None   Collection Time    07/12/13  8:29 PM      Result Value Range   ABO/RH(D) A POS     Antibody Screen NEG     Sample Expiration 07/15/2013    WET PREP, GENITAL     Status: Abnormal   Collection Time    07/12/13  8:53 PM      Result Value Range   Yeast Wet Prep HPF POC NONE SEEN  NONE SEEN   Trich, Wet Prep NONE SEEN  NONE SEEN   Clue Cells Wet Prep HPF POC MODERATE (*) NONE SEEN   WBC, Wet Prep HPF POC FEW (*) NONE SEEN  IMAGING US Transvaginal Non-ob  07/12/2013   CLINICAL DATA:  Vaginal bleeding and lower abdominal pain following and abdominal injury. Menometrorrhagia.  EXAM: TRANSABDOMINAL AND TRANSVAGINAL ULTRASOUND OF PELVIS  TECHNIQUE: Both transabdominal and transvaginal ultrasound examinations of the pelvis were performed. Transabdominal technique was  performed for global imaging of the pelvis including uterus, ovaries, adnexal regions, and pelvic cul-de-sac. It was necessary to proceed with endovaginal exam following the transabdominal exam to visualize the uterus and ovaries in better detail.  COMPARISON:  05/22/2012.  FINDINGS: Uterus  Measurements: 9.0 x 6.7 x 5.5 cm. No fibroids or other mass visualized.  Endometrium  Thickness: 7.9 mm.  No focal abnormality visualized.  Right ovary  Measurements: 3.9 x 2.8 x 2.4 cm. Normal appearance/no adnexal mass.  Left ovary  Measurements: 3.0 x 2.2 x 2.1 cm. Normal appearance/no adnexal mass.  Other findings  No free fluid.  IMPRESSION: Normal examination.   Electronically Signed   By: Gordan Payment M.D.   On: 07/12/2013 22:12   US Pelvis Complete  07/12/2013   CLINICAL DATA:  Vaginal bleeding and lower abdominal pain following and abdominal injury. Menometrorrhagia.  EXAM: TRANSABDOMINAL AND TRANSVAGINAL ULTRASOUND OF PELVIS  TECHNIQUE: Both transabdominal and transvaginal ultrasound examinations of the pelvis were performed. Transabdominal technique was performed for global imaging of the pelvis including uterus, ovaries, adnexal regions, and pelvic cul-de-sac. It was necessary to proceed with endovaginal exam following the transabdominal exam to visualize the uterus and ovaries in better detail.  COMPARISON:  05/22/2012.  FINDINGS: Uterus  Measurements: 9.0 x 6.7 x 5.5 cm. No fibroids or other mass visualized.  Endometrium  Thickness: 7.9 mm.  No focal abnormality visualized.  Right ovary  Measurements: 3.9 x 2.8 x 2.4 cm. Normal appearance/no adnexal mass.  Left ovary  Measurements: 3.0 x 2.2 x 2.1 cm. Normal appearance/no adnexal mass.  Other findings  No free fluid.  IMPRESSION: Normal examination.   Electronically Signed   By: Gordan Payment M.D.   On: 07/12/2013 22:12   MAU COURSE  ASSESSMENT 1. Menometrorrhagia     PLAN Discharge home in stable condition. Comfort measures. Bleeding precautions.      Follow-up Information   Follow up with [facility]. (for ongoing problems with heavy or irregular periods. You may need an endometrial biopsy.)    Specialty:  Obstetrics and Gynecology   Contact information:   [address] [place] [address] [phone]      Follow up with [facility] MATERNITY ADMISSIONS. (As needed for gynecologic emergencies)    Contact information:   [address] [id number] [place] [address] [phone]       Medication List         aspirin EC 81 MG tablet  Take 81 mg by mouth daily.     famotidine 20 MG tablet  Commonly known as:  PEPCID  Take 2 tablets (40 mg total) by mouth at bedtime as needed for heartburn.     ibuprofen 600 MG tablet  Commonly known as:  ADVIL,MOTRIN  Take 1 tablet (600 mg total) by mouth every 6 (six) hours as needed for moderate pain or cramping.     meloxicam 15 MG tablet  Commonly known as:  MOBIC  Take 0.5-1 tablets (7.5-15 mg total) by mouth daily.       Kell, CNM 07/12/2013  10:56 PM

## 2013-07-12 NOTE — MAU Note (Signed)
Friday night while playing with 77yr old son, pts son landed on her abdomen. Pt immediately felt pain and started having vaginal bleeding with clots. Pain and cramping continued thru the night. Bleeding lightened to spotting. Continues to have bleeding and cramping. LMP 04/24/2013.

## 2013-07-16 NOTE — MAU Provider Note (Signed)
Attestation of Attending Supervision of Advanced Practitioner: Evaluation and management procedures were performed by the PA/NP/CNM/OB Fellow under my supervision/collaboration. Chart reviewed and agree with management and plan.  Jonty Morrical V 07/16/2013 8:06 PM

## 2013-08-01 NOTE — Telephone Encounter (Signed)
Patient seen at [facility] on 11/12 for menometrorrhagia.

## 2013-08-05 ENCOUNTER — Emergency Department: Payer: Self-pay | Admitting: Emergency Medicine

## 2013-09-13 ENCOUNTER — Ambulatory Visit: Payer: Medicaid Other | Admitting: Family Medicine

## 2013-09-14 ENCOUNTER — Ambulatory Visit
Admission: RE | Admit: 2013-09-14 | Discharge: 2013-09-14 | Disposition: A | Discharge status: Home / Self Care | Payer: Medicaid Other | Source: Ambulatory Visit | Attending: Family Medicine | Admitting: Family Medicine

## 2013-09-14 ENCOUNTER — Encounter: Payer: Self-pay | Admitting: Family Medicine

## 2013-09-14 ENCOUNTER — Ambulatory Visit (INDEPENDENT_AMBULATORY_CARE_PROVIDER_SITE_OTHER): Payer: Medicaid Other | Admitting: Family Medicine

## 2013-09-14 VITALS — BP 157/87 | HR 93 | Temp 97.7°F | Ht 64.0 in | Wt 204.0 lb

## 2013-09-14 DIAGNOSIS — M79671 Pain in right foot: Secondary | ICD-10-CM

## 2013-09-14 DIAGNOSIS — M79609 Pain in unspecified limb: Secondary | ICD-10-CM

## 2013-09-14 MED ORDER — GABAPENTIN 100 MG PO CAPS: 100.0000 mg | ORAL_CAPSULE | Freq: Three times a day (TID) | ORAL | Status: DC

## 2013-09-14 NOTE — Assessment & Plan Note (Signed)
Etiology unclear.  Present for several months at this point.  No benefit w/ NSAID, ICE, brace adn exercises for plantar fascitis Start Gabapentin and will obtain Xrays F/u in 4 wks if not better Continue NSAIDs, Ice, brace, exercises and changing to more comfortable footwear.

## 2013-09-14 NOTE — Patient Instructions (Signed)
Please go get your xrays done We will call you if there are any major abnormalities noted Please start the Gabapentin for nerve pain Please continue all of your other exercises, bracing, ice, and ibuprofen or meloxicam Please try and purchase a softer sole for  Your shoes Follow up in 4 weeks with Dr. Deirdre Priesthambliss or sooner if needed.

## 2013-09-14 NOTE — Progress Notes (Signed)
Haley Mclaughlin is a 48 y.o. female who [facility] for SD appt for R foot pain  R foot pain: pain present for several months. Previous evaluation by myself and Dr. Deirdre Priesthambliss. Reviewed old records. No improvement w/ the meloxicam daily, stretches, brace and ice. Pain is sharp. Feels like ripping w/ any movement. Previously pain was located to the heal but now w/ radiation laterally and posteriorly. First step of the morning is not extremely painful but pain is present and worsens over the course of the day. Change in footwear to include exercise shoes w/o benefit. No h/o trauma.   The following portions of the patient's history were reviewed and updated as appropriate: allergies, current medications, past medical history, family and social history, and problem list.  Past Medical History  Diagnosis Date  . Hyperlipidemia   . Gestational diabetes     diet controlled with #3  . Preterm labor     with last 4 preg, del at term  . Urinary tract infection   . Abnormal Pap smear   . PID (pelvic inflammatory disease)   . Hyperlipemia   . Sarcoidosis     ROS as above otherwise neg.    Medications reviewed. Current Outpatient Prescriptions  Medication Sig Dispense Refill  . aspirin EC 81 MG tablet Take 81 mg by mouth daily.      . famotidine (PEPCID) 20 MG tablet Take 2 tablets (40 mg total) by mouth at bedtime as needed for heartburn.  8 tablet  0  . gabapentin (NEURONTIN) 100 MG capsule Take 1 capsule (100 mg total) by mouth 3 (three) times daily.  90 capsule  3  . ibuprofen (ADVIL,MOTRIN) 600 MG tablet Take 1 tablet (600 mg total) by mouth every 6 (six) hours as needed for moderate pain or cramping.  30 tablet  1  . meloxicam (MOBIC) 15 MG tablet Take 0.5-1 tablets (7.5-15 mg total) by mouth daily.  30 tablet  0   No current facility-administered medications for this visit.    Exam:  BP 157/87  Pulse 93  Temp(Src) 97.7 F (36.5 C) (Oral)  Ht 5\' 4"  (1.626 m)  Wt 204 lb  (92.534 kg)  BMI 35.00 kg/m2 Gen: Well NAD HEENT: EOMI,  MMM MSK: ankles FROM, significant ttp of soft tissue of R heel and lateral soft tissue. No edema or induration. No bony abnormality. Plantar/dorsal/inversion/eversion of foot w/o pain.  Leg legnth equal  No results found for this or any previous visit (from the past 72 hour(s)).  A/P (as seen in Problem list)  Pain in right foot Etiology unclear.  Present for several months at this point.  No benefit w/ NSAID, ICE, brace adn exercises for plantar fascitis Start Gabapentin and will obtain Xrays F/u in 4 wks if not better Continue NSAIDs, Ice, brace, exercises and changing to more comfortable footwear.

## 2013-11-17 ENCOUNTER — Ambulatory Visit: Payer: Medicaid Other | Admitting: Family Medicine

## 2013-11-20 ENCOUNTER — Ambulatory Visit: Payer: Medicaid Other | Admitting: Family Medicine

## 2013-11-22 ENCOUNTER — Ambulatory Visit (INDEPENDENT_AMBULATORY_CARE_PROVIDER_SITE_OTHER): Payer: Medicaid Other | Admitting: Family Medicine

## 2013-11-22 ENCOUNTER — Encounter: Payer: Self-pay | Admitting: Family Medicine

## 2013-11-22 VITALS — BP 139/83 | HR 80 | Temp 98.7°F | Wt 206.0 lb

## 2013-11-22 DIAGNOSIS — M79609 Pain in unspecified limb: Secondary | ICD-10-CM

## 2013-11-22 DIAGNOSIS — M79671 Pain in right foot: Secondary | ICD-10-CM

## 2013-11-22 MED ORDER — GABAPENTIN 300 MG PO CAPS: 300.0000 mg | ORAL_CAPSULE | Freq: Three times a day (TID) | ORAL | Status: DC

## 2013-11-22 NOTE — Assessment & Plan Note (Signed)
Likely plantar fasciitis. Conservative measures w/o much benefit. Explained possible prolonged course of this condition Continue ICE, NSAIDs, stretches. Encouraged to change footwear again as not wearing flats w/ inserts today WIll increase gabapentin Steroid injection as possible option though may not likely to help [facility]

## 2013-11-22 NOTE — Patient Instructions (Addendum)
Please continue your daily stretching, m otrin, and ice.  Please only wear soft supportive sneakers Please take the increased dose of gabapentin This may take several weeks to months to go away Consider a steroid injection Please go to sports medicine for further evaluation

## 2013-11-22 NOTE — Progress Notes (Signed)
Haley Mclaughlin is a 48 y.o. female [facility]   R foot pain: continues to worsen since last appt. Painful first step in the morning. Ice and motrin (no change w/ meloxicam) w/ some benefit. Now changing gate and w/ some distal leg stiffness. Denies any change in sensation of function of foot/toes. Daily stretches worsen pain. Tried orthotics from the store but did not help. No change w/ neurontin. Symptoms present for at least 3 months at this point.   PREVIOUS NOTE HPI: R foot pain: pain present for several months. Previous evaluation by myself and Dr. Deirdre Priesthambliss. Reviewed old records. No improvement w/ the meloxicam daily, stretches, brace and ice. Pain is sharp. Feels like ripping w/ any movement. Previously pain was located to the heal but now w/ radiation laterally and posteriorly. First step of the morning is not extremely painful but pain is present and worsens over the course of the day. Change in footwear to include exercise shoes w/o benefit. No h/o trauma.     The following portions of the patient's history were reviewed and updated as appropriate: allergies, current medications, past medical history, family and social history, and problem list.  Patient is a nonsmoker.  Past Medical History  Diagnosis Date  . Hyperlipidemia   . Gestational diabetes     diet controlled with #3  . Preterm labor     with last 4 preg, del at term  . Urinary tract infection   . Abnormal Pap smear   . PID (pelvic inflammatory disease)   . Hyperlipemia   . Sarcoidosis     ROS as above otherwise neg.    Medications reviewed. Current Outpatient Prescriptions  Medication Sig Dispense Refill  . aspirin EC 81 MG tablet Take 81 mg by mouth daily.      . famotidine (PEPCID) 20 MG tablet Take 2 tablets (40 mg total) by mouth at bedtime as needed for heartburn.  8 tablet  0  . gabapentin (NEURONTIN) 100 MG capsule Take 1 capsule (100 mg total) by mouth 3 (three) times daily.   90 capsule  3  . ibuprofen (ADVIL,MOTRIN) 600 MG tablet Take 1 tablet (600 mg total) by mouth every 6 (six) hours as needed for moderate pain or cramping.  30 tablet  1  . meloxicam (MOBIC) 15 MG tablet Take 0.5-1 tablets (7.5-15 mg total) by mouth daily.  30 tablet  0   No current facility-administered medications for this visit.    Exam: BP 139/83  Pulse 80  Temp(Src) 98.7 F (37.1 C) (Oral)  Wt 206 lb (93.441 kg)  LMP 10/15/2013 Gen: Well NAD HEENT: EOMI,  MMM MSK: heel tender to palpation. No effusion or joint laxity. No edema.   No results found for this or any previous visit (from the past 72 hour(s)).  A/P (as seen in Problem list)  No problem-specific assessment & plan notes found for this encounter.

## 2013-11-29 ENCOUNTER — Encounter: Payer: Self-pay | Admitting: Sports Medicine

## 2013-11-29 ENCOUNTER — Ambulatory Visit (INDEPENDENT_AMBULATORY_CARE_PROVIDER_SITE_OTHER): Payer: Medicaid Other | Admitting: Sports Medicine

## 2013-11-29 VITALS — BP 131/93 | Ht 64.0 in | Wt 198.0 lb

## 2013-11-29 DIAGNOSIS — M722 Plantar fascial fibromatosis: Secondary | ICD-10-CM

## 2013-11-29 DIAGNOSIS — M79609 Pain in unspecified limb: Secondary | ICD-10-CM

## 2013-11-29 DIAGNOSIS — M79671 Pain in right foot: Secondary | ICD-10-CM

## 2013-11-29 MED ORDER — MELOXICAM 15 MG PO TABS
ORAL_TABLET | ORAL | Status: DC
Start: 2013-11-29 — End: 2014-02-26

## 2013-11-29 MED ORDER — MELOXICAM 15 MG PO TABS: ORAL_TABLET | ORAL | Status: DC

## 2013-11-29 NOTE — Progress Notes (Signed)
   Subjective:    Patient ID: Haley Mclaughlin, female    DOB: 11/05/1965, [age] y.o.   MRN: [id number]  HPI chief complaint: Right heel pain  48 year old female comes in today complaining of 3 months of right heel pain. No trauma. Gradual onset of pain that she localizes to the plantar aspect of the right heel. It is worse with first getting up in the morning and at the end of the day. She describes it as a sharp knife type of pain in the plantar aspect of her heel with standing. Some radiating pain down into her foot as well. She was diagnosed with plantar fasciitis and given some stretching as well as some icing but despite that her pain persists. She is also tried ibuprofen without any pain relief. No numbness or tingling. No pain in the other foot.  Past medical history reviewed Medications reviewed Allergies reviewed    Review of Systems As above    Objective:   Physical Exam Well-developed, well-nourished. No acute distress. Awake alert and oriented x3. Vital signs reviewed.  Right heel: Tenderness to palpation at the calcaneal insertion of the plantar fascia. Negative calcaneal squeeze. No soft tissue swelling. Negative Tinel's over the tarsal tunnel. Pes planus with standing. Neurovascularly intact distally.       Assessment & Plan:  Right heel pain secondary to plantar fasciitis  I had a long talk with the patient regarding her diagnosis and treatment options. She is performing plantar fascial stretches appropriately but I'm going to have her start utilizing an ice bath twice daily for her heel instead of a simple ice pack. I've also fitted her with a pair of green sports insoles and scaphoid pads and I have padded the heels with blue foam to allow for a little more cushion. We will try her on meloxicam 15 mg daily for 7 days and then when necessary. I cautioned her about GI upset she will discontinue the medicine immediately if she experiences this. Followup with me in 4 weeks. I  did discuss the possibility of a cortisone injection if symptoms linger but also explained that there is no guarantee that the injection will result in prolonged pain relief.

## 2014-01-22 ENCOUNTER — Emergency Department: Payer: Self-pay | Admitting: Emergency Medicine

## 2014-01-29 IMAGING — US US OB TRANSVAGINAL
1 series · 14 of 28 positions shown · non-contrast
Comparison: 01/11/2012

CLINICAL DATA: Fall, abdominal pain.

TRANSVAGINAL OB ULTRASOUND
TECHNIQUE: Transvaginal ultrasound was performed for evaluation of
the gestation as well as the maternal uterus and adnexal regions.

[Series 1: us ob transvaginal · 32 acquisitions, 14 frames shown]
[im 2/32]
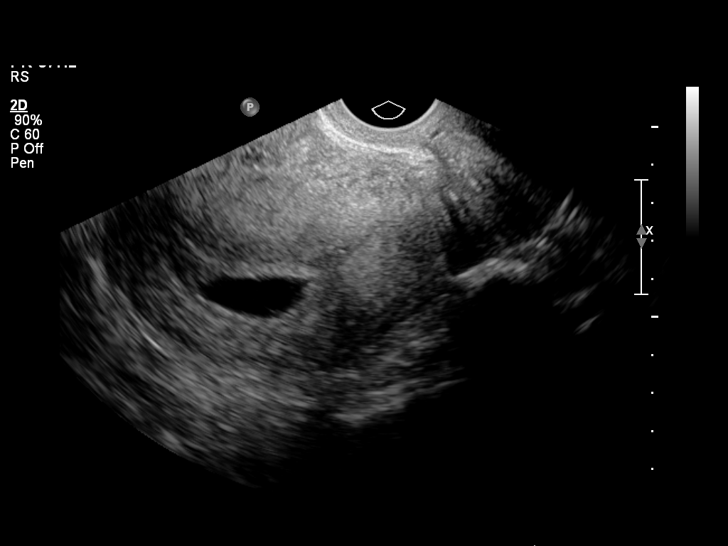
[im 4/32]
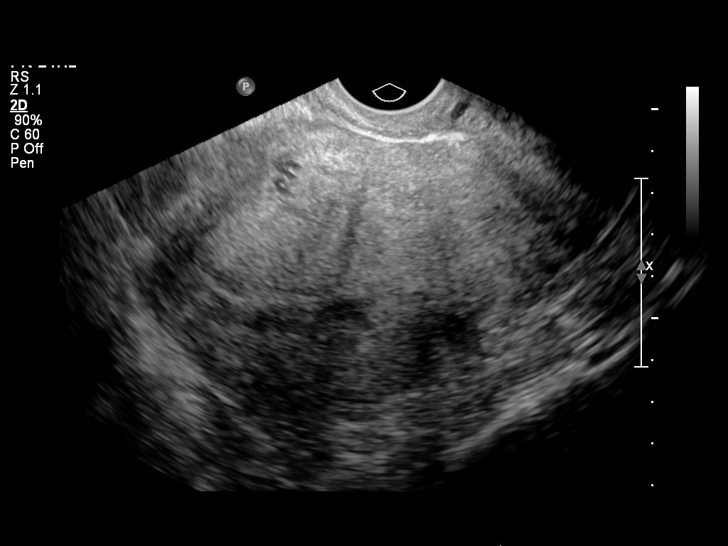
[im 6/32]
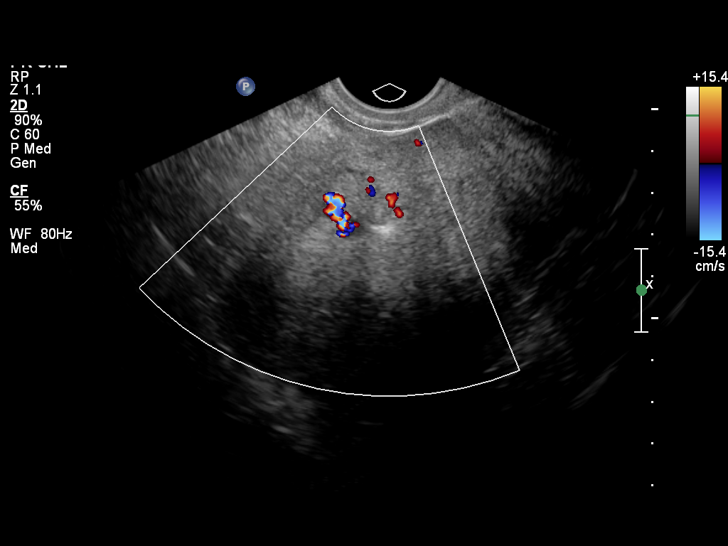
[im 9/32]
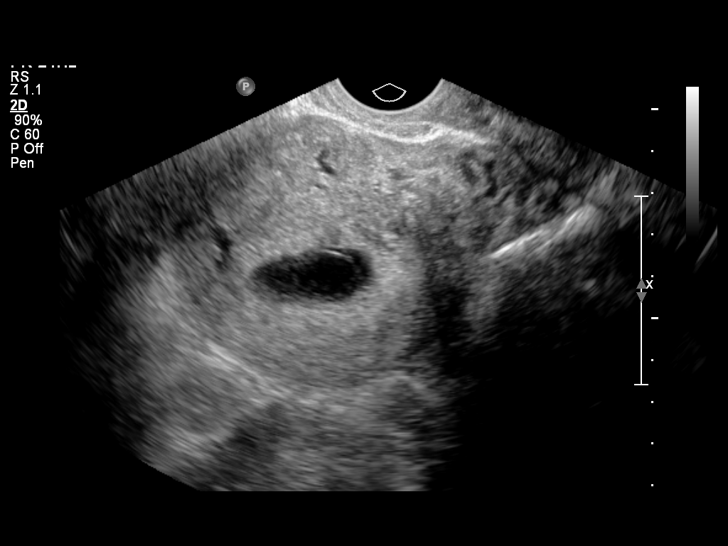
[im 11/32]
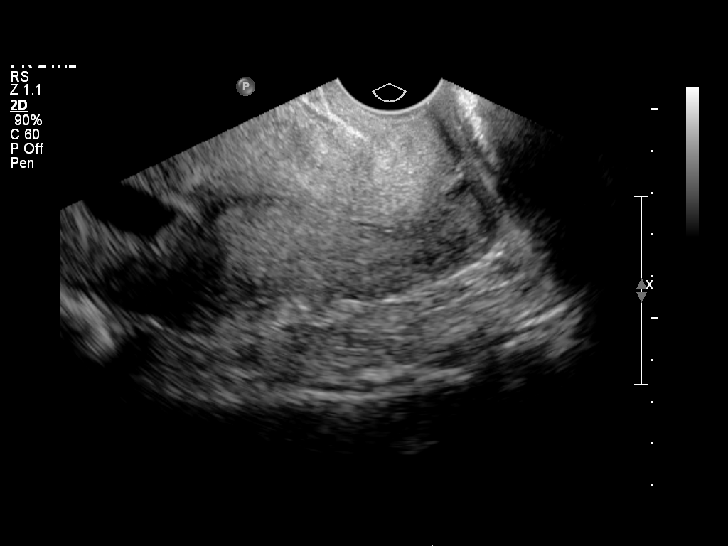
[im 13/32]
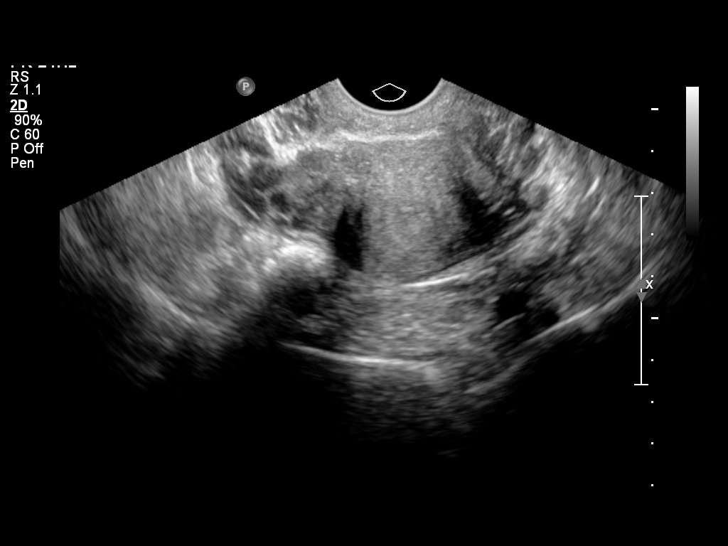
[im 15/32]
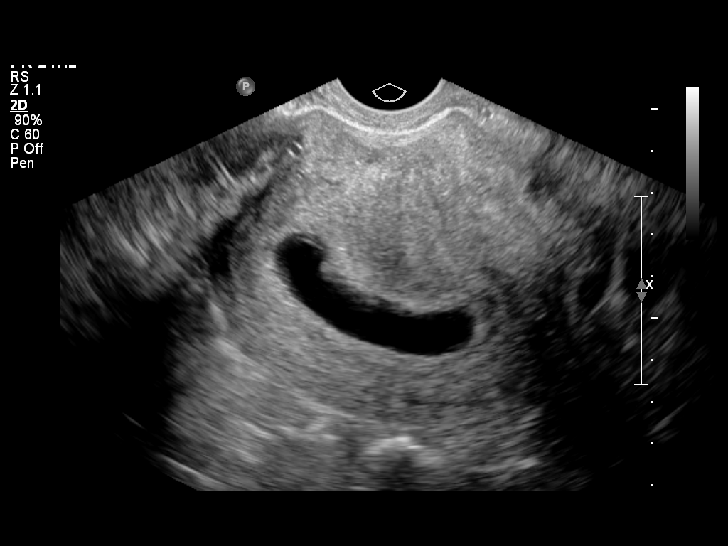
[im 18/32]
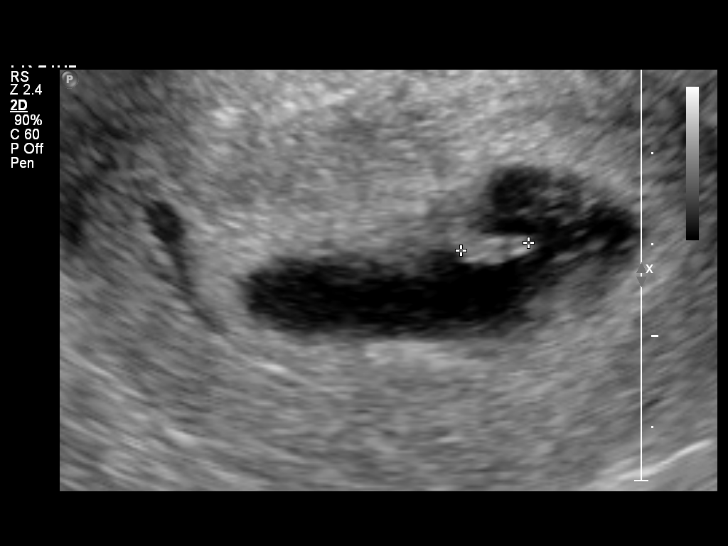
[im 20/32]
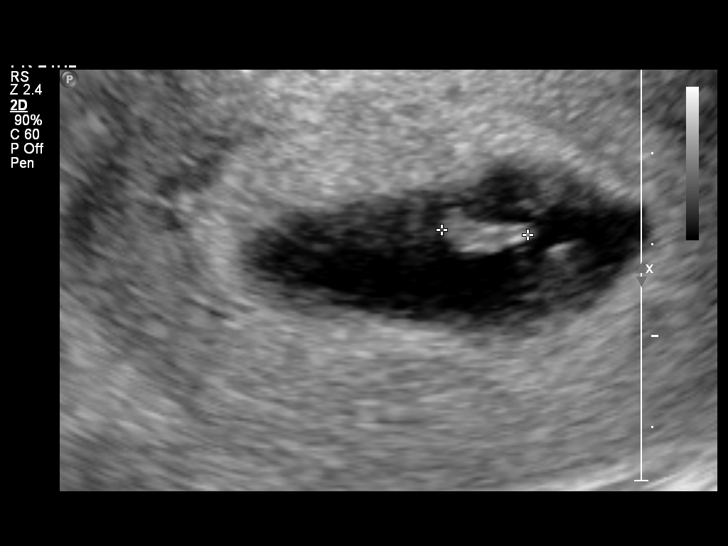
[im 22/32]
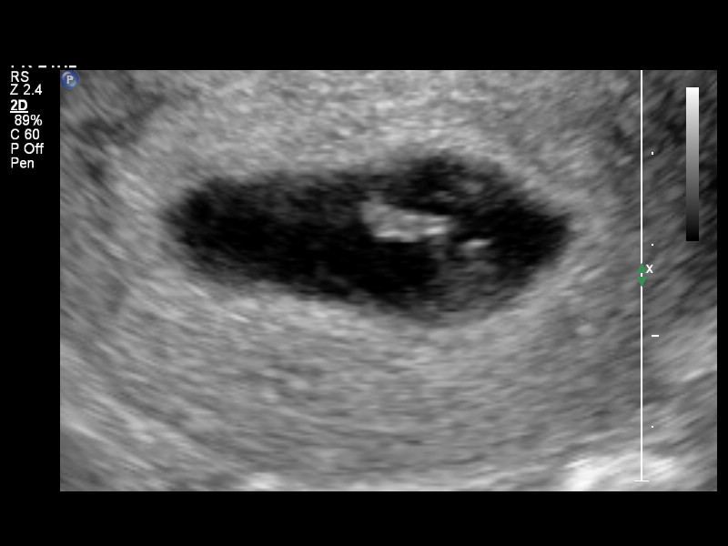
[im 25/32]
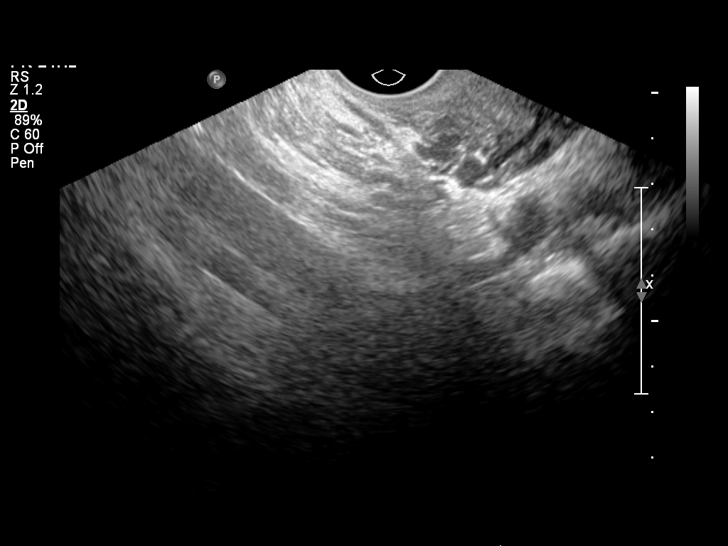
[im 27/32]
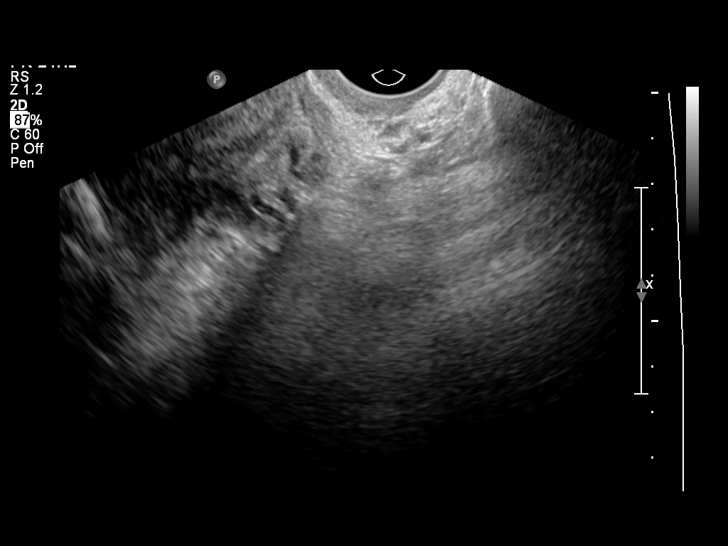
[im 29/32]
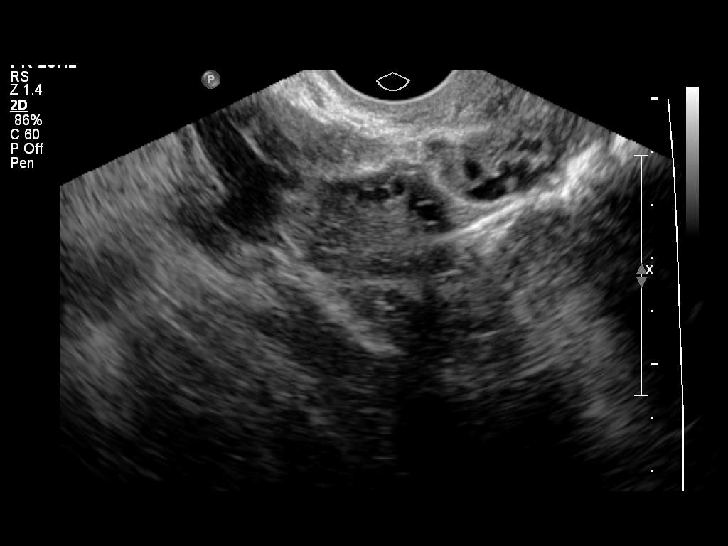
[im 32/32]
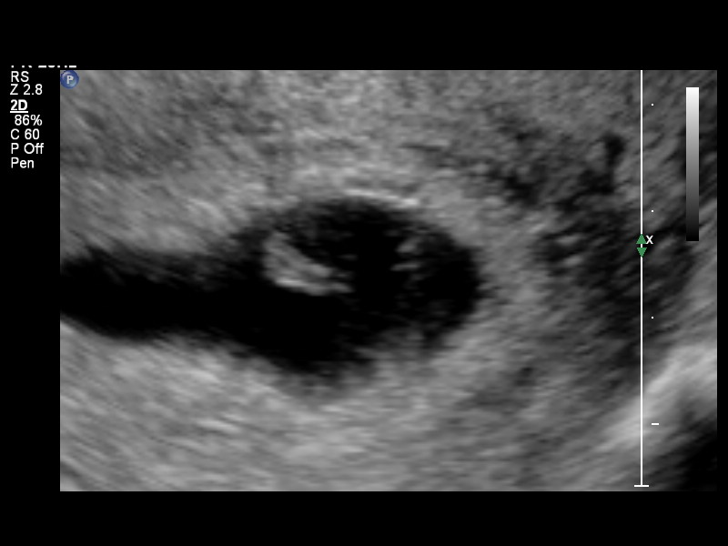

[14 of 28 positions shown; findings below may reference images not displayed]

FINDINGS: Single intrauterine gestational sac with embryo.  No
cardiac activity documented.  Crown-rump length of 8.5 mm equals 6
weeks 6 days.  No subchorionic hemorrhage.  Normal sonographic
appearance to the right ovary.  Left ovary not visualized.  No free
fluid.
IMPRESSION: Embryo measures 8.5 mm however no cardiac activity documented.
This is concerning for embryonic demise. Recommend clinical
correlation and serial quantitative beta HCG or follow-up
ultrasound if warranted.

Discussed via telephone with Dr. Jim at [DATE] a.m. on
02/09/2012.

## 2014-02-24 ENCOUNTER — Emergency Department: Payer: Self-pay | Admitting: Emergency Medicine

## 2014-02-24 LAB — CBC
HCT: 40 % (ref 35.0–47.0)
HGB: 13.1 g/dL (ref 12.0–16.0)
MCH: 29.5 pg (ref 26.0–34.0)
MCHC: 32.8 g/dL (ref 32.0–36.0)
MCV: 90 fL (ref 80–100)
PLATELETS: 237 10*3/uL (ref 150–440)
RBC: 4.44 10*6/uL (ref 3.80–5.20)
RDW: 13 % (ref 11.5–14.5)
WBC: 7.1 10*3/uL (ref 3.6–11.0)

## 2014-02-24 LAB — COMPREHENSIVE METABOLIC PANEL
AST: 26 U/L (ref 15–37)
Albumin: 3.9 g/dL (ref 3.4–5.0)
Alkaline Phosphatase: 51 U/L
Anion Gap: 8 (ref 7–16)
BUN: 10 mg/dL (ref 7–18)
Bilirubin,Total: 0.5 mg/dL (ref 0.2–1.0)
CALCIUM: 9.3 mg/dL (ref 8.5–10.1)
Chloride: 106 mmol/L (ref 98–107)
Co2: 28 mmol/L (ref 21–32)
Creatinine: 0.9 mg/dL (ref 0.60–1.30)
EGFR (Non-African Amer.): >60
Glucose: 122 mg/dL — ABNORMAL HIGH (ref 65–99)
OSMOLALITY: 283 (ref 275–301)
Potassium: 3.7 mmol/L (ref 3.5–5.1)
SGPT (ALT): 27 U/L (ref 12–78)
Sodium: 142 mmol/L (ref 136–145)
TOTAL PROTEIN: 7.4 g/dL (ref 6.4–8.2)

## 2014-02-24 LAB — WET PREP, GENITAL

## 2014-02-24 LAB — URINALYSIS, COMPLETE
Bacteria: NONE SEEN
Bilirubin,UR: NEGATIVE
Blood: NEGATIVE
Glucose,UR: NEGATIVE mg/dL (ref 0–75)
Ketone: NEGATIVE
Leukocyte Esterase: NEGATIVE
NITRITE: NEGATIVE
PROTEIN: NEGATIVE
Ph: 6 (ref 4.5–8.0)
RBC,UR: 3 /HPF (ref 0–5)
SPECIFIC GRAVITY: 1.021 (ref 1.003–1.030)

## 2014-02-24 LAB — GC/CHLAMYDIA PROBE AMP

## 2014-02-25 ENCOUNTER — Telehealth: Payer: Self-pay | Admitting: Emergency Medicine

## 2014-02-25 DIAGNOSIS — Z8639 Personal history of other endocrine, nutritional and metabolic disease: Secondary | ICD-10-CM | POA: Diagnosis not present

## 2014-02-25 DIAGNOSIS — Z8632 Personal history of gestational diabetes: Secondary | ICD-10-CM | POA: Diagnosis not present

## 2014-02-25 DIAGNOSIS — Z791 Long term (current) use of non-steroidal anti-inflammatories (NSAID): Secondary | ICD-10-CM | POA: Insufficient documentation

## 2014-02-25 DIAGNOSIS — Z8619 Personal history of other infectious and parasitic diseases: Secondary | ICD-10-CM | POA: Diagnosis not present

## 2014-02-25 DIAGNOSIS — Z862 Personal history of diseases of the blood and blood-forming organs and certain disorders involving the immune mechanism: Secondary | ICD-10-CM | POA: Diagnosis not present

## 2014-02-25 DIAGNOSIS — N739 Female pelvic inflammatory disease, unspecified: Secondary | ICD-10-CM | POA: Insufficient documentation

## 2014-02-25 DIAGNOSIS — Z7982 Long term (current) use of aspirin: Secondary | ICD-10-CM | POA: Diagnosis not present

## 2014-02-25 DIAGNOSIS — Z87440 Personal history of urinary (tract) infections: Secondary | ICD-10-CM | POA: Insufficient documentation

## 2014-02-25 DIAGNOSIS — K5732 Diverticulitis of large intestine without perforation or abscess without bleeding: Secondary | ICD-10-CM | POA: Diagnosis not present

## 2014-02-25 DIAGNOSIS — R109 Unspecified abdominal pain: Secondary | ICD-10-CM | POA: Diagnosis not present

## 2014-02-25 DIAGNOSIS — Z3202 Encounter for pregnancy test, result negative: Secondary | ICD-10-CM | POA: Diagnosis not present

## 2014-02-25 DIAGNOSIS — N949 Unspecified condition associated with female genital organs and menstrual cycle: Secondary | ICD-10-CM | POA: Diagnosis present

## 2014-02-25 NOTE — Telephone Encounter (Signed)
Emergency Line  Call Patient states she was [facility] Friday and diagnosed with PID.  She was given an antibiotic shot and a pill in the ED and discharged on Flagyl and doxycycline.  She states she has been taking the antibiotics as prescribed, but has had worsening pain and fevers today.  She did take a temperature and it was 102.7.  Tylenol and ibuprofen are not helping the pain.  I recommended that she go the ED for further evaluation as she appears to have failed outpatient management at this time.  She is agreeable with this plan.

## 2014-02-26 ENCOUNTER — Emergency Department (HOSPITAL_COMMUNITY): Payer: Medicaid Other

## 2014-02-26 ENCOUNTER — Encounter (HOSPITAL_COMMUNITY): Payer: Self-pay | Admitting: Emergency Medicine

## 2014-02-26 ENCOUNTER — Emergency Department (HOSPITAL_COMMUNITY)
Admission: EM | Admit: 2014-02-26 | Discharge: 2014-02-26 | Disposition: A | Discharge status: Home / Self Care | Payer: Medicaid Other | Attending: Emergency Medicine | Admitting: Emergency Medicine

## 2014-02-26 DIAGNOSIS — K5732 Diverticulitis of large intestine without perforation or abscess without bleeding: Secondary | ICD-10-CM

## 2014-02-26 DIAGNOSIS — R1030 Lower abdominal pain, unspecified: Secondary | ICD-10-CM

## 2014-02-26 LAB — URINALYSIS, ROUTINE W REFLEX MICROSCOPIC
BILIRUBIN URINE: NEGATIVE
Glucose, UA: NEGATIVE mg/dL
HGB URINE DIPSTICK: NEGATIVE
Ketones, ur: NEGATIVE mg/dL
Leukocytes, UA: NEGATIVE
NITRITE: NEGATIVE
PH: 5.5 (ref 5.0–8.0)
Protein, ur: NEGATIVE mg/dL
Specific Gravity, Urine: 1.027 (ref 1.005–1.030)
UROBILINOGEN UA: 0.2 mg/dL (ref 0.0–1.0)

## 2014-02-26 LAB — GC/CHLAMYDIA PROBE AMP
CT Probe RNA: NEGATIVE
GC Probe RNA: NEGATIVE

## 2014-02-26 LAB — CBC WITH DIFFERENTIAL/PLATELET
Basophils Absolute: 0 10*3/uL (ref 0.0–0.1)
Basophils Relative: 0 % (ref 0–1)
Eosinophils Absolute: 0.1 10*3/uL (ref 0.0–0.7)
Eosinophils Relative: 1 % (ref 0–5)
HCT: 41.4 % (ref 36.0–46.0)
Hemoglobin: 13.8 g/dL (ref 12.0–15.0)
LYMPHS PCT: 36 % (ref 12–46)
Lymphs Abs: 3.2 10*3/uL (ref 0.7–4.0)
MCH: 29.9 pg (ref 26.0–34.0)
MCHC: 33.3 g/dL (ref 30.0–36.0)
MCV: 89.8 fL (ref 78.0–100.0)
MONOS PCT: 5 % (ref 3–12)
Monocytes Absolute: 0.4 10*3/uL (ref 0.1–1.0)
NEUTROS PCT: 58 % (ref 43–77)
Neutro Abs: 5.3 10*3/uL (ref 1.7–7.7)
PLATELETS: 245 10*3/uL (ref 150–400)
RBC: 4.61 MIL/uL (ref 3.87–5.11)
RDW: 12.8 % (ref 11.5–15.5)
WBC: 9 10*3/uL (ref 4.0–10.5)

## 2014-02-26 LAB — PREGNANCY, URINE: PREG TEST UR: NEGATIVE

## 2014-02-26 LAB — COMPREHENSIVE METABOLIC PANEL
ALK PHOS: 51 U/L (ref 39–117)
ALT: 15 U/L (ref 0–35)
AST: 22 U/L (ref 0–37)
Albumin: 4.2 g/dL (ref 3.5–5.2)
BILIRUBIN TOTAL: 0.5 mg/dL (ref 0.3–1.2)
BUN: 12 mg/dL (ref 6–23)
CHLORIDE: 99 meq/L (ref 96–112)
CO2: 24 meq/L (ref 19–32)
Calcium: 9.2 mg/dL (ref 8.4–10.5)
Creatinine, Ser: 0.64 mg/dL (ref 0.50–1.10)
GLUCOSE: 109 mg/dL — AB (ref 70–99)
POTASSIUM: 4.5 meq/L (ref 3.7–5.3)
SODIUM: 138 meq/L (ref 137–147)
Total Protein: 7.8 g/dL (ref 6.0–8.3)

## 2014-02-26 LAB — WET PREP, GENITAL
Trich, Wet Prep: NONE SEEN
Yeast Wet Prep HPF POC: NONE SEEN

## 2014-02-26 MED ORDER — IOHEXOL 300 MG/ML  SOLN
25.0000 mL | Freq: Once | INTRAMUSCULAR | Status: AC | PRN
  Administered 2014-02-26: 25 mL via ORAL

## 2014-02-26 MED ORDER — IOHEXOL 300 MG/ML  SOLN
80.0000 mL | Freq: Once | INTRAMUSCULAR | Status: AC | PRN
  Administered 2014-02-26: 80 mL via INTRAVENOUS

## 2014-02-26 MED ORDER — MORPHINE SULFATE 4 MG/ML IJ SOLN
4.0000 mg | Freq: Once | INTRAMUSCULAR | Status: AC
Start: 2014-02-26 — End: 2014-02-26
  Administered 2014-02-26: 4 mg via INTRAVENOUS
  Filled 2014-02-26: qty 1

## 2014-02-26 MED ORDER — CIPROFLOXACIN HCL 500 MG PO TABS: 500.0000 mg | ORAL_TABLET | Freq: Two times a day (BID) | ORAL | Status: DC

## 2014-02-26 MED ORDER — MORPHINE SULFATE 4 MG/ML IJ SOLN
4.0000 mg | Freq: Once | INTRAMUSCULAR | Status: AC
  Administered 2014-02-26: 4 mg via INTRAVENOUS
  Filled 2014-02-26: qty 1

## 2014-02-26 MED ORDER — METRONIDAZOLE 500 MG PO TABS: 500.0000 mg | ORAL_TABLET | Freq: Three times a day (TID) | ORAL | Status: DC

## 2014-02-26 MED ORDER — ONDANSETRON HCL 4 MG/2ML IJ SOLN
4.0000 mg | Freq: Once | INTRAMUSCULAR | Status: AC
  Administered 2014-02-26: 4 mg via INTRAVENOUS
  Filled 2014-02-26: qty 2

## 2014-02-26 MED ORDER — ONDANSETRON HCL 4 MG PO TABS: 4.0000 mg | ORAL_TABLET | Freq: Three times a day (TID) | ORAL | Status: DC | PRN

## 2014-02-26 MED ORDER — SODIUM CHLORIDE 0.9 % IV BOLUS (SEPSIS)
1000.0000 mL | Freq: Once | INTRAVENOUS | Status: AC
  Administered 2014-02-26: 1000 mL via INTRAVENOUS

## 2014-02-26 MED ORDER — HYDROCODONE-ACETAMINOPHEN 5-325 MG PO TABS: 1.0000 | ORAL_TABLET | ORAL | Status: DC | PRN

## 2014-02-26 NOTE — ED Notes (Signed)
Pt in US, report received from Clydie BraunKaren, Charity fundraiserN.

## 2014-02-26 NOTE — ED Notes (Addendum)
Alert, NAD, calm, drinking contrast.

## 2014-02-26 NOTE — ED Notes (Signed)
Pt. reports persistent low pelvic pain radiating to lower back onset Friday , seen at [facility] diagnosed with PID , denies fever / no nausea or emesis , currently taking oral antibiotic.

## 2014-02-26 NOTE — ED Provider Notes (Signed)
6:08 AM Sign out received at change of shift from [facility] Suite[facility] Humes, [place]-C.  Pt with recent diagnosis of [facility] by [facility], pt given Rocephin and Azithromycin, home on doxycycline, flagyl.  C/O worsening pelvic pain and new onset fever to 102 at home.  Labs, Pelvic US unremarkable.  Pending CT abd/pelvis.  If negative, pt may be d/c home with pain medications, to continue antibiotics.   Pt seen and examined.  Abdomen soft, nondistended, TTP diffusely over lower abdomen, L>R, no guarding, no rebound.    8:51 AM CT shows diverticulitis.  Will adjust abx, d/c pt home with pain medication, cipro, flagyl.  As I did not perform pelvic and pt previously dx with PID, will advise to continue doxy as before, will change flagyl to TID.  Pt advised of changes to antibiotics.    Discussed result, findings, treatment, and follow up  with patient.  Pt given return precautions.  Pt verbalizes understanding and agrees with plan.       Results for orders placed during the hospital encounter of 02/26/14  WET PREP, GENITAL      Result Value Ref Range   Yeast Wet Prep HPF POC NONE SEEN  NONE SEEN   Trich, Wet Prep NONE SEEN  NONE SEEN   Clue Cells Wet Prep HPF POC MODERATE (*) NONE SEEN   WBC, Wet Prep HPF POC FEW (*) NONE SEEN  URINALYSIS, ROUTINE W REFLEX MICROSCOPIC      Result Value Ref Range   Color, Urine YELLOW  YELLOW   APPearance CLEAR  CLEAR   Specific Gravity, Urine 1.027  1.005 - 1.030   pH 5.5  5.0 - 8.0   Glucose, UA NEGATIVE  NEGATIVE mg/dL   Hgb urine dipstick NEGATIVE  NEGATIVE   Bilirubin Urine NEGATIVE  NEGATIVE   Ketones, ur NEGATIVE  NEGATIVE mg/dL   Protein, ur NEGATIVE  NEGATIVE mg/dL   Urobilinogen, UA 0.2  0.0 - 1.0 mg/dL   Nitrite NEGATIVE  NEGATIVE   Leukocytes, UA NEGATIVE  NEGATIVE  CBC WITH DIFFERENTIAL      Result Value Ref Range   WBC 9.0  4.0 - 10.5 K/uL   RBC 4.61  3.87 - 5.11 MIL/uL   Hemoglobin 13.8  12.0 - 15.0 g/dL   HCT 29.541.4  28.436.0 - 13.246.0 %   MCV 89.8   78.0 - 100.0 fL   MCH 29.9  26.0 - 34.0 pg   MCHC 33.3  30.0 - 36.0 g/dL   RDW 44.012.8  10.211.5 - 72.515.5 %   Platelets 245  150 - 400 K/uL   Neutrophils Relative % 58  43 - 77 %   Neutro Abs 5.3  1.7 - 7.7 K/uL   Lymphocytes Relative 36  12 - 46 %   Lymphs Abs 3.2  0.7 - 4.0 K/uL   Monocytes Relative 5  3 - 12 %   Monocytes Absolute 0.4  0.1 - 1.0 K/uL   Eosinophils Relative 1  0 - 5 %   Eosinophils Absolute 0.1  0.0 - 0.7 K/uL   Basophils Relative 0  0 - 1 %   Basophils Absolute 0.0  0.0 - 0.1 K/uL  COMPREHENSIVE METABOLIC PANEL      Result Value Ref Range   Sodium 138  137 - 147 mEq/L   Potassium 4.5  3.7 - 5.3 mEq/L   Chloride 99  96 - 112 mEq/L   CO2 24  19 - 32 mEq/L   Glucose, Bld 109 (*)  70 - 99 mg/dL   BUN 12  6 - 23 mg/dL   Creatinine, Ser 1.61  0.50 - 1.10 mg/dL   Calcium 9.2  8.4 - 09.6 mg/dL   Total Protein 7.8  6.0 - 8.3 g/dL   Albumin 4.2  3.5 - 5.2 g/dL   AST 22  0 - 37 U/L   ALT 15  0 - 35 U/L   Alkaline Phosphatase 51  39 - 117 U/L   Total Bilirubin 0.5  0.3 - 1.2 mg/dL   GFR calc non Af Amer >90  >90 mL/min   GFR calc Af Amer >90  >90 mL/min  PREGNANCY, URINE      Result Value Ref Range   Preg Test, Ur NEGATIVE  NEGATIVE   US Transvaginal Non-ob  02/26/2014   CLINICAL DATA:  Pain, rule out torsion.  EXAM: TRANSABDOMINAL AND TRANSVAGINAL ULTRASOUND OF PELVIS  DOPPLER ULTRASOUND OF OVARIES  TECHNIQUE: Both transabdominal and transvaginal ultrasound examinations of the pelvis were performed. Transabdominal technique was performed for global imaging of the pelvis including uterus, ovaries, adnexal regions, and pelvic cul-de-sac.  It was necessary to proceed with endovaginal exam following the transabdominal exam to visualize the adnexa. Color and duplex Doppler ultrasound was utilized to evaluate blood flow to the ovaries.  COMPARISON:  Pelvic ultrasound July 12, 2013  FINDINGS: Uterus  Measurements: 11.4 x 6.5 x 8 cm. No fibroids or other mass visualized. Nabothian  cyst noted at the level of the cervix.  Endometrium  Thickness: 9 mm.  No focal abnormality visualized.  Right ovary  Measurements: 3.1 x 2.3 x 2 cm. Normal appearance/no adnexal mass.  Left ovary  Measurements: 3.7 x 1.8 x 2.1 cm. Normal 22 mm follicle on the left. Normal appearance/no adnexal mass.  Pulsed Doppler evaluation of both ovaries demonstrates normal low-resistance arterial and venous waveforms.  Other findings  No free fluid.  IMPRESSION: Normal pelvic sonogram; specifically no sonographic findings of ovarian torsion.   Electronically Signed   By: Awilda Metro   On: 02/26/2014 05:18   US Pelvis Complete  02/26/2014   CLINICAL DATA:  Pain, rule out torsion.  EXAM: TRANSABDOMINAL AND TRANSVAGINAL ULTRASOUND OF PELVIS  DOPPLER ULTRASOUND OF OVARIES  TECHNIQUE: Both transabdominal and transvaginal ultrasound examinations of the pelvis were performed. Transabdominal technique was performed for global imaging of the pelvis including uterus, ovaries, adnexal regions, and pelvic cul-de-sac.  It was necessary to proceed with endovaginal exam following the transabdominal exam to visualize the adnexa. Color and duplex Doppler ultrasound was utilized to evaluate blood flow to the ovaries.  COMPARISON:  Pelvic ultrasound July 12, 2013  FINDINGS: Uterus  Measurements: 11.4 x 6.5 x 8 cm. No fibroids or other mass visualized. Nabothian cyst noted at the level of the cervix.  Endometrium  Thickness: 9 mm.  No focal abnormality visualized.  Right ovary  Measurements: 3.1 x 2.3 x 2 cm. Normal appearance/no adnexal mass.  Left ovary  Measurements: 3.7 x 1.8 x 2.1 cm. Normal 22 mm follicle on the left. Normal appearance/no adnexal mass.  Pulsed Doppler evaluation of both ovaries demonstrates normal low-resistance arterial and venous waveforms.  Other findings  No free fluid.  IMPRESSION: Normal pelvic sonogram; specifically no sonographic findings of ovarian torsion.   Electronically Signed   By: Awilda Metro   On: 02/26/2014 05:18   Ct Abdomen Pelvis W Contrast  02/26/2014   CLINICAL DATA:  Lower pelvic and low back discomfort with fevers, on antibiotics ; history  of previous appendectomy  EXAM: CT ABDOMEN AND PELVIS WITH CONTRAST  TECHNIQUE: Multidetector CT imaging of the abdomen and pelvis was performed using the standard protocol following bolus administration of intravenous contrast.  CONTRAST:  80mL OMNIPAQUE IOHEXOL 300 MG/ML SOLN intravenously ; the patient also received oral contrast material.  COMPARISON:  Pelvic ultrasound of February 26, 2014.  FINDINGS: The liver, gallbladder, pancreas, spleen, adrenal glands, and kidneys are normal. There is a small hiatal hernia. The stomach and small bowel are normal. The terminal ileum and cecum are normal. There are scattered sigmoid diverticula and there is a focal area of slightly increased perisigmoid fat density on images 62 through 68 which may reflect focal diverticulitis.  The uterus demonstrates slightly heterogeneous enhancement but no focal mass. The adnexal structures are within the limits of normal for age. There is no free fluid in the abdomen or pelvis. The partially distended urinary bladder is normal.  The abdominal aorta, psoas musculature, lumbar spine, and bony pelvis are unremarkable. The lung bases are clear.  IMPRESSION: 1. Mild inflammatory changes associated with the proximal sigmoid colon consistent with low-grade diverticulitis. There is no ileus or obstruction or evidence of perforation. 2. There is no acute urinary tract or acute gynecological abnormality   Electronically Signed   By: David  [place]   On: 02/26/2014 08:31   [place] Art/ven Flow Abd Pelv Doppler  02/26/2014   CLINICAL DATA:  Pain, rule out torsion.  EXAM: TRANSABDOMINAL AND TRANSVAGINAL ULTRASOUND OF PELVIS  DOPPLER ULTRASOUND OF OVARIES  TECHNIQUE: Both transabdominal and transvaginal ultrasound examinations of the pelvis were performed. Transabdominal technique was  performed for global imaging of the pelvis including uterus, ovaries, adnexal regions, and pelvic cul-de-sac.  It was necessary to proceed with endovaginal exam following the transabdominal exam to visualize the adnexa. Color and duplex Doppler ultrasound was utilized to evaluate blood flow to the ovaries.  COMPARISON:  Pelvic ultrasound July 12, 2013  FINDINGS: Uterus  Measurements: 11.4 x 6.5 x 8 cm. No fibroids or other mass visualized. Nabothian cyst noted at the level of the cervix.  Endometrium  Thickness: 9 mm.  No focal abnormality visualized.  Right ovary  Measurements: 3.1 x 2.3 x 2 cm. Normal appearance/no adnexal mass.  Left ovary  Measurements: 3.7 x 1.8 x 2.1 cm. Normal 22 mm follicle on the left. Normal appearance/no adnexal mass.  Pulsed Doppler evaluation of both ovaries demonstrates normal low-resistance arterial and venous waveforms.  Other findings  No free fluid.  IMPRESSION: Normal pelvic sonogram; specifically no sonographic findings of ovarian torsion.   Electronically Signed   By: Awilda Metroourtnay  Bloomer   On: 02/26/2014 05:18      Trixie DredgeEmily West, PA-C 02/26/14 [id number]

## 2014-02-26 NOTE — ED Provider Notes (Signed)
CSN: [id number]     Arrival date & time 02/25/14  2347 History   First MD Initiated Contact with Patient 02/26/14 0345     Chief Complaint  Patient presents with  . Pelvic Pain    (Consider location/radiation/quality/duration/timing/severity/associated sxs/prior Treatment) HPI Comments: 48 year old female with a history of hyperlipidemia, urinary tract infection, and PID presents to the emergency department for lower abdominal/pelvic pain. Patient states that symptoms began 4 days ago and have been gradually worsening since onset. Patient states that she was seen in the day symptoms began at [facility] and diagnosed with PID. Patient states that she was treated with Rocephin and azithromycin and discharged on doxycycline and Flagyl. Patient states that she has been taking his medications as prescribed, but her pain has been worsening. Patient states that pain is a constant aching sensation with intermittent sharp pains. Symptoms became associated with fever of 102.86F orally prior to arrival. Patient has been taking antipyretics with temporary, intermittent relief. Patient denies any alleviating factors of her abdominal pain. She states the pain is worse with palpation. Patient denies associated syncope, chest pain, shortness of breath, nausea or vomiting, diarrhea, melena or hematochezia, vaginal bleeding or discharge, numbness/tingling, and paresthesias. Patient has a history of appendectomy. No other surgical history.  Patient is a 48 y.o. female presenting with pelvic pain. The history is provided by the patient. No language interpreter was used.  Pelvic Pain Associated symptoms include abdominal pain and a fever. Pertinent negatives include no chest pain, nausea, numbness, vomiting or weakness.    Past Medical History  Diagnosis Date  . Hyperlipidemia   . Gestational diabetes     diet controlled with #3  . Preterm labor     with last 4 preg, del at term  . Urinary tract infection    . Abnormal Pap smear   . PID (pelvic inflammatory disease)   . Hyperlipemia   . Sarcoidosis    Past Surgical History  Procedure Laterality Date  . Appendectomy    . Induced abortion     Family History  Problem Relation Age of Onset  . Heart disease Mother   . Hypertension Mother   . Diabetes Father   . Heart disease Father   . Anesthesia problems Neg Hx    History  Substance Use Topics  . Smoking status: Never Smoker   . Smokeless tobacco: Never Used  . Alcohol Use: No   OB History   Grav Para Term Preterm Abortions TAB SAB Ect Mult Living   8 6 6  0 2 1 1  0 0 6     Review of Systems  Constitutional: Positive for fever.  Respiratory: Negative for shortness of breath.   Cardiovascular: Negative for chest pain.  Gastrointestinal: Positive for abdominal pain. Negative for nausea and vomiting.  Genitourinary: Positive for pelvic pain. Negative for dysuria, hematuria, vaginal bleeding and vaginal discharge.  Musculoskeletal: Positive for back pain.  Neurological: Negative for weakness and numbness.  All other systems reviewed and are negative.    Allergies  Sulfamethoxazole-trimethoprim  Home Medications   Prior to Admission medications   Medication Sig Start Date End Date Taking? Authorizing Provider  acetaminophen (TYLENOL) 325 MG tablet Take 650 mg by mouth every 6 (six) hours as needed for mild pain.   Yes Historical Provider, MD  aspirin EC 81 MG tablet Take 81 mg by mouth daily.   Yes Historical Provider, MD  ibuprofen (ADVIL,MOTRIN) 200 MG tablet Take 600 mg by mouth every 6 (six) hours  as needed for mild pain.   Yes Historical Provider, MD  meloxicam (MOBIC) 15 MG tablet Take 15 mg by mouth daily as needed for pain.   Yes Historical Provider, MD   BP 107/70  Pulse 82  Temp(Src) 98 F (36.7 C) (Oral)  Resp 17  Ht 5\' 4"  (1.626 m)  Wt 207 lb 11.2 oz (94.212 kg)  BMI 35.63 kg/m2  SpO2 95%  Physical Exam  Nursing note and vitals  reviewed. Constitutional: She is oriented to person, place, and time. She appears well-developed and well-nourished. No distress.  Nontoxic/nonseptic appearing  HENT:  Head: Normocephalic and atraumatic.  Eyes: Conjunctivae and EOM are normal. No scleral icterus.  Neck: Normal range of motion.  Cardiovascular: Normal rate, regular rhythm, normal heart sounds and intact distal pulses.   Pulmonary/Chest: Effort normal and breath sounds normal. No respiratory distress. She has no wheezes. She has no rales.  Abdominal: Soft. She exhibits no distension. There is tenderness (suprapubic and b/l lower quadrants). There is no rebound and no guarding.  Soft obese abdomen with TTP in b/l lower quadrants and along suprapubic region. No masses or peritoneal signs.  Genitourinary: Vagina normal. There is no rash, tenderness, lesion or injury on the right labia. There is no rash, tenderness, lesion or injury on the left labia. Uterus is tender. Cervix exhibits no motion tenderness, no discharge and no friability. Right adnexum displays tenderness. Right adnexum displays no mass. Left adnexum displays tenderness. Left adnexum displays no mass.  Musculoskeletal: Normal range of motion.  Neurological: She is alert and oriented to person, place, and time. She exhibits normal muscle tone. Coordination normal.  GCS 15. Patient moves extremities without ataxia.  Skin: Skin is warm and dry. No rash noted. She is not diaphoretic. No erythema. No pallor.  Psychiatric: She has a normal mood and affect. Her behavior is normal.    ED Course  Procedures (including critical care time) Labs Review Labs Reviewed  WET PREP, GENITAL - Abnormal; Notable for the following:    Clue Cells Wet Prep HPF POC MODERATE (*)    WBC, Wet Prep HPF POC FEW (*)    All other components within normal limits  COMPREHENSIVE METABOLIC PANEL - Abnormal; Notable for the following:    Glucose, Bld 109 (*)    All other components within normal  limits  GC/CHLAMYDIA PROBE AMP  URINALYSIS, ROUTINE W REFLEX MICROSCOPIC  CBC WITH DIFFERENTIAL  PREGNANCY, URINE    Imaging Review US Transvaginal Non-ob  02/26/2014   CLINICAL DATA:  Pain, rule out torsion.  EXAM: TRANSABDOMINAL AND TRANSVAGINAL ULTRASOUND OF PELVIS  DOPPLER ULTRASOUND OF OVARIES  TECHNIQUE: Both transabdominal and transvaginal ultrasound examinations of the pelvis were performed. Transabdominal technique was performed for global imaging of the pelvis including uterus, ovaries, adnexal regions, and pelvic cul-de-sac.  It was necessary to proceed with endovaginal exam following the transabdominal exam to visualize the adnexa. Color and duplex Doppler ultrasound was utilized to evaluate blood flow to the ovaries.  COMPARISON:  Pelvic ultrasound July 12, 2013  FINDINGS: Uterus  Measurements: 11.4 x 6.5 x 8 cm. No fibroids or other mass visualized. Nabothian cyst noted at the level of the cervix.  Endometrium  Thickness: 9 mm.  No focal abnormality visualized.  Right ovary  Measurements: 3.1 x 2.3 x 2 cm. Normal appearance/no adnexal mass.  Left ovary  Measurements: 3.7 x 1.8 x 2.1 cm. Normal 22 mm follicle on the left. Normal appearance/no adnexal mass.  Pulsed Doppler evaluation of  both ovaries demonstrates normal low-resistance arterial and venous waveforms.  Other findings  No free fluid.  IMPRESSION: Normal pelvic sonogram; specifically no sonographic findings of ovarian torsion.   Electronically Signed   By: Awilda Metroourtnay  Bloomer   On: 02/26/2014 05:18   [place] Pelvis Complete  02/26/2014   CLINICAL DATA:  Pain, rule out torsion.  EXAM: TRANSABDOMINAL AND TRANSVAGINAL ULTRASOUND OF PELVIS  DOPPLER ULTRASOUND OF OVARIES  TECHNIQUE: Both transabdominal and transvaginal ultrasound examinations of the pelvis were performed. Transabdominal technique was performed for global imaging of the pelvis including uterus, ovaries, adnexal regions, and pelvic cul-de-sac.  It was necessary to proceed  with endovaginal exam following the transabdominal exam to visualize the adnexa. Color and duplex Doppler ultrasound was utilized to evaluate blood flow to the ovaries.  COMPARISON:  Pelvic ultrasound July 12, 2013  FINDINGS: Uterus  Measurements: 11.4 x 6.5 x 8 cm. No fibroids or other mass visualized. Nabothian cyst noted at the level of the cervix.  Endometrium  Thickness: 9 mm.  No focal abnormality visualized.  Right ovary  Measurements: 3.1 x 2.3 x 2 cm. Normal appearance/no adnexal mass.  Left ovary  Measurements: 3.7 x 1.8 x 2.1 cm. Normal 22 mm follicle on the left. Normal appearance/no adnexal mass.  Pulsed Doppler evaluation of both ovaries demonstrates normal low-resistance arterial and venous waveforms.  Other findings  No free fluid.  IMPRESSION: Normal pelvic sonogram; specifically no sonographic findings of ovarian torsion.   Electronically Signed   By: Awilda Metroourtnay  Bloomer   On: 02/26/2014 05:18   [place] Art/ven Flow Abd Pelv Doppler  02/26/2014   CLINICAL DATA:  Pain, rule out torsion.  EXAM: TRANSABDOMINAL AND TRANSVAGINAL ULTRASOUND OF PELVIS  DOPPLER ULTRASOUND OF OVARIES  TECHNIQUE: Both transabdominal and transvaginal ultrasound examinations of the pelvis were performed. Transabdominal technique was performed for global imaging of the pelvis including uterus, ovaries, adnexal regions, and pelvic cul-de-sac.  It was necessary to proceed with endovaginal exam following the transabdominal exam to visualize the adnexa. Color and duplex Doppler ultrasound was utilized to evaluate blood flow to the ovaries.  COMPARISON:  Pelvic ultrasound July 12, 2013  FINDINGS: Uterus  Measurements: 11.4 x 6.5 x 8 cm. No fibroids or other mass visualized. Nabothian cyst noted at the level of the cervix.  Endometrium  Thickness: 9 mm.  No focal abnormality visualized.  Right ovary  Measurements: 3.1 x 2.3 x 2 cm. Normal appearance/no adnexal mass.  Left ovary  Measurements: 3.7 x 1.8 x 2.1 cm. Normal 22 mm  follicle on the left. Normal appearance/no adnexal mass.  Pulsed Doppler evaluation of both ovaries demonstrates normal low-resistance arterial and venous waveforms.  Other findings  No free fluid.  IMPRESSION: Normal pelvic sonogram; specifically no sonographic findings of ovarian torsion.   Electronically Signed   By: Awilda Metroourtnay  Bloomer   On: 02/26/2014 05:18     EKG Interpretation None      MDM   Final diagnoses:  Lower abdominal pain    48 year old female presents for lower abdominal/pelvic pain x4 days. Patient diagnosed with PID at [facility] 3 days ago. She has been compliant with her antibiotics with no improvement in symptoms. Pain has continued to worsen and patient endorses fever developing today. Fever 102.5F prior to arrival, after which time patient took antipyretics.  Workup today without significant findings. CBC, CMP, urinalysis, and wet prep non-concerning. Pelvic ultrasound without any findings to suggest origin of pain or fever. Will further evaluate with CT scan. Patient signed  out to [facility], PA-C shift change. Plan includes d/c if CT negative with instruction to complete prescribed abx course; disposition as appropriate if imaging shows acute, emergent findings.   Filed Vitals:   02/26/14 0519 02/26/14 0530 02/26/14 0545 02/26/14 0600  BP: 116/73 110/66 115/74 107/70  Pulse: 75 82 70 82  Temp:      TempSrc:      Resp:      Height:      Weight:      SpO2: 100% 100% 99% 95%       Antony Madura, PA-C 02/26/14 0701

## 2014-02-26 NOTE — Discharge Instructions (Signed)
Read the information below.  Use the prescribed medication as directed.  Please discuss all new medications with your pharmacist.  Do not take additional tylenol while taking the prescribed pain medication to avoid overdose.  You may return to the Emergency Department at any time for worsening condition or any new symptoms that concern you.  If you develop high fevers, worsening abdominal pain, uncontrolled vomiting, or are unable to tolerate fluids by mouth, return to the ER for a recheck.    Please take the Ciprofloxacin (new prescription), the doxycycline as before, and the Flagyl (metronidazole) as the new prescription shows (three times daily).  Do not drink alcohol while taking the antibiotics.     Diverticulitis Diverticulitis is inflammation or infection of small pouches in your colon that form when you have a condition called diverticulosis. The pouches in your colon are called diverticula. Your colon, or large intestine, is where water is absorbed and stool is formed. Complications of diverticulitis can include:  Bleeding.  Severe infection.  Severe pain.  Perforation of your colon.  Obstruction of your colon. CAUSES  Diverticulitis is caused by bacteria. Diverticulitis happens when stool becomes trapped in diverticula. This allows bacteria to grow in the diverticula, which can lead to inflammation and infection. RISK FACTORS People with diverticulosis are at risk for diverticulitis. Eating a diet that does not include enough fiber from fruits and vegetables may make diverticulitis more likely to develop. SYMPTOMS  Symptoms of diverticulitis may include:  Abdominal pain and tenderness. The pain is normally located on the left side of the abdomen, but may occur in other areas.  Fever and chills.  Bloating.  Cramping.  Nausea.  Vomiting.  Constipation.  Diarrhea.  Blood in your stool. DIAGNOSIS  Your health care provider will ask you about your medical history and  do a physical exam. You may need to have tests done because many medical conditions can cause the same symptoms as diverticulitis. Tests may include:  Blood tests.  Urine tests.  Imaging tests of the abdomen, including X-rays and CT scans. When your condition is under control, your health care provider may recommend that you have a colonoscopy. A colonoscopy can show how severe your diverticula are and whether something else is causing your symptoms. TREATMENT  Most cases of diverticulitis are mild and can be treated at home. Treatment may include:  Taking over-the-counter pain medicines.  Following a clear liquid diet.  Taking antibiotic medicines by mouth for 7-10 days. More severe cases may be treated at a hospital. Treatment may include:  Not eating or drinking.  Taking prescription pain medicine.  Receiving antibiotic medicines through an IV tube.  Receiving fluids and nutrition through an IV tube.  Surgery. HOME CARE INSTRUCTIONS   Follow your health care provider's instructions carefully.  Follow a full liquid diet or other diet as directed by your health care provider. After your symptoms improve, your health care provider may tell you to change your diet. He or she may recommend you eat a high-fiber diet. Fruits and vegetables are good sources of fiber. Fiber makes it easier to pass stool.  Take fiber supplements or probiotics as directed by your health care provider.  Only take medicines as directed by your health care provider.  Keep all your follow-up appointments. SEEK MEDICAL CARE IF:   Your pain does not improve.  You have a hard time eating food.  Your bowel movements do not return to normal. SEEK IMMEDIATE MEDICAL CARE IF:   Your  pain becomes worse.  Your symptoms do not get better.  Your symptoms suddenly get worse.  You have a fever.  You have repeated vomiting.  You have bloody or black, tarry stools. MAKE SURE YOU:   Understand these  instructions.  Will watch your condition.  Will get help right away if you are not doing well or get worse. Document Released: 05/27/2005 Document Revised: 08/22/2013 Document Reviewed: 07/12/2013 [facility] Patient Information 2015 [place] [place]. This information is not intended to replace advice given to you by your health care provider. Make sure you discuss any questions you have with your health care provider.

## 2014-02-26 NOTE — ED Notes (Addendum)
Pt back from US. Pt alert, NAD, calm, interactive, resps e/u, speaking in clear complete sentences, VSS. C/o 8/10 pain, also some nausea.

## 2014-02-26 NOTE — ED Notes (Signed)
Pt remains in US

## 2014-02-26 NOTE — ED Provider Notes (Signed)
Medical screening examination/treatment/procedure(s) were performed by non-physician practitioner and as supervising physician I was immediately available for consultation/collaboration.   EKG Interpretation None        Enid SkeensJoshua M Zavitz, MD 02/26/14 [phone]

## 2014-02-26 NOTE — ED Notes (Signed)
Pt to US.

## 2014-02-26 NOTE — ED Notes (Signed)
CT contacted, pt completed PO contrast

## 2014-02-26 NOTE — ED Notes (Signed)
Pt reports having a fever earlier today.  Pt sts it got as high as 102.5.  Sts she has been alternating motrin and tylenol.  Pt sts pain has increased since she was at [facility].  Sts pain was not this severe.  Sts she has been compliant with antibiotics.  Denies any discharge.

## 2014-02-27 NOTE — ED Provider Notes (Signed)
Medical screening examination/treatment/procedure(s) were performed by non-physician practitioner and as supervising physician I was immediately available for consultation/collaboration.   EKG Interpretation None        Enid SkeensJoshua M Zavitz, MD 02/27/14 [phone]

## 2014-03-01 ENCOUNTER — Encounter (HOSPITAL_COMMUNITY): Payer: Self-pay | Admitting: Emergency Medicine

## 2014-03-01 ENCOUNTER — Inpatient Hospital Stay (HOSPITAL_COMMUNITY)
Admission: EM | Admit: 2014-03-01 | Discharge: 2014-03-04 | DRG: 392 | Disposition: A | Discharge status: Home / Self Care | Payer: Medicaid Other | Attending: Emergency Medicine | Admitting: Emergency Medicine

## 2014-03-01 ENCOUNTER — Telehealth: Payer: Self-pay | Admitting: Family Medicine

## 2014-03-01 DIAGNOSIS — E785 Hyperlipidemia, unspecified: Secondary | ICD-10-CM | POA: Diagnosis present

## 2014-03-01 DIAGNOSIS — K5720 Diverticulitis of large intestine with perforation and abscess without bleeding: Secondary | ICD-10-CM

## 2014-03-01 DIAGNOSIS — R1013 Epigastric pain: Secondary | ICD-10-CM

## 2014-03-01 DIAGNOSIS — E7800 Pure hypercholesterolemia, unspecified: Secondary | ICD-10-CM | POA: Diagnosis present

## 2014-03-01 DIAGNOSIS — K5792 Diverticulitis of intestine, part unspecified, without perforation or abscess without bleeding: Secondary | ICD-10-CM | POA: Diagnosis present

## 2014-03-01 DIAGNOSIS — K5732 Diverticulitis of large intestine without perforation or abscess without bleeding: Principal | ICD-10-CM | POA: Diagnosis present

## 2014-03-01 DIAGNOSIS — Z79899 Other long term (current) drug therapy: Secondary | ICD-10-CM

## 2014-03-01 LAB — COMPREHENSIVE METABOLIC PANEL
ALT: 20 U/L (ref 0–35)
AST: 24 U/L (ref 0–37)
Albumin: 3.9 g/dL (ref 3.5–5.2)
Alkaline Phosphatase: 50 U/L (ref 39–117)
Anion gap: 13 (ref 5–15)
BUN: 11 mg/dL (ref 6–23)
CALCIUM: 9.3 mg/dL (ref 8.4–10.5)
CO2: 26 meq/L (ref 19–32)
CREATININE: 0.76 mg/dL (ref 0.50–1.10)
Chloride: 100 mEq/L (ref 96–112)
GFR calc Af Amer: >90 mL/min (ref >90)
GFR calc non Af Amer: >90 mL/min (ref >90)
Glucose, Bld: 104 mg/dL — ABNORMAL HIGH (ref 70–99)
Potassium: 3.7 mEq/L (ref 3.7–5.3)
Sodium: 139 mEq/L (ref 137–147)
Total Bilirubin: 0.3 mg/dL (ref 0.3–1.2)
Total Protein: 7.3 g/dL (ref 6.0–8.3)

## 2014-03-01 LAB — URINALYSIS, ROUTINE W REFLEX MICROSCOPIC
BILIRUBIN URINE: NEGATIVE
GLUCOSE, UA: NEGATIVE mg/dL
Hgb urine dipstick: NEGATIVE
Ketones, ur: NEGATIVE mg/dL
Leukocytes, UA: NEGATIVE
Nitrite: NEGATIVE
PH: 5.5 (ref 5.0–8.0)
PROTEIN: NEGATIVE mg/dL
Specific Gravity, Urine: 1.011 (ref 1.005–1.030)
Urobilinogen, UA: 0.2 mg/dL (ref 0.0–1.0)

## 2014-03-01 LAB — CBC WITH DIFFERENTIAL/PLATELET
BASOS ABS: 0.1 10*3/uL (ref 0.0–0.1)
Basophils Relative: 1 % (ref 0–1)
EOS ABS: 0.2 10*3/uL (ref 0.0–0.7)
EOS PCT: 2 % (ref 0–5)
HEMATOCRIT: 39.1 % (ref 36.0–46.0)
Hemoglobin: 13.2 g/dL (ref 12.0–15.0)
LYMPHS PCT: 47 % — AB (ref 12–46)
Lymphs Abs: 3.7 10*3/uL (ref 0.7–4.0)
MCH: 30 pg (ref 26.0–34.0)
MCHC: 33.8 g/dL (ref 30.0–36.0)
MCV: 88.9 fL (ref 78.0–100.0)
MONO ABS: 0.4 10*3/uL (ref 0.1–1.0)
Monocytes Relative: 5 % (ref 3–12)
Neutro Abs: 3.6 10*3/uL (ref 1.7–7.7)
Neutrophils Relative %: 45 % (ref 43–77)
Platelets: 240 10*3/uL (ref 150–400)
RBC: 4.4 MIL/uL (ref 3.87–5.11)
RDW: 12.6 % (ref 11.5–15.5)
WBC: 7.9 10*3/uL (ref 4.0–10.5)

## 2014-03-01 LAB — I-STAT CHEM 8, ED
BUN: 9 mg/dL (ref 6–23)
CHLORIDE: 103 meq/L (ref 96–112)
Calcium, Ion: 1.22 mmol/L (ref 1.12–1.23)
Creatinine, Ser: 0.8 mg/dL (ref 0.50–1.10)
GLUCOSE: 140 mg/dL — AB (ref 70–99)
HCT: 40 % (ref 36.0–46.0)
Hemoglobin: 13.6 g/dL (ref 12.0–15.0)
Potassium: 3.8 mEq/L (ref 3.7–5.3)
Sodium: 139 mEq/L (ref 137–147)
TCO2: 25 mmol/L (ref 0–100)

## 2014-03-01 LAB — PREGNANCY, URINE: PREG TEST UR: NEGATIVE

## 2014-03-01 MED ORDER — IOHEXOL 300 MG/ML  SOLN
25.0000 mL | Freq: Once | INTRAMUSCULAR | Status: AC | PRN
  Administered 2014-03-01: 25 mL via ORAL

## 2014-03-01 MED ORDER — HYDROMORPHONE HCL PF 1 MG/ML IJ SOLN
1.0000 mg | Freq: Once | INTRAMUSCULAR | Status: AC
  Administered 2014-03-01: 1 mg via INTRAVENOUS
  Filled 2014-03-01: qty 1

## 2014-03-01 MED ORDER — ONDANSETRON HCL 4 MG/2ML IJ SOLN
4.0000 mg | Freq: Once | INTRAMUSCULAR | Status: AC
  Administered 2014-03-01: 4 mg via INTRAVENOUS
  Filled 2014-03-01: qty 2

## 2014-03-01 NOTE — Telephone Encounter (Signed)
Returned call to pt regarding symptoms.  Pt stated she is still having severe abdominal pain.  Per pt last night she could not walk.  This is a new pain that started on her left side.  Pt is taking antibiotics for diverticulitis and PID.  Pt told to continue antibiotics and if pain medication is not helping she needs to return to ED or urgent care for evaluation.  Pt also made a follow up appt with PCP 03/06/2014 at 9:15 AM.  Will forward to PCP for further advise.  Clovis PuMartin, Pruitt Taboada L, RN

## 2014-03-01 NOTE — Telephone Encounter (Signed)
Patient seen at ED 6/29 and dx with Diverticulitis. Patient states the pain has worsened today and she feels meds are not helping. Patient would like to speak to MD or nurse about his. Please advise.

## 2014-03-01 NOTE — ED Provider Notes (Signed)
CSN: [id number]     Arrival date & time 03/01/14  1936 History   First MD Initiated Contact with Patient 03/01/14 2122     Chief Complaint  Patient presents with  . Flank Pain     (Consider location/radiation/quality/duration/timing/severity/associated sxs/prior Treatment) HPI Greta DoomWendy D Calame is a(n) 48 y.o. female who presents to the ED with cc of worsening LLQ abdominal pain and flank pain. Patient was seen in the ED on 02/26/2014 for wame compalin and diagnosed with acute diverticulitis. She had a negative pelvic work up. patient presents today with worsening pain. She atates that last night she was restless and unable to sleep well. She states that around 2 am she was awoken forma sleep swith acute, severe LLQ pain. She states that the pain progressively worsened throughout the day and began to rediate to the L flank. She had 3 episodes of vomiting and vomited up her antibiotics. She she was able to hold down her norco which did not alleviated her pain. She complains of associated myalgias and chills. Denies a history of kidney stones. She has been on soup broth and saltine cracker diet since her diagnosis.  Past Medical History  Diagnosis Date  . Hyperlipidemia   . Gestational diabetes     diet controlled with #3  . Preterm labor     with last 4 preg, del at term  . Urinary tract infection   . Abnormal Pap smear   . PID (pelvic inflammatory disease)   . Hyperlipemia   . Sarcoidosis    Past Surgical History  Procedure Laterality Date  . Appendectomy    . Induced abortion     Family History  Problem Relation Age of Onset  . Heart disease Mother   . Hypertension Mother   . Diabetes Father   . Heart disease Father   . Anesthesia problems Neg Hx    History  Substance Use Topics  . Smoking status: Never Smoker   . Smokeless tobacco: Never Used  . Alcohol Use: No   OB History   Grav Para Term Preterm Abortions TAB SAB Ect Mult Living   8 6 6  0 2 1 1  0 0 6     Review of  Systems  Ten systems reviewed and are negative for acute change, except as noted in the HPI.    Allergies  Sulfamethoxazole-trimethoprim  Home Medications   Prior to Admission medications   Medication Sig Start Date End Date Taking? Authorizing Provider  acetaminophen (TYLENOL) 325 MG tablet Take 650 mg by mouth every 6 (six) hours as needed for mild pain.   Yes Historical Provider, MD  ciprofloxacin (CIPRO) 500 MG tablet Take 1 tablet (500 mg total) by mouth 2 (two) times daily. 02/26/14  Yes Trixie DredgeEmily West, PA-C  doxycycline (VIBRAMYCIN) 100 MG capsule Take 100 mg by mouth 2 (two) times daily.   Yes Historical Provider, MD  HYDROcodone-acetaminophen (NORCO/VICODIN) 5-325 MG per tablet Take 1-2 tablets by mouth every 4 (four) hours as needed for moderate pain or severe pain. 02/26/14  Yes Trixie DredgeEmily West, PA-C  metroNIDAZOLE (FLAGYL) 500 MG tablet Take 1 tablet (500 mg total) by mouth 3 (three) times daily. 02/26/14  Yes Trixie DredgeEmily West, PA-C  ondansetron (ZOFRAN) 4 MG tablet Take 1 tablet (4 mg total) by mouth every 8 (eight) hours as needed for nausea or vomiting. 02/26/14  Yes Trixie DredgeEmily West, PA-C   BP 120/61  Pulse 76  Temp(Src) 98.1 F (36.7 C) (Oral)  Resp 13  Ht 5'  4" (1.626 m)  Wt 200 lb (90.719 kg)  BMI 34.31 kg/m2  SpO2 100%  LMP 02/06/2014 Physical Exam  Nursing note and vitals reviewed. Constitutional: She is oriented to person, place, and time. She appears well-developed and well-nourished. No distress.  Appears extremely uncomfortable.   HENT:  Head: Normocephalic and atraumatic.  Eyes: Conjunctivae are normal. No scleral icterus.  Neck: Normal range of motion.  Cardiovascular: Normal rate, regular rhythm and normal heart sounds.  Exam reveals no gallop and no friction rub.   No murmur heard. Pulmonary/Chest: Effort normal and breath sounds normal. No respiratory distress.  Abdominal: Soft. Bowel sounds are normal. She exhibits no distension. There is tenderness (Pain LLQ. No cva  tenderness.).  Neurological: She is alert and oriented to person, place, and time.  Skin: Skin is warm and dry. She is not diaphoretic.   Marland Kitchen. ED Course  Procedures (including critical care time) Labs Review Labs Reviewed  CBC WITH DIFFERENTIAL - Abnormal; Notable for the following:    Lymphocytes Relative 47 (*)    All other components within normal limits  COMPREHENSIVE METABOLIC PANEL - Abnormal; Notable for the following:    Glucose, Bld 104 (*)    All other components within normal limits  I-STAT CHEM 8, ED - Abnormal; Notable for the following:    Glucose, Bld 140 (*)    All other components within normal limits  URINALYSIS, ROUTINE W REFLEX MICROSCOPIC  PREGNANCY, URINE    Imaging Review No results found.   EKG Interpretation None      MDM   Final diagnoses:  None    11:28 PM Filed Vitals:   03/01/14 1948 03/01/14 2115  BP: 152/90 120/61  Pulse: 76 76  Temp: 98.5 F (36.9 C) 98.1 F (36.7 C)  TempSrc: Oral Oral  Resp: 24 13  Height: 5\' 4"  (1.626 m)   Weight: 200 lb (90.719 kg)   SpO2: 98% 100%    Patient with decreasing HTN.  Her pain is improving after pain medications. I have concern for failure of her op abx and will repeat a ct scan to r/o perforation. No hematuria or crystals suggestive of kidney stone  1:04 AM Patient seen in shared visit with Dr. Ethelda ChickJacubowitz.  He is concerned with failed OP treatment. Feel the patien needs IP admission and IV abx. Ct reviewed on 6/29 and shows mild inflammatory changes in the sigmoid colon consistent with mild diverticulitis.   [facility]  IV cipro flagyll initiated  Patient accepted for admission by family med service. I spoke with the paitent who agrees with plan of care.  Pt feels improved after observation and/or treatment in ED. Pt stable in ED with no significant deterioration in condition.  I personally reviewed the imaging tests through PACS  system. I have reviewed and interpreted Lab values. I reviewed available ER/hospitalization records through the EMR    Arthor Captainbigail Marca Gadsby, PA-C 03/02/14 1200

## 2014-03-01 NOTE — Telephone Encounter (Signed)
Agree with assessment and recommendation LC

## 2014-03-01 NOTE — ED Notes (Addendum)
Dx Sunday with diverticulitis. Pt reports worsening pain than before when originally seen. Pain is no longer in pelvic area but in left flank. Vomiting this afternoon. No diarr or fevers.

## 2014-03-02 ENCOUNTER — Encounter (HOSPITAL_COMMUNITY): Payer: Self-pay | Admitting: Radiology

## 2014-03-02 ENCOUNTER — Emergency Department (HOSPITAL_COMMUNITY): Payer: Medicaid Other

## 2014-03-02 DIAGNOSIS — K5792 Diverticulitis of intestine, part unspecified, without perforation or abscess without bleeding: Secondary | ICD-10-CM | POA: Diagnosis present

## 2014-03-02 DIAGNOSIS — R1013 Epigastric pain: Secondary | ICD-10-CM

## 2014-03-02 DIAGNOSIS — Z79899 Other long term (current) drug therapy: Secondary | ICD-10-CM | POA: Diagnosis not present

## 2014-03-02 DIAGNOSIS — R1032 Left lower quadrant pain: Secondary | ICD-10-CM | POA: Diagnosis not present

## 2014-03-02 DIAGNOSIS — E785 Hyperlipidemia, unspecified: Secondary | ICD-10-CM | POA: Diagnosis present

## 2014-03-02 DIAGNOSIS — K5732 Diverticulitis of large intestine without perforation or abscess without bleeding: Principal | ICD-10-CM

## 2014-03-02 LAB — CBC WITH DIFFERENTIAL/PLATELET
BASOS ABS: 0.1 10*3/uL (ref 0.0–0.1)
BASOS PCT: 1 % (ref 0–1)
EOS ABS: 0.1 10*3/uL (ref 0.0–0.7)
Eosinophils Relative: 2 % (ref 0–5)
HCT: 37.1 % (ref 36.0–46.0)
Hemoglobin: 12.2 g/dL (ref 12.0–15.0)
LYMPHS ABS: 3.2 10*3/uL (ref 0.7–4.0)
Lymphocytes Relative: 49 % — ABNORMAL HIGH (ref 12–46)
MCH: 29.3 pg (ref 26.0–34.0)
MCHC: 32.9 g/dL (ref 30.0–36.0)
MCV: 89.2 fL (ref 78.0–100.0)
Monocytes Absolute: 0.3 10*3/uL (ref 0.1–1.0)
Monocytes Relative: 5 % (ref 3–12)
NEUTROS PCT: 43 % (ref 43–77)
Neutro Abs: 2.8 10*3/uL (ref 1.7–7.7)
PLATELETS: 220 10*3/uL (ref 150–400)
RBC: 4.16 MIL/uL (ref 3.87–5.11)
RDW: 12.9 % (ref 11.5–15.5)
WBC: 6.5 10*3/uL (ref 4.0–10.5)

## 2014-03-02 LAB — COMPREHENSIVE METABOLIC PANEL
ALT: 17 U/L (ref 0–35)
AST: 21 U/L (ref 0–37)
Albumin: 3.4 g/dL — ABNORMAL LOW (ref 3.5–5.2)
Alkaline Phosphatase: 43 U/L (ref 39–117)
Anion gap: 12 (ref 5–15)
BUN: 9 mg/dL (ref 6–23)
CALCIUM: 8.3 mg/dL — AB (ref 8.4–10.5)
CO2: 26 meq/L (ref 19–32)
CREATININE: 0.76 mg/dL (ref 0.50–1.10)
Chloride: 102 mEq/L (ref 96–112)
GLUCOSE: 100 mg/dL — AB (ref 70–99)
Potassium: 3.6 mEq/L — ABNORMAL LOW (ref 3.7–5.3)
SODIUM: 140 meq/L (ref 137–147)
Total Bilirubin: 0.3 mg/dL (ref 0.3–1.2)
Total Protein: 6.5 g/dL (ref 6.0–8.3)

## 2014-03-02 LAB — LIPASE, BLOOD: LIPASE: 30 U/L (ref 11–59)

## 2014-03-02 MED ORDER — METRONIDAZOLE IN NACL 5-0.79 MG/ML-% IV SOLN
500.0000 mg | Freq: Three times a day (TID) | INTRAVENOUS | Status: DC
  Administered 2014-03-02 (×3): 500 mg via INTRAVENOUS
  Filled 2014-03-02 (×5): qty 100

## 2014-03-02 MED ORDER — MORPHINE SULFATE 2 MG/ML IJ SOLN
2.0000 mg | INTRAMUSCULAR | Status: DC | PRN
  Administered 2014-03-02 (×5): 2 mg via INTRAVENOUS
  Filled 2014-03-02 (×5): qty 1

## 2014-03-02 MED ORDER — MORPHINE SULFATE 2 MG/ML IJ SOLN
INTRAMUSCULAR | Status: AC
  Filled 2014-03-02: qty 1

## 2014-03-02 MED ORDER — BIOTENE DRY MOUTH MT LIQD
15.0000 mL | Freq: Two times a day (BID) | OROMUCOSAL | Status: DC
Start: 2014-03-02 — End: 2014-03-04
  Administered 2014-03-02 – 2014-03-03 (×4): 15 mL via OROMUCOSAL

## 2014-03-02 MED ORDER — SODIUM CHLORIDE 0.9 % IV SOLN
INTRAVENOUS | Status: DC
  Administered 2014-03-02 – 2014-03-03 (×4): via INTRAVENOUS

## 2014-03-02 MED ORDER — CIPROFLOXACIN IN D5W 400 MG/200ML IV SOLN
400.0000 mg | Freq: Once | INTRAVENOUS | Status: AC
  Administered 2014-03-02: 400 mg via INTRAVENOUS
  Filled 2014-03-02: qty 200

## 2014-03-02 MED ORDER — IOHEXOL 300 MG/ML  SOLN
100.0000 mL | Freq: Once | INTRAMUSCULAR | Status: AC | PRN
  Administered 2014-03-02: 100 mL via INTRAVENOUS

## 2014-03-02 MED ORDER — HEPARIN SODIUM (PORCINE) 5000 UNIT/ML IJ SOLN
5000.0000 [IU] | Freq: Three times a day (TID) | INTRAMUSCULAR | Status: DC
  Administered 2014-03-02 – 2014-03-04 (×7): 5000 [IU] via SUBCUTANEOUS
  Filled 2014-03-02 (×10): qty 1

## 2014-03-02 MED ORDER — METRONIDAZOLE IN NACL 5-0.79 MG/ML-% IV SOLN
500.0000 mg | Freq: Once | INTRAVENOUS | Status: AC
  Administered 2014-03-02: 500 mg via INTRAVENOUS
  Filled 2014-03-02: qty 100

## 2014-03-02 MED ORDER — ACETAMINOPHEN 325 MG PO TABS
650.0000 mg | ORAL_TABLET | Freq: Four times a day (QID) | ORAL | Status: DC | PRN
  Administered 2014-03-02: 650 mg via ORAL
  Filled 2014-03-02: qty 2

## 2014-03-02 MED ORDER — ONDANSETRON HCL 4 MG PO TABS: 4.0000 mg | ORAL_TABLET | Freq: Four times a day (QID) | ORAL | Status: DC | PRN

## 2014-03-02 MED ORDER — CIPROFLOXACIN IN D5W 400 MG/200ML IV SOLN
400.0000 mg | Freq: Two times a day (BID) | INTRAVENOUS | Status: DC
  Administered 2014-03-02 (×2): 400 mg via INTRAVENOUS
  Filled 2014-03-02 (×4): qty 200

## 2014-03-02 MED ORDER — ONDANSETRON HCL 4 MG/2ML IJ SOLN
4.0000 mg | Freq: Four times a day (QID) | INTRAMUSCULAR | Status: DC | PRN
  Administered 2014-03-02: 4 mg via INTRAVENOUS
  Filled 2014-03-02: qty 2

## 2014-03-02 MED ORDER — CHLORHEXIDINE GLUCONATE 0.12 % MT SOLN
15.0000 mL | Freq: Two times a day (BID) | OROMUCOSAL | Status: DC
  Administered 2014-03-02 – 2014-03-04 (×5): 15 mL via OROMUCOSAL
  Filled 2014-03-02 (×5): qty 15

## 2014-03-02 NOTE — ED Provider Notes (Signed)
Patient complained of increased abdominal pain today and has vomited 3 times. She denies fever. Presently feels improved since treatment in the emergency department. Diagnosed with diverticulitis 4 days ago. Exam patient alert nontoxic abdomen obese, mild tenderness left side. No guarding  Doug SouSam Haley Kareem, MD 03/02/14 [phone]

## 2014-03-02 NOTE — ED Notes (Signed)
Patient transported to CT 

## 2014-03-02 NOTE — H&P (Signed)
Call Pager [phone] for any questions or notifications regarding this patient  FMTS Attending Admission Note: Haley LevySara Rhema Boyett MD Attending pager:[phone] I  have seen and examined this patient, reviewed their chart. I have discussed this patient with the resident. I agree with the resident's findings, assessment and care plan. Ct scan shows some radiologic clearing of colitis. I think she needs some bowel rest and pain management and will continue her antibiotic therapy. At home, I think she was not allowing appropriate bowel rest. She says pain meds making a big difference.

## 2014-03-02 NOTE — H&P (Signed)
[facility] History and Physical Service Pager: [phone]  Patient name: Haley Mclaughlin Birchmeier Medical record number: [id number] Date of birth: 1966/01/30 Age: 48 y.o. Gender: female  Primary Care Provider: Carney LivingHAMBLISS,MARSHALL L, MD Consultants: None Code Status: Full   Chief Complaint: LLQ pain   Assessment and Plan: Haley Mclaughlin Vecchio is a 48 y.o. female presenting with worsening LLQ pain concerning for Diverticulitis . PMH is significant for HLD  Diverticulitis - States compliance with outpt tx.   - Adfmit to med surg, vitals per unit - IV Flagyl/Cipro - NPO pending clinical improvement - IV morphine PRN for pain  - Repeat CBC and CMET in AM, consider addition of lipase  FEN/GI: NPO, NS @ 125 cc/hr Prophylaxis: SQ heparin  Disposition: Med-Surg, ADAT   History of Present Illness: Haley Mclaughlin Ou is a 48 y.o. female presenting with worsening LLQ concerning for outpt failure of diverticulitis.  Pt was seen in the ED on 02/26/14 for wworsening LLQ pain, had CT abdomen/pelvis performed showing some inflammation c/w divertiulitis.  She was given Cipro/Flagyl and sent home, however, she has continued to have LLQ pain despite compliance with ABx and decreased diet.  Yesterday, she awake in the early AM with acute severe LLQ pain at which point she continued to have pain, nausea, and 3 episodes of non bloody non bilious vomiting where she was unable to keep her ABx down.    In the ED, pt had multiple evaluations performed including repeat CT abdomen, which was pending at time of admission, CMET basically normal, nml CBC, nml UA, neg Upreg.  She was started on IV Flagyl/Cipro along with one dose of dilaudid and some zofran.   Previous has had her appendix removed.  Currently denies any fever, melena, hematochezia, hematemesis, or rigors.   Review Of Systems: Per HPI   Patient Active Problem List   Diagnosis Date Noted  . Pain in right foot 07/07/2013  .  Abdominal pain, epigastric 06/05/2013  . Hyperglycemia 06/05/2013  . 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 SOB (shortness of breath) 01/31/2013  . HYPERCHOLESTEROLEMIA 10/28/2006  . PANIC ATTACKS 10/28/2006  . PALPITATIONS 10/28/2006   Past Medical History: Past Medical History  Diagnosis Date  . Hyperlipidemia   . Gestational diabetes     diet controlled with #3  . Preterm labor     with last 4 preg, del at term  . Urinary tract infection   . Abnormal Pap smear   . PID (pelvic inflammatory disease)   . Hyperlipemia   . Sarcoidosis    Past Surgical History: Past Surgical History  Procedure Laterality Date  . Appendectomy    . Induced abortion     Social History: History  Substance Use Topics  . Smoking status: Never Smoker   . Smokeless tobacco: Never Used  . Alcohol Use: No    Family History: Family History  Problem Relation Age of Onset  . Heart disease Mother   . Hypertension Mother   . Diabetes Father   . Heart disease Father   . Anesthesia problems Neg Hx    Allergies and Medications: Allergies  Allergen Reactions  . Sulfamethoxazole-Trimethoprim Rash and Other (See Comments)    "Feels shaky" also   No current facility-administered medications on file prior to encounter.   Current Outpatient Prescriptions on File Prior to Encounter  Medication Sig Dispense Refill  . acetaminophen (TYLENOL) 325 MG tablet Take 650 mg by mouth every 6 (six) hours as needed for mild pain.      .Marland Kitchen  ciprofloxacin (CIPRO) 500 MG tablet Take 1 tablet (500 mg total) by mouth 2 (two) times daily.  14 tablet  0  . HYDROcodone-acetaminophen (NORCO/VICODIN) 5-325 MG per tablet Take 1-2 tablets by mouth every 4 (four) hours as needed for moderate pain or severe pain.  20 tablet  0  . metroNIDAZOLE (FLAGYL) 500 MG tablet Take 1 tablet (500 mg total) by mouth 3 (three) times daily.  21 tablet  0  . ondansetron (ZOFRAN) 4 MG tablet Take 1 tablet (4 mg total) by mouth every 8 (eight) hours as  needed for nausea or vomiting.  20 tablet  0    Objective: BP 107/63  Pulse 82  Temp(Src) 98.2 F (36.8 C) (Oral)  Resp 14  Ht 5\' 4"  (1.626 m)  Wt 200 lb (90.719 kg)  BMI 34.31 kg/m2  SpO2 97%  LMP 02/06/2014 Exam: General: NAD HEENT: [place]/AT, no scleral icterus, Dry mucous membranes  Cardiovascular: RRR, no murmurs appreciated  Respiratory: CTAB Abdomen: TTP LLQ, no rebound/guarding/rigidity, NABS, no HSM  Extremities: no edema Skin: no rashes  Neuro: No focal deficits   Labs and Imaging: CBC BMET   Recent Labs Lab 03/01/14 2212  WBC 7.9  HGB 13.2  HCT 39.1  PLT 240    Recent Labs Lab 03/01/14 2212  NA 139  K 3.7  CL 100  CO2 26  BUN 11  CREATININE 0.76  GLUCOSE 104*  CALCIUM 9.3     CT abdomen/pelvis 6/29 - IMPRESSION:  1. Mild inflammatory changes associated with the proximal sigmoid  colon consistent with low-grade diverticulitis. There is no ileus or  obstruction or evidence of perforation.  2. There is no acute urinary tract or acute gynecological  abnormality    CT abdomen Pelvis 7/3 - Pending   Briscoe DeutscherBryan R Cecile Gillispie, DO 03/02/2014, 1:14 AM PGY-3, [facility] FPTS Intern pager: [phone], text pages welcome

## 2014-03-02 NOTE — Progress Notes (Signed)
UR completed.  Kellyanne Ellwanger, RN BSN MHA CCM Trauma/Neuro ICU Case Manager [phone]  

## 2014-03-02 NOTE — ED Provider Notes (Signed)
Medical screening examination/treatment/procedure(s) were conducted as a shared visit with non-physician practitioner(s) and myself.  I personally evaluated the patient during the encounter.   EKG Interpretation None       Doug SouSam Darlina Mccaughey, MD 03/02/14 2102

## 2014-03-03 MED ORDER — METRONIDAZOLE 500 MG PO TABS: 500.0000 mg | ORAL_TABLET | Freq: Three times a day (TID) | ORAL | Status: DC

## 2014-03-03 MED ORDER — POTASSIUM CHLORIDE CRYS ER 20 MEQ PO TBCR
30.0000 meq | EXTENDED_RELEASE_TABLET | Freq: Once | ORAL | Status: AC
  Administered 2014-03-03: 30 meq via ORAL
  Filled 2014-03-03 (×2): qty 1

## 2014-03-03 MED ORDER — IBUPROFEN 400 MG PO TABS: 400.0000 mg | ORAL_TABLET | Freq: Four times a day (QID) | ORAL | Status: DC | PRN

## 2014-03-03 MED ORDER — HYDROCODONE-ACETAMINOPHEN 5-325 MG PO TABS
1.0000 | ORAL_TABLET | ORAL | Status: DC | PRN
  Administered 2014-03-03: 1 via ORAL
  Filled 2014-03-03: qty 1

## 2014-03-03 MED ORDER — CIPROFLOXACIN HCL 500 MG PO TABS
500.0000 mg | ORAL_TABLET | Freq: Two times a day (BID) | ORAL | Status: DC
  Administered 2014-03-03: 500 mg via ORAL
  Filled 2014-03-03: qty 1

## 2014-03-03 MED ORDER — CIPROFLOXACIN HCL 500 MG PO TABS: 500.0000 mg | ORAL_TABLET | Freq: Two times a day (BID) | ORAL | Status: DC

## 2014-03-03 MED ORDER — METRONIDAZOLE 500 MG PO TABS
500.0000 mg | ORAL_TABLET | Freq: Three times a day (TID) | ORAL | Status: DC
  Administered 2014-03-03: 500 mg via ORAL
  Filled 2014-03-03: qty 1

## 2014-03-03 MED ORDER — AMOXICILLIN-POT CLAVULANATE 875-125 MG PO TABS
1.0000 | ORAL_TABLET | Freq: Two times a day (BID) | ORAL | Status: DC
  Administered 2014-03-03 – 2014-03-04 (×3): 1 via ORAL
  Filled 2014-03-03 (×4): qty 1

## 2014-03-03 NOTE — Progress Notes (Signed)
Interval update.   Pt feeling very nauseous she feels is due to PO flagyl. Will change to augmentin PO as an alternative to cipro/flagyl. After discussion with pt she would like to make sure this will be effective as to avoid bounceback. Will give first dose tonight at 5 pm and plan dc tomorrow if tolerating PO.  Murtis SinkSam Breanah Faddis, MD [facility] Resident, PGY-3 03/03/2014, 12:09 PM

## 2014-03-03 NOTE — Progress Notes (Signed)
CALL PAGER [phone] for any questions or notifications regarding this patient   FMTS Attending Daily Note: Denny LevySara Dimas Scheck MD Attending pager: [phone]  office [phone] I have discussed this patient with the resident and reviewed the assessment and plan as documented above. I agree with the resident's findings and plan.We are advancing her diet but with caution--she is pretty adamant she feels well enough to eat and possibly go home. Will see how she does today.

## 2014-03-03 NOTE — Progress Notes (Signed)
Family Medicine Teaching Service Daily Progress Note Intern Pager: [phone]  Patient name: Haley Mclaughlin Medical record number: [id number] Date of birth: 22-Aug-1966 Age: 48 y.o. Gender: female  Primary Care Provider: Carney LivingHAMBLISS,MARSHALL L, MD Consultants: none Code Status: full  Pt Overview and Major Events to Date:  6/29: Diverticulitis per CT in ED - Sent home with PO abx 7/3: Admitted due to failed outpt treatment of Diverticulitis - IV flagyl/cipro started 7/4: Transitioned to PO abx  Assessment and Plan: Haley Mclaughlin is a 48 y.o. female presenting with worsening LLQ pain concerning for Diverticulitis . PMH is significant for HLD   Diverticulitis -  - Transition to PO Flagyl/Cipro (7/4) Tabx day 6 of 10-14 (Started ED 6/29 but failed outpt therapy) - advance diet as tolerated - Pain: Ibuprofen and Norco for break through pain  FEN/GI: Reg diet, NS @ 60 cc/hr  Prophylaxis: SQ heparin  Disposition: Pending adequate PO intake to maintain hydration and keep PO meds down  Subjective:  She report improved pain 4/10 currently but 8/10 when walking to restroom. Nausea continues, but is improved form yesterday and response well to zofran. Tolerated some liquids yesterday.   Objective: Temp:  [97.2 F (36.2 C)-98.2 F (36.8 C)] 98.1 F (36.7 C) (07/04 0535) Pulse Rate:  [68-78] 68 (07/04 0535) Resp:  [16-18] 16 (07/04 0535) BP: (106-122)/(55-74) 106/55 mmHg (07/04 0535) SpO2:  [95 %-100 %] 96 % (07/04 0535) Physical Exam: General: NAD  HEENT: [place]/AT, no scleral icterus, MMM  Cardiovascular: RRR, no murmurs appreciated  Respiratory: CTAB  Abdomen: mild TTP LLQ, no rebound/guarding/rigidity, NABS, no HSM  Extremities: no edema  Skin: no rashes  Neuro: No focal deficits   Laboratory:  Recent Labs Lab 02/26/14 0225 03/01/14 2017 03/01/14 2212 03/02/14 0526  WBC 9.0  --  7.9 6.5  HGB 13.8 13.6 13.2 12.2  HCT 41.4 40.0 39.1 37.1  PLT 245  --  240 220    Recent  Labs Lab 02/26/14 0225 03/01/14 2017 03/01/14 2212 03/02/14 0526  NA 138 139 139 140  K 4.5 3.8 3.7 3.6*  CL 99 103 100 102  CO2 24  --  26 26  BUN 12 9 11 9   CREATININE 0.64 0.80 0.76 0.76  CALCIUM 9.2  --  9.3 8.3*  PROT 7.8  --  7.3 6.5  BILITOT 0.5  --  0.3 0.3  ALKPHOS 51  --  50 43  ALT 15  --  20 17  AST 22  --  24 21  GLUCOSE 109* 140* 104* 100*   Imaging/Diagnostic Tests: CT abdomen/pelvis 7/3 IMPRESSION:  Improved (essentially resolved) sigmoid diverticulitis. No abscess  or perforation  CT abdomen/pelvis 6/29 - IMPRESSION:  1. Mild inflammatory changes associated with the proximal sigmoid  colon consistent with low-grade diverticulitis. There is no ileus or  obstruction or evidence of perforation.  2. There is no acute urinary tract or acute gynecological  abnormality   Wenda LowJames Maurion Walkowiak, MD 03/03/2014, 8:35 AM PGY-2, [facility] FPTS Intern pager: [phone], text pages welcome

## 2014-03-04 MED ORDER — AMOXICILLIN-POT CLAVULANATE 875-125 MG PO TABS: 1.0000 | ORAL_TABLET | Freq: Two times a day (BID) | ORAL | Status: DC

## 2014-03-04 NOTE — Discharge Instructions (Signed)
Ms Veva HolesVazquez you were hospitalized for Diverticulitis and treated with IV antibiotics until you pain and nausea improved. You should continue taking you antibiotics (Augmentin only ) for 1 more week. After you have completed your antibiotics your primary care doctor will consider referral to a gastroenterologist for colonoscopy.   Diverticulitis Diverticulitis is when small pockets that have formed in your colon (large intestine) become infected or swollen. HOME CARE  Follow your doctor's instructions.  Follow a special diet if told by your doctor.  When you feel better, your doctor may tell you to change your diet. You may be told to eat a lot of fiber. Fruits and vegetables are good sources of fiber. Fiber makes it easier to poop (have bowel movements).  Take supplements or probiotics as told by your doctor.  Only take medicines as told by your doctor.  Keep all follow-up visits with your doctor. GET HELP IF:  Your pain does not get better.  You have a hard time eating food.  You are not pooping like normal. GET HELP RIGHT AWAY IF:  Your pain gets worse.  Your problems do not get better.  Your problems suddenly get worse.  You have a fever.  You keep throwing up (vomiting).  You have bloody or black, tarry poop (stool). MAKE SURE YOU:   Understand these instructions.  Will watch your condition.  Will get help right away if you are not doing well or get worse. Document Released: 02/03/2008 Document Revised: 08/22/2013 Document Reviewed: 07/12/2013 [facility] Patient Information 2015 [employer] [place]. This information is not intended to replace advice given to you by your health care provider. Make sure you discuss any questions you have with your health care provider.

## 2014-03-04 NOTE — Discharge Summary (Signed)
Family Medicine Teaching [facility] Discharge Summary  Patient name: Haley Mclaughlin Medical record number: [id number] Date of birth: 1966-07-07 Age: 48 y.o. Gender: female Date of Admission: 03/01/2014  Date of Discharge: 03/04/2014 Admitting Physician: Nestor RampSara L Neal, MD  Primary Care Provider: Carney LivingHAMBLISS,MARSHALL L, MD Consultants: None  Indication for Hospitalization:  Diverticulitis  Discharge Diagnoses/Problem List:  Diverticulitis  Disposition: Home  Discharge Condition: Stable  Discharge Exam:  Gen: NAD, alert, cooperative with exam, just finished breakfast HEENT: NCAT, MMM CV: RRR, good S1/S2, no murmur Resp: CTABL, no wheezes, non-labored Abd: soft, mild tenderness diffusely improved from initial presentation, no rebound  Ext: No edema, warm Neuro: Alert and oriented, No gross deficits   Brief Hospital Course:  Haley Mclaughlin is a 48 y.o. female presenting with worsening LLQ pain due to Diverticulitis. She was seen in the ED on 6/29 where CT was used to diagnose diverticulitis. She continued to have worsening LLQ pain despite compliance with PO cipro and flagyl. She was treated with IV cipro and flagyl inpatient and transitioned to PO cipro flagyl. She did not tolerate PO flagyl so she was changed to PO augmentin monotherapy. SHe tolerated this and PO food well for 18 hours + prior to dc. Her paina nd exam has improved and she has close PCP f/u in 2 days in the clinic.    Issues for Follow Up:  1. Ensure compliance with medication  Significant Procedures:  None  Significant Labs and Imaging:   Recent Labs Lab 02/26/14 0225 03/01/14 2017 03/01/14 2212 03/02/14 0526  WBC 9.0  --  7.9 6.5  HGB 13.8 13.6 13.2 12.2  HCT 41.4 40.0 39.1 37.1  PLT 245  --  240 220    Recent Labs Lab 02/26/14 0225 03/01/14 2017 03/01/14 2212 03/02/14 0526  NA 138 139 139 140  K 4.5 3.8 3.7 3.6*  CL 99 103 100 102  CO2 24  --  26 26  GLUCOSE 109* 140* 104* 100*  BUN 12 9  11 9   CREATININE 0.64 0.80 0.76 0.76  CALCIUM 9.2  --  9.3 8.3*  ALKPHOS 51  --  50 43  AST 22  --  24 21  ALT 15  --  20 17  ALBUMIN 4.2  --  3.9 3.4*    CT abd/pelv with contrast 03/02/2014 IMPRESSION:  Improved (essentially resolved) sigmoid diverticulitis. No abscess  or perforation.   Results/Tests Pending at Time of Discharge: none  Discharge Medications:    Medication List    STOP taking these medications       acetaminophen 325 MG tablet  Commonly known as:  TYLENOL     ciprofloxacin 500 MG tablet  Commonly known as:  CIPRO     doxycycline 100 MG capsule  Commonly known as:  VIBRAMYCIN     metroNIDAZOLE 500 MG tablet  Commonly known as:  FLAGYL      TAKE these medications       amoxicillin-clavulanate 875-125 MG per tablet  Commonly known as:  AUGMENTIN  Take 1 tablet by mouth 2 (two) times daily.     HYDROcodone-acetaminophen 5-325 MG per tablet  Commonly known as:  NORCO/VICODIN  Take 1-2 tablets by mouth every 4 (four) hours as needed for moderate pain or severe pain.     ibuprofen 400 MG tablet  Commonly known as:  ADVIL,MOTRIN  Take 1 tablet (400 mg total) by mouth every 6 (six) hours as needed for mild pain.  ondansetron 4 MG tablet  Commonly known as:  ZOFRAN  Take 1 tablet (4 mg total) by mouth every 8 (eight) hours as needed for nausea or vomiting.        Discharge Instructions: Please refer to Patient Instructions section of EMR for full details.  Patient was counseled important signs and symptoms that should prompt return to medical care, changes in medications, dietary instructions, activity restrictions, and follow up appointments.   Follow-Up Appointments: Follow-up Information   Follow up with Carney LivingHAMBLISS,MARSHALL L, MD. (03/06/2014 at 915)    Specialty:  Family Medicine   Contact information:   [address] [place] [address] [phone]       Elenora GammaSamuel L Bradshaw, MD 03/04/2014, 8:45 AM PGY-3, [facility]

## 2014-03-04 NOTE — Discharge Summary (Signed)
.  Family Medicine Teaching Service  Discharge Note : Attending Nykole Matos MD Pager [phone] Office [phone] I have seen and examined this patient, reviewed their chart and discussed discharge planning with the resident at the time of discharge. I agree with the discharge plan as above.  

## 2014-03-04 NOTE — Progress Notes (Signed)
Discharged via wheelchair accompanied to friend's car by NT. Trina Aoarla Rachit Grim, RN

## 2014-03-04 NOTE — Progress Notes (Signed)
Patient said she drove herself here and needed to drive herself home. Told her that is not acceptable. She called a friend to come and get her. IV removed per order. Discharge instructions given and explained. Awaiting ride. Trina Aoarla Shayleen Eppinger, RN

## 2014-03-06 ENCOUNTER — Encounter: Payer: Self-pay | Admitting: Family Medicine

## 2014-03-06 ENCOUNTER — Ambulatory Visit (INDEPENDENT_AMBULATORY_CARE_PROVIDER_SITE_OTHER): Payer: Medicaid Other | Admitting: Family Medicine

## 2014-03-06 VITALS — BP 135/83 | HR 72 | Temp 98.4°F | Ht 64.0 in | Wt 207.0 lb

## 2014-03-06 DIAGNOSIS — J8289 Other pulmonary eosinophilia, not elsewhere classified: Secondary | ICD-10-CM

## 2014-03-06 DIAGNOSIS — K5732 Diverticulitis of large intestine without perforation or abscess without bleeding: Secondary | ICD-10-CM

## 2014-03-06 DIAGNOSIS — J82 Pulmonary eosinophilia, not elsewhere classified: Secondary | ICD-10-CM

## 2014-03-06 NOTE — Assessment & Plan Note (Signed)
Improved Finish all antibiotics.  No fhx of cancer and CT not concerning so will start colon cancer screening at usual age

## 2014-03-06 NOTE — Assessment & Plan Note (Signed)
In remission.

## 2014-03-06 NOTE — Progress Notes (Signed)
   Subjective:    Patient ID: Haley Mclaughlin, female    DOB: [birth year], 48 y.o.   MRN: [id number]  HPI  Diverticulitis Follow up from recent hospitalization. Feels much better.  Taking augmentin twice daily with only slight diarrhea.  No fevers or pain or bleeding or nausea or vomiting   Loeffler Syndrome Off all medications and has no skin lesions or respiratory symtpoms   Chief Complaint noted Review of Symptoms - see HPI PMH - Smoking status noted.   Vital Signs reviewed   Review of Systems     Objective:   Physical Exam Alert no acute distress Abdomen: soft and non-tender without masses, organomegaly or hernias noted.  No guarding or rebound Extremities:  No cyanosis, edema, or deformity noted with good range of motion of all major joints.          Assessment & Plan:

## 2014-03-08 ENCOUNTER — Telehealth: Payer: Self-pay | Admitting: Family Medicine

## 2014-03-08 NOTE — Telephone Encounter (Signed)
Last night had sharp pains in right side. Getting worse and shooting pains under rib cage Please advise

## 2014-03-08 NOTE — Telephone Encounter (Signed)
Spoke with pt.  She states that she is having right sided sharp shooting pains. These are worse with sneezing, rolling over in bed and sitting straight up.  She is having nausea, but no chest pain or SOB.  Spoke with Dr. Deirdre Priesthambliss he advises to take 2 extra strength tylenol and to come see him tomorrow afternoon if she is still not feeling better.  Relayed message to pt and she is agreeable. Haley Mclaughlin, Maryjo RochesterJessica Dawn

## 2014-03-09 ENCOUNTER — Encounter: Payer: Self-pay | Admitting: Family Medicine

## 2014-03-09 ENCOUNTER — Ambulatory Visit (INDEPENDENT_AMBULATORY_CARE_PROVIDER_SITE_OTHER): Payer: Medicaid Other | Admitting: Family Medicine

## 2014-03-09 VITALS — BP 133/77 | HR 86 | Temp 98.0°F | Wt 204.0 lb

## 2014-03-09 DIAGNOSIS — R1011 Right upper quadrant pain: Secondary | ICD-10-CM | POA: Insufficient documentation

## 2014-03-09 LAB — COMPREHENSIVE METABOLIC PANEL
ALK PHOS: 40 U/L (ref 39–117)
ALT: 24 U/L (ref 0–35)
AST: 23 U/L (ref 0–37)
Albumin: 4.3 g/dL (ref 3.5–5.2)
BILIRUBIN TOTAL: 0.8 mg/dL (ref 0.2–1.2)
BUN: 9 mg/dL (ref 6–23)
CO2: 25 mEq/L (ref 19–32)
CREATININE: 0.74 mg/dL (ref 0.50–1.10)
Calcium: 9.7 mg/dL (ref 8.4–10.5)
Chloride: 103 mEq/L (ref 96–112)
Glucose, Bld: 131 mg/dL — ABNORMAL HIGH (ref 70–99)
Potassium: 4 mEq/L (ref 3.5–5.3)
Sodium: 137 mEq/L (ref 135–145)
Total Protein: 7.2 g/dL (ref 6.0–8.3)

## 2014-03-09 LAB — CBC
HEMATOCRIT: 40.3 % (ref 36.0–46.0)
Hemoglobin: 14.1 g/dL (ref 12.0–15.0)
MCH: 30.1 pg (ref 26.0–34.0)
MCHC: 35 g/dL (ref 30.0–36.0)
MCV: 86.1 fL (ref 78.0–100.0)
Platelets: 258 10*3/uL (ref 150–400)
RBC: 4.68 MIL/uL (ref 3.87–5.11)
RDW: 13.8 % (ref 11.5–15.5)
WBC: 6.5 10*3/uL (ref 4.0–10.5)

## 2014-03-09 LAB — LIPASE: Lipase: 36 U/L (ref 0–75)

## 2014-03-09 MED ORDER — OXYCODONE-ACETAMINOPHEN 5-325 MG PO TABS: 1.0000 | ORAL_TABLET | Freq: Three times a day (TID) | ORAL | Status: DC | PRN

## 2014-03-09 NOTE — Assessment & Plan Note (Signed)
The most likely diagnosis is acute choleycystitis without signs of infection (fever)  Could have pancreatitis.  Not likely to have pneumonia or renal stone or pulmonary emboli .   I have also considered and concluded the patient has a very low likelihood of having an Ectopic pregnancy: Pelvic inflammatory disease:  Kidney stone: Myocardial infarction: Pneumonia: Aortic dissection: Mesenteric ischemia: Black Widow Spider bite,   Check labs and US and treat for pain and nausea

## 2014-03-09 NOTE — Patient Instructions (Signed)
Abdominal Pain  Treatment - you should:take the percocet as needed and take zofran for nausea  Mainly drink fluids - gatorade, soda etc  You should be better in: 2-4 days   Call us o[place] go to the ER if you have severe pain or bleeding or high fever or cant keep liquids down   I will call you with the results of the US or if the blood test are abnormal

## 2014-03-09 NOTE — Progress Notes (Signed)
   Subjective:    Patient ID: Haley Mclaughlin, female    DOB: Nov 19, 1965, 48 y.o.   MRN: [id number]  HPI  ABDOMINAL PAIN  Pain began suddenly at night 2 days ago in her R abdomen is constant goes thru to back sometimes Pain is not pleuritic.  No shortness of breath  Medications tried: tylenol - not help Similar pain before:long ago when pregnant told could be gall bladder Prior abdominal surgeries: no  Symptoms Nausea/vomiting: yes twice this AM.  Has drank liquids since Diarrhea: no Constipation: no Blood in stool: no Blood in vomit: no Fever: no Dysuria: no  No hematuria Loss of appetite: nauseas Weight loss: no   Review of Symptoms - see HPI PMH - Smoking status noted.    Chief Complaint noted Review of Symptoms - see HPI PMH - Smoking status noted.   Recently treated for diverticulitis still taking antibiotics  Vital Signs reviewed  Review of Systems     Objective:   Physical Exam  Mild distress moving restlessly MM moist Abdomen - focally tender in RUQ  +/- murphy sign.  No epigastric or pain elsehwhere in abdomen.  No guarding or rebound or masses No cvat tenderness No back tenderness No rash in RUQ Lungs:  Normal respiratory effort, chest expands symmetrically. Lungs are clear to auscultation, no crackles or wheezes.       Assessment & Plan:

## 2014-03-12 ENCOUNTER — Ambulatory Visit
Admission: RE | Admit: 2014-03-12 | Discharge: 2014-03-12 | Disposition: A | Discharge status: Home / Self Care | Payer: Medicaid Other | Source: Ambulatory Visit | Attending: Family Medicine | Admitting: Family Medicine

## 2014-03-12 ENCOUNTER — Telehealth: Payer: Self-pay | Admitting: Family Medicine

## 2014-03-12 DIAGNOSIS — R1011 Right upper quadrant pain: Secondary | ICD-10-CM

## 2014-03-12 NOTE — Telephone Encounter (Addendum)
Called Mobile - no answer  She called back  Still have occasional abdomen pain then relieves.  No vomiting or fever or bleeding  Told all labs and us ok  Asked if not better in a few days or if any of above to call us  She agerees

## 2014-03-14 ENCOUNTER — Ambulatory Visit (HOSPITAL_COMMUNITY): Payer: Medicaid Other

## 2014-04-20 ENCOUNTER — Ambulatory Visit: Payer: Medicaid Other | Admitting: Family Medicine

## 2014-07-10 ENCOUNTER — Emergency Department: Payer: Self-pay | Admitting: Emergency Medicine

## 2014-07-10 LAB — COMPREHENSIVE METABOLIC PANEL
ALK PHOS: 66 U/L
ANION GAP: 8 (ref 7–16)
AST: 29 U/L (ref 15–37)
Albumin: 3.8 g/dL (ref 3.4–5.0)
BUN: 8 mg/dL (ref 7–18)
Bilirubin,Total: 0.5 mg/dL (ref 0.2–1.0)
CHLORIDE: 106 mmol/L (ref 98–107)
CO2: 26 mmol/L (ref 21–32)
CREATININE: 0.49 mg/dL — AB (ref 0.60–1.30)
Calcium, Total: 8.4 mg/dL — ABNORMAL LOW (ref 8.5–10.1)
EGFR (Non-African Amer.): >60
Glucose: 93 mg/dL (ref 65–99)
OSMOLALITY: 277 (ref 275–301)
POTASSIUM: 3.8 mmol/L (ref 3.5–5.1)
SGPT (ALT): 25 U/L
Sodium: 140 mmol/L (ref 136–145)
Total Protein: 8 g/dL (ref 6.4–8.2)

## 2014-07-10 LAB — CBC WITH DIFFERENTIAL/PLATELET
Basophil #: 0.1 10*3/uL (ref 0.0–0.1)
Basophil %: 1.2 %
EOS ABS: 0.3 10*3/uL (ref 0.0–0.7)
Eosinophil %: 3 %
HCT: 43 % (ref 35.0–47.0)
HGB: 14.2 g/dL (ref 12.0–16.0)
LYMPHS PCT: 46.6 %
Lymphocyte #: 4.3 10*3/uL — ABNORMAL HIGH (ref 1.0–3.6)
MCH: 29.7 pg (ref 26.0–34.0)
MCHC: 32.9 g/dL (ref 32.0–36.0)
MCV: 90 fL (ref 80–100)
Monocyte #: 0.6 x10 3/mm (ref 0.2–0.9)
Monocyte %: 6.4 %
Neutrophil #: 4 10*3/uL (ref 1.4–6.5)
Neutrophil %: 42.8 %
PLATELETS: 262 10*3/uL (ref 150–440)
RBC: 4.78 10*6/uL (ref 3.80–5.20)
RDW: 13.1 % (ref 11.5–14.5)
WBC: 9.3 10*3/uL (ref 3.6–11.0)

## 2014-07-10 LAB — URINALYSIS, COMPLETE
BILIRUBIN, UR: NEGATIVE
Blood: NEGATIVE
Glucose,UR: NEGATIVE mg/dL (ref 0–75)
Ketone: NEGATIVE
Nitrite: NEGATIVE
PH: 7 (ref 4.5–8.0)
Protein: NEGATIVE
SPECIFIC GRAVITY: 1.016 (ref 1.003–1.030)
Squamous Epithelial: 17
WBC UR: 10 /HPF (ref 0–5)

## 2014-07-10 LAB — TROPONIN I: Troponin-I: <0.02 ng/mL

## 2014-08-02 ENCOUNTER — Emergency Department (HOSPITAL_COMMUNITY)
Admission: EM | Admit: 2014-08-02 | Discharge: 2014-08-02 | Disposition: A | Discharge status: Home / Self Care | Payer: Medicaid Other | Attending: Emergency Medicine | Admitting: Emergency Medicine

## 2014-08-02 ENCOUNTER — Encounter (HOSPITAL_COMMUNITY): Payer: Self-pay | Admitting: *Deleted

## 2014-08-02 ENCOUNTER — Emergency Department (HOSPITAL_COMMUNITY): Payer: Medicaid Other

## 2014-08-02 DIAGNOSIS — Y9289 Other specified places as the place of occurrence of the external cause: Secondary | ICD-10-CM | POA: Insufficient documentation

## 2014-08-02 DIAGNOSIS — Z87440 Personal history of urinary (tract) infections: Secondary | ICD-10-CM | POA: Insufficient documentation

## 2014-08-02 DIAGNOSIS — Z8632 Personal history of gestational diabetes: Secondary | ICD-10-CM | POA: Diagnosis not present

## 2014-08-02 DIAGNOSIS — Y9389 Activity, other specified: Secondary | ICD-10-CM | POA: Diagnosis not present

## 2014-08-02 DIAGNOSIS — Z8639 Personal history of other endocrine, nutritional and metabolic disease: Secondary | ICD-10-CM | POA: Insufficient documentation

## 2014-08-02 DIAGNOSIS — X58XXXA Exposure to other specified factors, initial encounter: Secondary | ICD-10-CM | POA: Insufficient documentation

## 2014-08-02 DIAGNOSIS — Z862 Personal history of diseases of the blood and blood-forming organs and certain disorders involving the immune mechanism: Secondary | ICD-10-CM | POA: Diagnosis not present

## 2014-08-02 DIAGNOSIS — S034XXA Sprain of jaw, initial encounter: Secondary | ICD-10-CM | POA: Diagnosis not present

## 2014-08-02 DIAGNOSIS — Y998 Other external cause status: Secondary | ICD-10-CM | POA: Diagnosis not present

## 2014-08-02 DIAGNOSIS — S0340XA Sprain of jaw, unspecified side, initial encounter: Secondary | ICD-10-CM

## 2014-08-02 DIAGNOSIS — Z8742 Personal history of other diseases of the female genital tract: Secondary | ICD-10-CM | POA: Diagnosis not present

## 2014-08-02 DIAGNOSIS — R6884 Jaw pain: Secondary | ICD-10-CM | POA: Diagnosis present

## 2014-08-02 MED ORDER — NAPROXEN 500 MG PO TABS: 500.0000 mg | ORAL_TABLET | Freq: Two times a day (BID) | ORAL | Status: DC

## 2014-08-02 MED ORDER — KETOROLAC TROMETHAMINE 60 MG/2ML IM SOLN
60.0000 mg | Freq: Once | INTRAMUSCULAR | Status: AC
  Administered 2014-08-02: 60 mg via INTRAMUSCULAR
  Filled 2014-08-02: qty 2

## 2014-08-02 MED ORDER — PENICILLIN V POTASSIUM 500 MG PO TABS: 500.0000 mg | ORAL_TABLET | Freq: Four times a day (QID) | ORAL | Status: AC

## 2014-08-02 MED ORDER — OXYCODONE-ACETAMINOPHEN 5-325 MG PO TABS: 1.0000 | ORAL_TABLET | ORAL | Status: DC | PRN

## 2014-08-02 NOTE — Discharge Instructions (Signed)
Take naproxen for pain. Percocet for severe pain only. Try some ice, warm compresses. Penicillin until all gone for possible infection. Follow-up with her primary care doctor and with a dentist for recheck. Return if her symptoms are worsening  Temporomandibular Problems  Temporomandibular joint (TMJ) dysfunction means there are problems with the joint between your jaw and your skull. This is a joint lined by cartilage like other joints in your body but also has a small disc in the joint which keeps the bones from rubbing on each other. These joints are like other joints and can get inflamed (sore) from arthritis and other problems. When this joint gets sore, it can cause headaches and pain in the jaw and the face. CAUSES  Usually the arthritic types of problems are caused by soreness in the joint. Soreness in the joint can also be caused by overuse. This may come from grinding your teeth. It may also come from mis-alignment in the joint. DIAGNOSIS Diagnosis of this condition can often be made by history and exam. Sometimes your caregiver may need X-rays or an MRI scan to determine the exact cause. It may be necessary to see your dentist to determine if your teeth and jaws are lined up correctly. TREATMENT  Most of the time this problem is not serious; however, sometimes it can persist (become chronic). When this happens medications that will cut down on inflammation (soreness) help. Sometimes a shot of cortisone into the joint will be helpful. If your teeth are not aligned it may help for your dentist to make a splint for your mouth that can help this problem. If no physical problems can be found, the problem may come from tension. If tension is found to be the cause, biofeedback or relaxation techniques may be helpful. HOME CARE INSTRUCTIONS   Later in the day, applications of ice packs may be helpful. Ice can be used in a plastic bag with a towel around it to prevent frostbite to skin. This may be  used about every 2 hours for 20 to 30 minutes, as needed while awake, or as directed by your caregiver.  Only take over-the-counter or prescription medicines for pain, discomfort, or fever as directed by your caregiver.  If physical therapy was prescribed, follow your caregiver's directions.  Wear mouth appliances as directed if they were given. Document Released: 05/12/2001 Document Revised: 11/09/2011 Document Reviewed: 08/19/2008 [facility] Patient Information 2015 [place]. This information is not intended to replace advice given to you by your health care provider. Make sure you discuss any questions you have with your health care provider.

## 2014-08-02 NOTE — ED Notes (Signed)
Pt in c/o left jaw pain and swelling, states this has been going on for two days, tender to palpation, pain worse when trying to eat or chew, unsure of toothache

## 2014-08-02 NOTE — ED Provider Notes (Signed)
CSN: [id number]     Arrival date & time 08/02/14  1919 History  This chart was scribed for non-physician practitioner, Jaynie Crumbleatyana Rayyan Orsborn, PA-C, working with Geoffery Lyonsouglas Delo, MD, by Bronson CurbJacqueline Melvin, ED Scribe. This patient was seen in room TR04C/TR04C and the patient's care was started at 8:24 PM.     Chief Complaint  Patient presents with  . Jaw Pain    The history is provided by the patient. No language interpreter was used.     HPI Comments: Haley Mclaughlin is a 48 y.o. female who presents to the Emergency Department complaining of intermittent left jaw pain for the past week that has become constant in last 2 days. Patient is tearful and reports pain with movement of the left jaw. She denies any injury or trauma to the left jaw. She reports fever of 102.1 F (triage temp 98.5 F) onset 3 days ago, but she reports sick contact with her children. Patient notes the pain is worse when eating and chewing. Patient has tried Tramadol and OTC pain relievers without significant improvement. She denies history of the prior episodes. Patient also denies dental pain.     Past Medical History  Diagnosis Date  . Hyperlipidemia   . Gestational diabetes     diet controlled with #3  . Preterm labor     with last 4 preg, del at term  . Urinary tract infection   . Abnormal Pap smear   . PID (pelvic inflammatory disease)   . Hyperlipemia   . Sarcoidosis    Past Surgical History  Procedure Laterality Date  . Appendectomy    . Induced abortion     Family History  Problem Relation Age of Onset  . Heart disease Mother   . Hypertension Mother   . Diabetes Father   . Heart disease Father   . Anesthesia problems Neg Hx    History  Substance Use Topics  . Smoking status: Never Smoker   . Smokeless tobacco: Never Used  . Alcohol Use: No   OB History    Gravida Para Term Preterm AB TAB SAB Ectopic Multiple Living   8 6 6  0 2 1 1  0 0 6     Review of Systems  Constitutional: Positive for fever  (resolved).  HENT: Positive for facial swelling. Negative for dental problem.        Left jaw pain      Allergies  Sulfamethoxazole-trimethoprim  Home Medications   Prior to Admission medications   Medication Sig Start Date End Date Taking? Authorizing Provider  ibuprofen (ADVIL,MOTRIN) 400 MG tablet Take 1 tablet (400 mg total) by mouth every 6 (six) hours as needed for mild pain. 03/03/14  Yes Jamal CollinJames R Joyner, MD  amoxicillin-clavulanate (AUGMENTIN) 875-125 MG per tablet Take 1 tablet by mouth 2 (two) times daily. Patient not taking: Reported on 08/02/2014 03/04/14   Elenora GammaSamuel L Bradshaw, MD  ondansetron (ZOFRAN) 4 MG tablet Take 1 tablet (4 mg total) by mouth every 8 (eight) hours as needed for nausea or vomiting. Patient not taking: Reported on 08/02/2014 02/26/14   Trixie DredgeEmily West, PA-C  oxyCODONE-acetaminophen (ROXICET) 5-325 MG per tablet Take 1 tablet by mouth every 8 (eight) hours as needed for severe pain. Patient not taking: Reported on 08/02/2014 03/09/14   Carney LivingMarshall L Chambliss, MD   Triage Vitals: BP 148/77 mmHg  Pulse 71  Temp(Src) 98.5 F (36.9 C) (Oral)  Resp 20  Ht 5\' 4"  (1.626 m)  Wt 195 lb (88.451 kg)  BMI 33.46 kg/m2  SpO2 100%  Physical Exam  Constitutional: She is oriented to person, place, and time. She appears well-developed and well-nourished. No distress.  HENT:  Head: Normocephalic and atraumatic.  Teeth with multiple caries, however nontender to the left upper jaw. Left lower molars absent. Jaw appears to be normal. Oropharynx normal. Left TM and ear canal normal. There is mild swelling noted over left TMJ. Tender to palpation. Trismus, only unable to open her mouth to finger widths. Pain in the left TMJ with clenching her teeth. No swelling in the tongue  Eyes: Conjunctivae and EOM are normal.  Neck: Neck supple. No tracheal deviation present.  Cardiovascular: Normal rate.   Pulmonary/Chest: Effort normal. No respiratory distress.  Musculoskeletal: Normal range of  motion.  Neurological: She is alert and oriented to person, place, and time.  Skin: Skin is warm and dry.  Psychiatric: She has a normal mood and affect. Her behavior is normal.  Nursing note and vitals reviewed.   ED Course  Procedures (including critical care time)  DIAGNOSTIC STUDIES: Oxygen Saturation is 100% on room air, normal by my interpretation.    COORDINATION OF CARE: At 2028 Discussed treatment plan with patient which includes imaging and pain medication. Patient agrees.   Labs Review Labs Reviewed - No data to display  Imaging Review Dg Orthopantogram  08/02/2014   CLINICAL DATA:  Job pain for 1 week  EXAM: ORTHOPANTOGRAM/PANORAMIC  COMPARISON:  None.  FINDINGS: There is no fracture or dislocation. There are multiple missing teeth. There is no lytic or blastic osseous lesion.  IMPRESSION: Normal temporomandibular joints.   Electronically Signed   By: Elige KoHetal  Patel   On: 08/02/2014 22:04     EKG Interpretation None      MDM   Final diagnoses:  TMJ (sprain of temporomandibular joint), initial encounter    Patient is here with left jaw pain, no evidence of infection at this time. She started having tenderness over left TMJ, pain with gentle range of motion. I'll get x-rays, patient drove herself here, we'll try Toradol for pain.   10:32 PM Minimal relief of pain with Toradol. Panorex x-ray is negative. Patient appears to be in severe pain, crying. Will discharge home with pain medications, naproxen, Percocet, penicillin for possible early dental infection given she does have some dental cavities. She is instructed to follow with primary care doctor and dentist. She is afebrile, nontoxic appearing. No swelling under the tongue, no trismus, no signs of Ludwig's angina, no difficulty breathing.  Filed Vitals:   08/02/14 1941  BP: 148/77  Pulse: 71  Temp: 98.5 F (36.9 C)  TempSrc: Oral  Resp: 20  Height: 5\' 4"  (1.626 m)  Weight: 195 lb (88.451 kg)  SpO2: 100%    I personally performed the services described in this documentation, which was scribed in my presence. The recorded information has been reviewed and is accurate.    Lottie Musselatyana A Jaxton Casale, PA-C 08/02/14 2234  Geoffery Lyonsouglas Delo, MD 08/02/14 [phone]

## 2014-12-16 ENCOUNTER — Emergency Department
Admit: 2014-12-16 | Disposition: A | Discharge status: Home / Self Care | Payer: Self-pay | Admitting: Emergency Medicine

## 2014-12-16 LAB — CBC
HCT: 39.2 % (ref 35.0–47.0)
HGB: 13.1 g/dL (ref 12.0–16.0)
MCH: 29.4 pg (ref 26.0–34.0)
MCHC: 33.4 g/dL (ref 32.0–36.0)
MCV: 88 fL (ref 80–100)
Platelet: 277 10*3/uL (ref 150–440)
RBC: 4.46 10*6/uL (ref 3.80–5.20)
RDW: 13 % (ref 11.5–14.5)
WBC: 8.1 10*3/uL (ref 3.6–11.0)

## 2014-12-17 ENCOUNTER — Other Ambulatory Visit: Payer: Self-pay | Admitting: Family Medicine

## 2014-12-17 DIAGNOSIS — Z1231 Encounter for screening mammogram for malignant neoplasm of breast: Secondary | ICD-10-CM

## 2014-12-24 ENCOUNTER — Encounter: Payer: Self-pay | Admitting: Family Medicine

## 2014-12-24 ENCOUNTER — Ambulatory Visit (INDEPENDENT_AMBULATORY_CARE_PROVIDER_SITE_OTHER): Payer: Medicaid Other | Admitting: Family Medicine

## 2014-12-24 VITALS — BP 140/66 | HR 79 | Temp 98.2°F | Ht 64.0 in | Wt 210.2 lb

## 2014-12-24 DIAGNOSIS — B86 Scabies: Secondary | ICD-10-CM

## 2014-12-24 MED ORDER — PERMETHRIN 5 % EX CREA: 1.0000 "application " | TOPICAL_CREAM | Freq: Once | CUTANEOUS | Status: DC

## 2014-12-24 NOTE — Patient Instructions (Signed)

## 2014-12-25 ENCOUNTER — Ambulatory Visit (HOSPITAL_COMMUNITY)
Admission: RE | Admit: 2014-12-25 | Discharge: 2014-12-25 | Disposition: A | Discharge status: Home / Self Care | Payer: Medicaid Other | Source: Ambulatory Visit | Attending: Family Medicine | Admitting: Family Medicine

## 2014-12-25 DIAGNOSIS — Z1231 Encounter for screening mammogram for malignant neoplasm of breast: Secondary | ICD-10-CM | POA: Insufficient documentation

## 2015-01-09 ENCOUNTER — Ambulatory Visit (INDEPENDENT_AMBULATORY_CARE_PROVIDER_SITE_OTHER): Payer: Medicaid Other | Admitting: Family Medicine

## 2015-01-09 ENCOUNTER — Other Ambulatory Visit (HOSPITAL_COMMUNITY)
Admission: RE | Admit: 2015-01-09 | Discharge: 2015-01-09 | Disposition: A | Discharge status: Home / Self Care | Payer: Medicaid Other | Source: Ambulatory Visit | Attending: Family Medicine | Admitting: Family Medicine

## 2015-01-09 ENCOUNTER — Encounter: Payer: Self-pay | Admitting: Family Medicine

## 2015-01-09 VITALS — BP 124/65 | HR 76 | Temp 98.1°F | Ht 64.0 in | Wt 211.3 lb

## 2015-01-09 DIAGNOSIS — J82 Pulmonary eosinophilia, not elsewhere classified: Secondary | ICD-10-CM

## 2015-01-09 DIAGNOSIS — Z01419 Encounter for gynecological examination (general) (routine) without abnormal findings: Secondary | ICD-10-CM | POA: Insufficient documentation

## 2015-01-09 DIAGNOSIS — E7800 Pure hypercholesterolemia, unspecified: Secondary | ICD-10-CM

## 2015-01-09 DIAGNOSIS — Z1151 Encounter for screening for human papillomavirus (HPV): Secondary | ICD-10-CM | POA: Insufficient documentation

## 2015-01-09 DIAGNOSIS — Z0000 Encounter for general adult medical examination without abnormal findings: Secondary | ICD-10-CM

## 2015-01-09 DIAGNOSIS — Z124 Encounter for screening for malignant neoplasm of cervix: Secondary | ICD-10-CM

## 2015-01-09 DIAGNOSIS — R87810 Cervical high risk human papillomavirus (HPV) DNA test positive: Secondary | ICD-10-CM | POA: Insufficient documentation

## 2015-01-09 DIAGNOSIS — J8289 Other pulmonary eosinophilia, not elsewhere classified: Secondary | ICD-10-CM

## 2015-01-09 LAB — LIPID PANEL
CHOL/HDL RATIO: 9.9 ratio
CHOLESTEROL: 345 mg/dL — AB (ref 0–200)
HDL: 35 mg/dL — ABNORMAL LOW (ref >=46)
LDL Cholesterol: 282 mg/dL — ABNORMAL HIGH (ref 0–99)
Triglycerides: 142 mg/dL (ref <150)
VLDL: 28 mg/dL (ref 0–40)

## 2015-01-09 NOTE — Patient Instructions (Addendum)
Good to see you today!  Thanks for coming in.  Come in fasting sometime to check your cholesterol  - no food for 8 hours  Regular exercise for about 20 minutes each day  I [facility] normal

## 2015-01-09 NOTE — Progress Notes (Signed)
   Subjective:    Patient ID: Haley Mclaughlin, female    DOB: [birth year], 49 y.o.   MRN: [id number]  HPI  [facility] several problems including cough, reflux, joint pain.  No active complaints now  Patient reports no  vision/ hearing changes,anorexia, weight change, fever ,adenopathy, persistant / recurrent hoarseness, swallowing issues, chest pain, edema,, hemoptysis, dyspnea(rest, exertional, paroxysmal nocturnal), gastrointestinal  bleeding (melena, rectal bleeding), abdominal pain, syncope, focal weakness, severe memory loss, concerning skin lesions, depression, anxiety, abnormal bruising/bleeding, major joint swelling, breast masses or abnormal vaginal bleeding.    Chief Complaint noted Review of Symptoms - see HPI PMH - Smoking status noted.   Vital Signs reviewed   Review of Systems     Objective:   Physical Exam Alert no acute distress Heart - Regular rate and rhythm.  No murmurs, gallops or rubs.    Lungs:  Normal respiratory effort, chest expands symmetrically. Lungs are clear to auscultation, no crackles or wheezes. Abdomen: soft and non-tender without masses, organomegaly or hernias noted.  No guarding or rebound Genitalia:  Normal introitus for age, no external lesions, no vaginal discharge, mucosa pink and moist, no vaginal or cervical lesions, no vaginal atrophy, no friaility or hemorrhage, normal uterus size and position, no adnexal masses or tenderness.  Limited biman exam due to habitus        Assessment & Plan:   Normal exam

## 2015-01-09 NOTE — Assessment & Plan Note (Signed)
Will check labs.  No longer on a statin

## 2015-01-09 NOTE — Assessment & Plan Note (Signed)
Being [facility]

## 2015-01-10 ENCOUNTER — Encounter: Payer: Self-pay | Admitting: Family Medicine

## 2015-01-10 DIAGNOSIS — R87618 Other abnormal cytological findings on specimens from cervix uteri: Secondary | ICD-10-CM

## 2015-01-10 LAB — CYTOLOGY - PAP

## 2015-01-16 NOTE — Progress Notes (Signed)
   Subjective:    Patient ID: Haley Mclaughlin, female    DOB: 09-Feb-1966, 49 y.o.   MRN: [id number]  HPI  Here with complaints of rash, pruritic.  After discussion of rash and differential pt reports her family had scabies within past year.   Review of Systems  Constitutional: Negative for fever, chills, diaphoresis and fatigue.  Skin: Positive for rash. Negative for color change, pallor and wound.      Objective:   Physical Exam  Constitutional: She appears well-developed and well-nourished. No distress.  HENT:  Mouth/Throat: Mucous membranes are moist. Pharynx is normal.  Eyes: Conjunctivae and EOM are normal.  Neck: No adenopathy.  Cardiovascular: Normal rate and S2 normal.   Pulmonary/Chest: Effort normal and breath sounds normal.  Abdominal: She exhibits no distension. There is no tenderness.  Musculoskeletal: Normal range of motion.  Neurological: She is alert. No cranial nerve deficit. Coordination normal.  Skin: Skin is warm. No rash noted. She is not diaphoretic. No pallor.  Papular rash on arms, none on intertriginous regions, tracking characteristic of scabies noted multiple areas.       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  At this time symptoms and hx of family with scabies will tx as scabies - pt unhappy with diagnosis wanting to know how long she has had scabies for, where she got it from.  Perry MountACOSTA,Haddon Fyfe ROCIO, MD 10:21 AM

## 2015-01-31 DIAGNOSIS — R87619 Unspecified abnormal cytological findings in specimens from cervix uteri: Secondary | ICD-10-CM | POA: Insufficient documentation

## 2015-02-05 ENCOUNTER — Ambulatory Visit: Payer: Medicaid Other | Admitting: Physical Therapy

## 2015-02-08 ENCOUNTER — Encounter: Payer: Self-pay | Admitting: Physical Therapy

## 2015-02-08 ENCOUNTER — Ambulatory Visit: Payer: Medicaid Other | Attending: Obstetrics and Gynecology | Admitting: Physical Therapy

## 2015-02-08 DIAGNOSIS — M629 Disorder of muscle, unspecified: Secondary | ICD-10-CM | POA: Diagnosis present

## 2015-02-08 DIAGNOSIS — N8189 Other female genital prolapse: Secondary | ICD-10-CM

## 2015-02-08 NOTE — Patient Instructions (Addendum)
1) Benefits of taking probiotics to restore gut flora to address poor stool formation and prior use of anitbiotics 2) Nutritional services at [facility] to address Hx of diverticulitis, high cholesterol, boosting immune system for Sarcoidosis. Medical Nutrition Therapy Address: [address] [place], [place], [place] [address]  [phone]   3) PELVIC FLOOR / KEGEL EXERCISES   Pelvic floor/ Kegel exercises are used to strengthen the muscles in the base of your pelvis that are responsible for supporting your pelvic organs and preventing urine/feces leakage. Based on your therapist's recommendations, they can be performed while standing, sitting, or lying down. Imagine pelvic floor area as a diamond with pelvic landmarks: top =pubic bone, bottom tip=tailbone, sides=sitting bones (ischial tuberosities).    Make yourself aware of this muscle group by using these cues while coordinating your breath:  Inhale, feel pelvic floor diamond area lower like hammock towards your feet and ribcage/belly expanding. Pause. Let the exhale naturally and feel your belly sink, abdominal muscles hugging in around you and you may notice the pelvic diamond draws upward towards your head forming a umbrella shape. Give a squeeze during the exhalation like you are stopping the flow of urine. If you are squeezing the buttock muscles, try to give 50% less effort.   Common Errors:  Breath holding: If you are holding your breath, you may be bearing down against your bladder instead of pulling it up. If you belly bulges up while you are squeezing, you are holding your breath. Be sure to breathe gently in and out while exercising. Counting out loud may help you avoid holding your breath.  Accessory muscle use: You should not see or feel other muscle movement when performing pelvic floor exercises. When done properly, no one can tell that you are performing the exercises. Keep the buttocks, belly and inner thighs  relaxed.  Overdoing it: Your muscles can fatigue and stop working for you if you over-exercise. You may actually leak more or feel soreness at the lower abdomen or rectum.  YOUR HOME EXERCISE PROGRAM  LONG HOLDS: Position: on back  Inhale and then exhale. Then squeeze the muscle and count aloud for 10 seconds. Rest with three long breaths. (Be sure to let belly sink in with exhales and not push outward)  Perform 2 repetitions, 3-5 times/day                    DECREASE DOWNWARD PRESSURE ON  YOUR PELVIC FLOOR, ABDOMINAL, LOW BACK MUSCLES       PRESERVE YOUR PELVIC HEALTH LONG-TERM   ** SQUEEZE pelvic floor BEFORE YOUR SNEEZE, COUGH, LAUGH   ** EXHALE BEFORE YOU RISE AGAINST GRAVITY (lifting, sit to stand, from squat to stand)   ** LOG ROLL OUT OF BED INSTEAD OF CRUNCH/SIT-UP   4) Toilet                                                5) Baby Bod by Hillis Range             6) Scar massage over R perineal scar (using Vitamin E or olive/coconut oil)  after each shower (nails are clean), (3-5 min for 1-2 weeks or until you no longer feel the ridge "shirt hem" texture)

## 2015-02-09 NOTE — Therapy (Signed)
[facility] MAIN [facility] SERVICES [address] [place], [place], [address] Phone: [phone]   Fax:  [phone]  Physical Therapy Evaluation  Patient Details  Name: Haley Mclaughlin MRN: [id number] Date of Birth: 05/03/1966 Referring Provider:  Joylene John, MD  Encounter Date: 02/08/2015      PT End of Session - 02/09/15 1841    Visit Number 1   Number of Visits 1   Authorization Type Medicaid coverage is limited to evaluation only    PT Start Time 0904   PT Stop Time 1030   PT Time Calculation (min) 86 min   Activity Tolerance Patient tolerated treatment well   Behavior During Therapy [facility] for tasks assessed/performed      Past Medical History  Diagnosis Date  . Hyperlipidemia   . Gestational diabetes     diet controlled with #3  . Preterm labor     with last 4 preg, del at term  . Urinary tract infection   . Abnormal Pap smear   . PID (pelvic inflammatory disease)   . Hyperlipemia   . Sarcoidosis     Past Surgical History  Procedure Laterality Date  . Appendectomy    . Induced abortion      There were no vitals filed for this visit.  Visit Diagnosis:  Fascial defect - Plan: PT plan of care cert/re-cert  Pelvic floor weakness - Plan: PT plan of care cert/re-cert          [facility] PT Assessment - 02/09/15 1834    Assessment   Medical Diagnosis urinary incontinence   Precautions   Precautions None   Restrictions   Weight Bearing Restrictions No   Balance Screen   Has the patient fallen in the past 6 months No   Prior Function   Level of Independence Independent   Observation/Other Assessments   Other Surveys  --  PFDI: 62% (lower % indicate improved function)    Coordination   Gross Motor Movements are Fluid and Coordinated Yes  ALSR w/ lumbopelvic instability   Fine Motor Movements are Fluid and Coordinated Not tested   Posture/Postural Control   Posture/Postural Control Postural limitations   Posture  Comments Abdominal straining with pelvic floor lowering with cue for bowel movement and coughing   Palpation   SI assessment  no asymmetries noted   Palpation comment limited scar mobility over abdominal scars   Bed Mobility   Bed Mobility Supine to Sit  crunch technique, able to log roll after cuing and education                 Pelvic Floor Special Questions - 02/09/15 1838    Diastasis Recti neg   Prolapse None   Exam Type Vaginal   Palpation noted R perineal scar limiting ability to contract pelvic floor circumferential  pre-Tx: pelvic floor contraction anterior > posterior.    Strength fair squeeze, definite lift  post-Tx: more circumferential,decreased tenderness to scar   Strength # of reps 2   Strength # of seconds 10                    PT Short Term Goals - 02/09/15 1848    PT SHORT TERM GOAL #1   Title Pt will be able to demo proper coordination of pelvic floor muscles (diaphragmatic breathing w/ pelvic floor ROM) and minimize further risks for pelvic floor dysfunction by log rolling and demo proper toileting posture.    Time 1  Period Days   Status Achieved   PT SHORT TERM GOAL #2   Title Pt will be provided resources to further her HEP (book and nutrition services) and self-care.    Time 1   Period Days   Status Achieved           PT Long Term Goals - 02/08/15 0931    PT LONG TERM GOAL #1   Title --   PT LONG TERM GOAL #2   Title --   Time --   Period --   Status --   PT LONG TERM GOAL #3   Title --   Time --   Period --   Status --   PT LONG TERM GOAL #4   Title --   Time --   Period --   Status --   PT LONG TERM GOAL #5   Title --   Time --   Period --   Status --               Plan - 02/09/15 1842    Clinical Impression Statement Pt is a 49 yo pt whose S & Sx consist of limited perineal scar mobility, weakness and coordination of pelvic floor and other deep core mm, poor toileting posture and habits. These  deficits limit her ability to participate in her ADLs such as urination, defection, and sexual intercourse. Pt was able to perform pelvic floor exercises with scar massage, coordination training, and ways to decrease further downward pressures on the pelvic floor muscles. Pt was provided further resources for nutrition, a book for progressing deep core muscle strength, and  posture modifications. Pt is unable to afford further PT sessions out of pocket due to limited coverage by Medicaid for evaluation only.     Pt will benefit from skilled therapeutic intervention in order to improve on the following deficits Decreased coordination;Decreased endurance;Decreased activity tolerance;Decreased strength;Decreased scar mobility;Decreased safety awareness;Increased fascial restrictions;Decreased range of motion;Obesity   Rehab Potential Good   Clinical Impairments Affecting Rehab Potential Access to further PT sessions due to Medicaid coverage for evaluation only, comorbidities         Problem List Patient Active Problem List   Diagnosis Date Noted  . Abnormal Pap smear of cervix 01/31/2015  . Pain in right foot 07/07/2013  . Hyperglycemia 06/05/2013  . 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 HYPERCHOLESTEROLEMIA 10/28/2006  . PANIC ATTACKS 10/28/2006  . PALPITATIONS 10/28/2006    Mariane Masters ,PT, DPT, E-RYT  02/09/2015, 7:03 PM  [facility] MAIN [facility] SERVICES [address] [place], [place], [address] Phone: [phone]   Fax:  [phone]

## 2015-02-27 ENCOUNTER — Encounter: Payer: Self-pay | Admitting: Emergency Medicine

## 2015-02-27 ENCOUNTER — Emergency Department
Admission: EM | Admit: 2015-02-27 | Discharge: 2015-02-27 | Disposition: A | Discharge status: Home / Self Care | Payer: Medicaid Other | Attending: Emergency Medicine | Admitting: Emergency Medicine

## 2015-02-27 DIAGNOSIS — N390 Urinary tract infection, site not specified: Secondary | ICD-10-CM | POA: Insufficient documentation

## 2015-02-27 DIAGNOSIS — Z79899 Other long term (current) drug therapy: Secondary | ICD-10-CM | POA: Insufficient documentation

## 2015-02-27 DIAGNOSIS — R197 Diarrhea, unspecified: Secondary | ICD-10-CM | POA: Insufficient documentation

## 2015-02-27 DIAGNOSIS — Z3202 Encounter for pregnancy test, result negative: Secondary | ICD-10-CM | POA: Diagnosis not present

## 2015-02-27 DIAGNOSIS — R300 Dysuria: Secondary | ICD-10-CM | POA: Diagnosis present

## 2015-02-27 DIAGNOSIS — Z792 Long term (current) use of antibiotics: Secondary | ICD-10-CM | POA: Insufficient documentation

## 2015-02-27 LAB — BASIC METABOLIC PANEL
Anion gap: 9 (ref 5–15)
BUN: 13 mg/dL (ref 6–20)
CHLORIDE: 104 mmol/L (ref 101–111)
CO2: 26 mmol/L (ref 22–32)
Calcium: 9.1 mg/dL (ref 8.9–10.3)
Creatinine, Ser: 0.71 mg/dL (ref 0.44–1.00)
GFR calc non Af Amer: >60 mL/min (ref >60)
Glucose, Bld: 120 mg/dL — ABNORMAL HIGH (ref 65–99)
Potassium: 3.5 mmol/L (ref 3.5–5.1)
SODIUM: 139 mmol/L (ref 135–145)

## 2015-02-27 LAB — CBC WITH DIFFERENTIAL/PLATELET
BASOS PCT: 1 %
Basophils Absolute: 0.1 10*3/uL (ref 0–0.1)
EOS ABS: 0.1 10*3/uL (ref 0–0.7)
EOS PCT: 2 %
HCT: 41.8 % (ref 35.0–47.0)
Hemoglobin: 13.9 g/dL (ref 12.0–16.0)
Lymphocytes Relative: 38 %
Lymphs Abs: 2.6 10*3/uL (ref 1.0–3.6)
MCH: 29.4 pg (ref 26.0–34.0)
MCHC: 33.1 g/dL (ref 32.0–36.0)
MCV: 88.6 fL (ref 80.0–100.0)
Monocytes Absolute: 0.4 10*3/uL (ref 0.2–0.9)
Monocytes Relative: 6 %
NEUTROS PCT: 53 %
Neutro Abs: 3.7 10*3/uL (ref 1.4–6.5)
PLATELETS: 239 10*3/uL (ref 150–440)
RBC: 4.72 MIL/uL (ref 3.80–5.20)
RDW: 12.8 % (ref 11.5–14.5)
WBC: 6.8 10*3/uL (ref 3.6–11.0)

## 2015-02-27 LAB — URINALYSIS COMPLETE WITH MICROSCOPIC (ARMC ONLY)
Bilirubin Urine: NEGATIVE
GLUCOSE, UA: NEGATIVE mg/dL
KETONES UR: NEGATIVE mg/dL
Nitrite: NEGATIVE
PROTEIN: NEGATIVE mg/dL
SPECIFIC GRAVITY, URINE: 1.02 (ref 1.005–1.030)
pH: 5 (ref 5.0–8.0)

## 2015-02-27 LAB — POCT PREGNANCY, URINE: Preg Test, Ur: NEGATIVE

## 2015-02-27 MED ORDER — IBUPROFEN 800 MG PO TABS: 800.0000 mg | ORAL_TABLET | Freq: Three times a day (TID) | ORAL | Status: DC | PRN

## 2015-02-27 MED ORDER — DICYCLOMINE HCL 10 MG/ML IM SOLN: 20.0000 mg | Freq: Once | INTRAMUSCULAR | Status: DC

## 2015-02-27 MED ORDER — DICYCLOMINE HCL 20 MG PO TABS: 20.0000 mg | ORAL_TABLET | Freq: Three times a day (TID) | ORAL | Status: DC | PRN

## 2015-02-27 MED ORDER — ONDANSETRON HCL 4 MG/2ML IJ SOLN
4.0000 mg | Freq: Once | INTRAMUSCULAR | Status: AC
  Administered 2015-02-27: 4 mg via INTRAVENOUS

## 2015-02-27 MED ORDER — KETOROLAC TROMETHAMINE 30 MG/ML IJ SOLN
INTRAMUSCULAR | Status: AC
  Filled 2015-02-27: qty 1

## 2015-02-27 MED ORDER — CIPROFLOXACIN HCL 500 MG PO TABS
500.0000 mg | ORAL_TABLET | Freq: Once | ORAL | Status: AC
  Administered 2015-02-27: 500 mg via ORAL

## 2015-02-27 MED ORDER — CIPROFLOXACIN HCL 500 MG PO TABS: 500.0000 mg | ORAL_TABLET | Freq: Two times a day (BID) | ORAL | Status: AC

## 2015-02-27 MED ORDER — PHENAZOPYRIDINE HCL 200 MG PO TABS
ORAL_TABLET | ORAL | Status: AC
  Filled 2015-02-27: qty 1

## 2015-02-27 MED ORDER — SODIUM CHLORIDE 0.9 % IV BOLUS (SEPSIS)
1000.0000 mL | Freq: Once | INTRAVENOUS | Status: AC
  Administered 2015-02-27: 1000 mL via INTRAVENOUS

## 2015-02-27 MED ORDER — CIPROFLOXACIN HCL 500 MG PO TABS
ORAL_TABLET | ORAL | Status: DC
Start: 2015-02-27 — End: 2015-02-27
  Filled 2015-02-27: qty 1

## 2015-02-27 MED ORDER — DICYCLOMINE HCL 10 MG/ML IM SOLN
INTRAMUSCULAR | Status: AC
  Filled 2015-02-27: qty 2

## 2015-02-27 MED ORDER — PHENAZOPYRIDINE HCL 200 MG PO TABS: 200.0000 mg | ORAL_TABLET | Freq: Three times a day (TID) | ORAL | Status: DC | PRN

## 2015-02-27 MED ORDER — ONDANSETRON HCL 4 MG/2ML IJ SOLN
INTRAMUSCULAR | Status: AC
  Filled 2015-02-27: qty 2

## 2015-02-27 MED ORDER — PHENAZOPYRIDINE HCL 200 MG PO TABS
200.0000 mg | ORAL_TABLET | Freq: Once | ORAL | Status: AC
  Administered 2015-02-27: 200 mg via ORAL

## 2015-02-27 NOTE — ED Notes (Signed)
Pt states diaherra and difficulty urinating for 1 week, pt states this AM she used the bathroom and had foul smelling urine and dizziness

## 2015-02-27 NOTE — ED Provider Notes (Signed)
[facility] Emergency Department Provider Note  ____________________________________________  Time seen: 0721   I have reviewed the triage vital signs and the nursing notes.   HISTORY  Chief Complaint Dysuria and Diarrhea    HPI Haley Mclaughlin is a 49 y.o. female arrives here today with complaints of burning with urination frequency some weakness and dizziness states that she's had diarrhea for over a week now states that that seems to calm down but now she is having pain and burning with urination has been eating and drinking as much and overall just doesn't feel that wellto pain and discomfort as about a 6 out of 10 nothing making it particularly better or worse has had some subjective chills no nausea denies vaginal bleeding vaginal discharge or constipation no other complaints this time here today for further evaluation and treatment   Past Medical History  Diagnosis Date  . Hyperlipidemia   . Gestational diabetes     diet controlled with #3  . Preterm labor     with last 4 preg, del at term  . Urinary tract infection   . Abnormal Pap smear   . PID (pelvic inflammatory disease)   . Hyperlipemia   . Sarcoidosis     Patient Active Problem List   Diagnosis Date Noted  . Abnormal Pap smear of cervix 01/31/2015  . Pain in right foot 07/07/2013  . Hyperglycemia 06/05/2013  . 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 HYPERCHOLESTEROLEMIA 10/28/2006  . PANIC ATTACKS 10/28/2006  . PALPITATIONS 10/28/2006    Past Surgical History  Procedure Laterality Date  . Appendectomy    . Induced abortion      Current Outpatient Rx  Name  Route  Sig  Dispense  Refill  . ciprofloxacin (CIPRO) 500 MG tablet   Oral   Take 1 tablet (500 mg total) by mouth 2 (two) times daily.   14 tablet   0   . dicyclomine (BENTYL) 20 MG tablet   Oral   Take 1 tablet (20 mg total) by mouth 3 (three) times daily as needed (abdominal discomfort).   15 tablet   0   . ibuprofen  (ADVIL,MOTRIN) 800 MG tablet   Oral   Take 1 tablet (800 mg total) by mouth every 8 (eight) hours as needed.   30 tablet   0   . omeprazole (PRILOSEC) 40 MG capsule   Oral   Take 40 mg by mouth.         . phenazopyridine (PYRIDIUM) 200 MG tablet   Oral   Take 1 tablet (200 mg total) by mouth 3 (three) times daily as needed for pain.   10 tablet   0     Allergies Sulfamethoxazole-trimethoprim  Family History  Problem Relation Age of Onset  . Heart disease Mother   . Hypertension Mother   . Diabetes Father   . Heart disease Father   . Anesthesia problems Neg Hx     Social History History  Substance Use Topics  . Smoking status: Never Smoker   . Smokeless tobacco: Never Used  . Alcohol Use: No    Review of Systems Constitutional: No fever/chills Eyes: No visual changes. ENT: No sore throat. Cardiovascular: Denies chest pain. Respiratory: Denies shortness of breath. Gastrointestinal: No abdominal pain.  No nausea, no vomiting.  + diarrhea.  No constipation. Genitourinary:  dysuria. Musculoskeletal: Negative for back pain. Skin: Negative for rash. Neurological: Negative for headaches, focal weakness or numbness.  10-point ROS otherwise negative.  ____________________________________________  PHYSICAL EXAM:  VITAL SIGNS: ED Triage Vitals  Enc Vitals Group     BP 02/27/15 0712 155/89 mmHg     Pulse Rate 02/27/15 0712 52     Resp 02/27/15 0712 18     Temp 02/27/15 0712 98.2 F (36.8 C)     Temp Source 02/27/15 0712 Oral     SpO2 02/27/15 0713 98 %     Weight 02/27/15 0712 209 lb (94.802 kg)     Height 02/27/15 0712  (1.626 m)     Head Cir --      Peak Flow --      Pain Score 02/27/15 0713 6     Pain Loc --      Pain Edu? --      Excl. in GC? --     Constitutional: Alert and oriented. Well appearing and in no acute distress. Eyes: Conjunctivae are normal. PERRL. EOMI. Head: Atraumatic. Nose: No congestion/rhinnorhea. Mouth/Throat: Mucous  membranes are moist.  Oropharynx non-erythematous. Neck: No stridor.   Cardiovascular: Normal rate, regular rhythm. Grossly normal heart sounds.  Good peripheral circulation. Respiratory: Normal respiratory effort.  No retractions. Lungs CTAB. Gastrointestinal: Stomach soft mild suprapubic tenderness to palpation bowel sounds positive all 4 quadrants no CVA tenderness Musculoskeletal: No lower extremity tenderness nor edema.  No joint effusions. Neurologic:  Normal speech and language. No gross focal neurologic deficits are appreciated. Speech is normal. No gait instability. Skin:  Skin is warm, dry and intact. No rash noted. Psychiatric: Mood and affect are normal. Speech and behavior are normal.  ____________________________________________   LABS (all labs ordered are listed, but only abnormal results are displayed)  Labs Reviewed  URINALYSIS COMPLETEWITH MICROSCOPIC ([facility] ONLY) - Abnormal; Notable for the following:    Color, Urine YELLOW (*)    APPearance CLOUDY (*)    Hgb urine dipstick 2+ (*)    Leukocytes, UA 3+ (*)    Bacteria, UA RARE (*)    Squamous Epithelial / LPF 6-30 (*)    All other components within normal limits  BASIC METABOLIC PANEL - Abnormal; Notable for the following:    Glucose, Bld 120 (*)    All other components within normal limits  URINE CULTURE  CBC WITH DIFFERENTIAL/PLATELET  POCT PREGNANCY, URINE  POC URINE PREG, ED   ____________________________________________   PROCEDURES  Procedure(s) performed: None  Critical Care performed: No  ____________________________________________   INITIAL IMPRESSION / ASSESSMENT AND PLAN / ED COURSE  Pertinent labs & imaging results that were available during my care of the patient were reviewed by me and considered in my medical decision making (see chart for details).    impression on this patient urinary tract infection and diarrhea she received a liter of fluids Zofran and Cipro as feeling much  better in the department and a discharge her home prescription for Bentyl and Cipro and Pyridium follow-up with her doctor to 3 days if not improving return if any acute concerns or worsening symptoms ____________________________________________   FINAL CLINICAL IMPRESSION(S) / ED DIAGNOSES  Final diagnoses:  UTI (lower urinary tract infection)  Diarrhea in adult patient     Lakisa Lotz Rosalyn Gess, PA-C 02/27/15 1059  Phineas Semen, MD 02/27/15 1353

## 2015-02-27 NOTE — ED Notes (Signed)
Pt states she is having burning pain upon urination since yesterday with diarrhea

## 2015-02-28 LAB — URINE CULTURE

## 2015-03-08 DIAGNOSIS — Z9089 Acquired absence of other organs: Secondary | ICD-10-CM | POA: Insufficient documentation

## 2015-03-08 DIAGNOSIS — R1032 Left lower quadrant pain: Secondary | ICD-10-CM | POA: Diagnosis present

## 2015-03-08 DIAGNOSIS — Z3202 Encounter for pregnancy test, result negative: Secondary | ICD-10-CM | POA: Insufficient documentation

## 2015-03-08 DIAGNOSIS — Z792 Long term (current) use of antibiotics: Secondary | ICD-10-CM | POA: Diagnosis not present

## 2015-03-08 DIAGNOSIS — Z79899 Other long term (current) drug therapy: Secondary | ICD-10-CM | POA: Diagnosis not present

## 2015-03-08 LAB — CBC WITH DIFFERENTIAL/PLATELET
BASOS PCT: 1 %
Basophils Absolute: 0.1 10*3/uL (ref 0–0.1)
EOS ABS: 0.2 10*3/uL (ref 0–0.7)
Eosinophils Relative: 2 %
HEMATOCRIT: 40.2 % (ref 35.0–47.0)
HEMOGLOBIN: 13.3 g/dL (ref 12.0–16.0)
Lymphocytes Relative: 47 %
Lymphs Abs: 3.4 10*3/uL (ref 1.0–3.6)
MCH: 29.1 pg (ref 26.0–34.0)
MCHC: 33.1 g/dL (ref 32.0–36.0)
MCV: 87.9 fL (ref 80.0–100.0)
Monocytes Absolute: 0.4 10*3/uL (ref 0.2–0.9)
Monocytes Relative: 5 %
NEUTROS PCT: 45 %
Neutro Abs: 3.3 10*3/uL (ref 1.4–6.5)
Platelets: 230 10*3/uL (ref 150–440)
RBC: 4.57 MIL/uL (ref 3.80–5.20)
RDW: 13 % (ref 11.5–14.5)
WBC: 7.2 10*3/uL (ref 3.6–11.0)

## 2015-03-08 LAB — URINALYSIS COMPLETE WITH MICROSCOPIC (ARMC ONLY)
Bilirubin Urine: NEGATIVE
Glucose, UA: NEGATIVE mg/dL
HGB URINE DIPSTICK: NEGATIVE
Ketones, ur: NEGATIVE mg/dL
Leukocytes, UA: NEGATIVE
Nitrite: NEGATIVE
PROTEIN: NEGATIVE mg/dL
Specific Gravity, Urine: 1.019 (ref 1.005–1.030)
pH: 5 (ref 5.0–8.0)

## 2015-03-08 LAB — COMPREHENSIVE METABOLIC PANEL
ALBUMIN: 4.4 g/dL (ref 3.5–5.0)
ALT: 19 U/L (ref 14–54)
AST: 25 U/L (ref 15–41)
Alkaline Phosphatase: 47 U/L (ref 38–126)
Anion gap: 7 (ref 5–15)
BUN: 8 mg/dL (ref 6–20)
CALCIUM: 9.3 mg/dL (ref 8.9–10.3)
CHLORIDE: 105 mmol/L (ref 101–111)
CO2: 26 mmol/L (ref 22–32)
Creatinine, Ser: 0.68 mg/dL (ref 0.44–1.00)
GFR calc Af Amer: >60 mL/min (ref >60)
GFR calc non Af Amer: >60 mL/min (ref >60)
Glucose, Bld: 145 mg/dL — ABNORMAL HIGH (ref 65–99)
Potassium: 3.5 mmol/L (ref 3.5–5.1)
Sodium: 138 mmol/L (ref 135–145)
TOTAL PROTEIN: 7.8 g/dL (ref 6.5–8.1)
Total Bilirubin: 0.5 mg/dL (ref 0.3–1.2)

## 2015-03-08 LAB — POCT PREGNANCY, URINE: PREG TEST UR: NEGATIVE

## 2015-03-08 LAB — LIPASE, BLOOD: Lipase: 43 U/L (ref 22–51)

## 2015-03-08 NOTE — ED Notes (Signed)
Patient left side abd pain (points to mid area).  Patient also reports nausea and diarrhea.

## 2015-03-09 ENCOUNTER — Encounter: Payer: Self-pay | Admitting: Emergency Medicine

## 2015-03-09 ENCOUNTER — Emergency Department
Admission: EM | Admit: 2015-03-09 | Discharge: 2015-03-09 | Disposition: A | Discharge status: Home / Self Care | Payer: Medicaid Other | Attending: Emergency Medicine | Admitting: Emergency Medicine

## 2015-03-09 ENCOUNTER — Emergency Department: Payer: Medicaid Other

## 2015-03-09 DIAGNOSIS — R1032 Left lower quadrant pain: Secondary | ICD-10-CM

## 2015-03-09 MED ORDER — IOHEXOL 300 MG/ML  SOLN
125.0000 mL | Freq: Once | INTRAMUSCULAR | Status: AC | PRN
  Administered 2015-03-09: 125 mL via INTRAVENOUS

## 2015-03-09 MED ORDER — ONDANSETRON HCL 4 MG/2ML IJ SOLN
4.0000 mg | Freq: Once | INTRAMUSCULAR | Status: AC
  Administered 2015-03-09: 4 mg via INTRAVENOUS

## 2015-03-09 MED ORDER — MORPHINE SULFATE 4 MG/ML IJ SOLN
4.0000 mg | Freq: Once | INTRAMUSCULAR | Status: AC
  Administered 2015-03-09: 4 mg via INTRAVENOUS

## 2015-03-09 MED ORDER — OXYCODONE-ACETAMINOPHEN 5-325 MG PO TABS: 1.0000 | ORAL_TABLET | ORAL | Status: DC | PRN

## 2015-03-09 MED ORDER — MORPHINE SULFATE 4 MG/ML IJ SOLN
INTRAMUSCULAR | Status: AC
  Administered 2015-03-09: 4 mg via INTRAVENOUS
  Filled 2015-03-09: qty 1

## 2015-03-09 MED ORDER — SODIUM CHLORIDE 0.9 % IV BOLUS (SEPSIS)
1000.0000 mL | Freq: Once | INTRAVENOUS | Status: AC
  Administered 2015-03-09: 1000 mL via INTRAVENOUS

## 2015-03-09 MED ORDER — IBUPROFEN 800 MG PO TABS: 800.0000 mg | ORAL_TABLET | Freq: Three times a day (TID) | ORAL | Status: DC | PRN

## 2015-03-09 MED ORDER — ONDANSETRON HCL 4 MG/2ML IJ SOLN
INTRAMUSCULAR | Status: AC
  Administered 2015-03-09: 4 mg via INTRAVENOUS
  Filled 2015-03-09: qty 2

## 2015-03-09 MED ORDER — IOHEXOL 240 MG/ML SOLN
25.0000 mL | Freq: Once | INTRAMUSCULAR | Status: AC | PRN
  Administered 2015-03-09: 25 mL via ORAL

## 2015-03-09 NOTE — Discharge Instructions (Signed)
1. Take pain medicines as needed (Motrin/Percocet #15). 2. Return to the ER for worsening symptoms, persistent vomiting, difficulty breathing or other concerns.  Abdominal Pain, Women Abdominal (stomach, pelvic, or belly) pain can be caused by many things. It is important to tell your doctor:  The location of the pain.  Does it come and go or is it present all the time?  Are there things that start the pain (eating certain foods, exercise)?  Are there other symptoms associated with the pain (fever, nausea, vomiting, diarrhea)? All of this is helpful to know when trying to find the cause of the pain. CAUSES   Stomach: virus or bacteria infection, or ulcer.  Intestine: appendicitis (inflamed appendix), regional ileitis (Crohn's disease), ulcerative colitis (inflamed colon), irritable bowel syndrome, diverticulitis (inflamed diverticulum of the colon), or cancer of the stomach or intestine.  Gallbladder disease or stones in the gallbladder.  Kidney disease, kidney stones, or infection.  Pancreas infection or cancer.  Fibromyalgia (pain disorder).  Diseases of the female organs:  Uterus: fibroid (non-cancerous) tumors or infection.  Fallopian tubes: infection or tubal pregnancy.  Ovary: cysts or tumors.  Pelvic adhesions (scar tissue).  Endometriosis (uterus lining tissue growing in the pelvis and on the pelvic organs).  Pelvic congestion syndrome (female organs filling up with blood just before the menstrual period).  Pain with the menstrual period.  Pain with ovulation (producing an egg).  Pain with an IUD (intrauterine device, birth control) in the uterus.  Cancer of the female organs.  Functional pain (pain not caused by a disease, may improve without treatment).  Psychological pain.  Depression. DIAGNOSIS  Your doctor will decide the seriousness of your pain by doing an examination.  Blood tests.  X-rays.  Ultrasound.  CT scan (computed tomography,  special type of X-ray).  MRI (magnetic resonance imaging).  Cultures, for infection.  Barium enema (dye inserted in the large intestine, to better view it with X-rays).  Colonoscopy (looking in intestine with a lighted tube).  Laparoscopy (minor surgery, looking in abdomen with a lighted tube).  Major abdominal exploratory surgery (looking in abdomen with a large incision). TREATMENT  The treatment will depend on the cause of the pain.   Many cases can be observed and treated at home.  Over-the-counter medicines recommended by your caregiver.  Prescription medicine.  Antibiotics, for infection.  Birth control pills, for painful periods or for ovulation pain.  Hormone treatment, for endometriosis.  Nerve blocking injections.  Physical therapy.  Antidepressants.  Counseling with a psychologist or psychiatrist.  Minor or major surgery. HOME CARE INSTRUCTIONS   Do not take laxatives, unless directed by your caregiver.  Take over-the-counter pain medicine only if ordered by your caregiver. Do not take aspirin because it can cause an upset stomach or bleeding.  Try a clear liquid diet (broth or water) as ordered by your caregiver. Slowly move to a bland diet, as tolerated, if the pain is related to the stomach or intestine.  Have a thermometer and take your temperature several times a day, and record it.  Bed rest and sleep, if it helps the pain.  Avoid sexual intercourse, if it causes pain.  Avoid stressful situations.  Keep your follow-up appointments and tests, as your caregiver orders.  If the pain does not go away with medicine or surgery, you may try:  Acupuncture.  Relaxation exercises (yoga, meditation).  Group therapy.  Counseling. SEEK MEDICAL CARE IF:   You notice certain foods cause stomach pain.  Your home  care treatment is not helping your pain.  You need stronger pain medicine.  You want your IUD removed.  You feel faint or  lightheaded.  You develop nausea and vomiting.  You develop a rash.  You are having side effects or an allergy to your medicine. SEEK IMMEDIATE MEDICAL CARE IF:   Your pain does not go away or gets worse.  You have a fever.  Your pain is felt only in portions of the abdomen. The right side could possibly be appendicitis. The left lower portion of the abdomen could be colitis or diverticulitis.  You are passing blood in your stools (bright red or black tarry stools, with or without vomiting).  You have blood in your urine.  You develop chills, with or without a fever.  You pass out. MAKE SURE YOU:   Understand these instructions.  Will watch your condition.  Will get help right away if you are not doing well or get worse. Document Released: 06/14/2007 Document Revised: 01/01/2014 Document Reviewed: 07/04/2009 [facility] Patient Information 2015 [place], [place]. This information is not intended to replace advice given to you by your health care provider. Make sure you discuss any questions you have with your health care provider.

## 2015-03-09 NOTE — ED Provider Notes (Signed)
[facility] Emergency Department Provider Note  ____________________________________________  Time seen: Approximately 1:21 AM  I have reviewed the triage vital signs and the nursing notes.   HISTORY  Chief Complaint Abdominal Pain    HPI Haley Mclaughlin is a 49 y.o. female who presents with a 3 day history of left side abdominal pain.Patient was seen one week ago for urinary tract infection and states she has finished the antibiotics. Patient describes 8/10 waxing/waning crampy discomfort in her left mid and left lower quadrant which she states is similar to her prior bouts with diverticulitis. Patient states she has been having multiple bouts of diarrhea per day. Denies fever, chills, chest pain, shortness of breath, vomiting. Nothing makes the pain better or worse.   Past Medical History  Diagnosis Date  . Hyperlipidemia   . Gestational diabetes     diet controlled with #3  . Preterm labor     with last 4 preg, del at term  . Urinary tract infection   . Abnormal Pap smear   . PID (pelvic inflammatory disease)   . Hyperlipemia   . Sarcoidosis   Diverticulitis  Patient Active Problem List   Diagnosis Date Noted  . Abnormal Pap smear of cervix 01/31/2015  . Pain in right foot 07/07/2013  . Hyperglycemia 06/05/2013  . 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 HYPERCHOLESTEROLEMIA 10/28/2006  . PANIC ATTACKS 10/28/2006  . PALPITATIONS 10/28/2006    Past Surgical History  Procedure Laterality Date  . Appendectomy    . Induced abortion      Current Outpatient Rx  Name  Route  Sig  Dispense  Refill  . omeprazole (PRILOSEC) 40 MG capsule   Oral   Take 40 mg by mouth.         . ciprofloxacin (CIPRO) 500 MG tablet   Oral   Take 1 tablet (500 mg total) by mouth 2 (two) times daily.   14 tablet   0   . dicyclomine (BENTYL) 20 MG tablet   Oral   Take 1 tablet (20 mg total) by mouth 3 (three) times daily as needed (abdominal discomfort).   15  tablet   0   . ibuprofen (ADVIL,MOTRIN) 800 MG tablet   Oral   Take 1 tablet (800 mg total) by mouth every 8 (eight) hours as needed.   30 tablet   0   . phenazopyridine (PYRIDIUM) 200 MG tablet   Oral   Take 1 tablet (200 mg total) by mouth 3 (three) times daily as needed for pain.   10 tablet   0     Allergies Sulfamethoxazole-trimethoprim  Family History  Problem Relation Age of Onset  . Heart disease Mother   . Hypertension Mother   . Diabetes Father   . Heart disease Father   . Anesthesia problems Neg Hx     Social History History  Substance Use Topics  . Smoking status: Never Smoker   . Smokeless tobacco: Never Used  . Alcohol Use: No    Review of Systems Constitutional: No fever/chills Eyes: No visual changes. ENT: No sore throat. Cardiovascular: Denies chest pain. Respiratory: Denies shortness of breath. Gastrointestinal: Positive for abdominal pain.  No nausea, no vomiting. Positive for diarrhea.  No constipation. Genitourinary: Negative for dysuria. Musculoskeletal: Negative for back pain. Skin: Negative for rash. Neurological: Negative for headaches, focal weakness or numbness.  10-point ROS otherwise negative.  ____________________________________________   PHYSICAL EXAM:  VITAL SIGNS: ED Triage Vitals  Enc Vitals Group  BP 03/08/15 2100 146/82 mmHg     Pulse Rate 03/08/15 2100 85     Resp 03/08/15 2100 22     Temp 03/08/15 2100 98.4 F (36.9 C)     Temp Source 03/08/15 2100 Oral     SpO2 03/08/15 2100 97 %     Weight 03/08/15 2100 209 lb (94.802 kg)     Height 03/08/15 2100  (1.626 m)     Head Cir --      Peak Flow --      Pain Score 03/08/15 2101 6     Pain Loc --      Pain Edu? --      Excl. in GC? --     Constitutional: Alert and oriented. Well appearing and in no acute distress. Eyes: Conjunctivae are normal. PERRL. EOMI. Head: Atraumatic. Nose: No congestion/rhinnorhea. Mouth/Throat: Mucous membranes are moist.   Oropharynx non-erythematous. Neck: No stridor.   Cardiovascular: Normal rate, regular rhythm. Grossly normal heart sounds.  Good peripheral circulation. Respiratory: Normal respiratory effort.  No retractions. Lungs CTAB. Gastrointestinal: Soft, mildly tender to palpation left upper, left mid and left lower quadrant without associated rebound or guarding. No distention. No abdominal bruits. No CVA tenderness. Musculoskeletal: No lower extremity tenderness nor edema.  No joint effusions. Neurologic:  Normal speech and language. No gross focal neurologic deficits are appreciated. Speech is normal. No gait instability. Skin:  Skin is warm, dry and intact. No rash noted. Psychiatric: Mood and affect are normal. Speech and behavior are normal.  ____________________________________________   LABS (all labs ordered are listed, but only abnormal results are displayed)  Labs Reviewed  COMPREHENSIVE METABOLIC PANEL - Abnormal; Notable for the following:    Glucose, Bld 145 (*)    All other components within normal limits  URINALYSIS COMPLETEWITH MICROSCOPIC ([facility] ONLY) - Abnormal; Notable for the following:    Color, Urine YELLOW (*)    APPearance CLOUDY (*)    Bacteria, UA RARE (*)    Squamous Epithelial / LPF 6-30 (*)    All other components within normal limits  CBC WITH DIFFERENTIAL/PLATELET  LIPASE, BLOOD  POC URINE PREG, ED  POCT PREGNANCY, URINE   ____________________________________________  EKG  None ____________________________________________  RADIOLOGY  CT abdomen and pelvis interpreted per Dr. Karie Kirks: Small amount of free fluid in the RIGHT pelvis with probable small ruptured ovarian cyst.  Mild diverticulosis without CT findings of acute diverticulitis.  ____________________________________________   PROCEDURES  Procedure(s) performed: None  Critical Care performed: No  ____________________________________________   INITIAL IMPRESSION / ASSESSMENT AND  PLAN / ED COURSE  Pertinent labs & imaging results that were available during my care of the patient were reviewed by me and considered in my medical decision making (see chart for details).  49 year old female who presents with left-sided abdominal pain and diarrhea which she states feels similar to her prior episodes of diverticulitis. Laboratory and urinalysis results unremarkable. Will proceed with CT abdomen/pelvis to evaluate etiology of abdominal pain.  ----------------------------------------- 4:01 AM on 03/09/2015 -----------------------------------------  Patient sleeping. Updated patient of CT findings. Plan for limited quantity of analgesia and gynecological follow-up. Return precautions given. Patient verbalizes understanding and agrees with plan of care. ____________________________________________   FINAL CLINICAL IMPRESSION(S) / ED DIAGNOSES  Final diagnoses:  Left lower quadrant pain      Irean Hong, MD 03/09/15 [phone]

## 2015-03-13 ENCOUNTER — Encounter: Payer: Self-pay | Admitting: Family Medicine

## 2015-03-13 ENCOUNTER — Ambulatory Visit (INDEPENDENT_AMBULATORY_CARE_PROVIDER_SITE_OTHER): Payer: Medicaid Other | Admitting: Family Medicine

## 2015-03-13 VITALS — BP 137/87 | HR 75 | Temp 98.4°F | Wt 209.0 lb

## 2015-03-13 DIAGNOSIS — R87619 Unspecified abnormal cytological findings in specimens from cervix uteri: Secondary | ICD-10-CM | POA: Diagnosis present

## 2015-03-13 NOTE — Progress Notes (Signed)
   Subjective:    Patient ID: Haley DoomWendy D Mcgrady, female    DOB: 1966/06/01, 49 y.o.   MRN: [id number]  HPI  Abd pain -  Seen in ER a few days ago.  Has continued but improved left upper and lower abd pain.  No fever or nv.   Has had diarrhea for months since last diverticulitis.  No bleeding.   Abnl Pap Had HPV - has had in past and had colposcopy (she is unsure of results)  No bleeding.  Does have regular menstrual periods  Chief Complaint noted Review of Symptoms - see HPI PMH - Smoking status noted.   Vital Signs reviewed   Review of Systems     Objective:   Physical Exam Alert nad Abdomen: soft and with mild-tenderness superficially over left upper quadrant without masses, organomegaly or hernias noted.  No guarding or rebound      Assessment & Plan:    Abdomen Pain - seem mostly superficial musculoskeletal  Cause.  No signs of abdomen organ involvement. [facility]

## 2015-03-13 NOTE — Patient Instructions (Signed)
The abd pain should slowly improve.  If any fevers or bleeding come in  [facility] ask [facility] a [facility]  Once [facility] is done come in to discuss the cholesterol  Come back in 1 year May 2017 for a repeat PAP smear

## 2015-03-13 NOTE — Assessment & Plan Note (Signed)
Discussed with her.

## 2015-03-21 ENCOUNTER — Telehealth: Payer: Self-pay | Admitting: Family Medicine

## 2015-03-21 NOTE — Telephone Encounter (Signed)
Has appt at [facility] in Sept With her issues, she doesn't think she can wait until then Please advise

## 2015-03-26 NOTE — Telephone Encounter (Signed)
Spoke with patient and she states that Dr. Danella Deis referred her to [facility] for her GI issues and thinks they are too serious to wait until September.  She would like to know if she needs to be referred somewhere else and if so does she need to let Dr. Nino Parsley office know so they can do it?  Also is there anything she needs to do in the meantime for this. Jazmin Hartsell,CMA

## 2015-03-27 NOTE — Telephone Encounter (Signed)
If she is already seeing a GI doctor she would need to discuss with them about another referral   If her pain is very severe or worsening or if she has persistnent nausea vomiting or fever she should be seen soon  Thanks

## 2015-04-08 ENCOUNTER — Emergency Department (HOSPITAL_COMMUNITY): Payer: Medicaid Other

## 2015-04-08 ENCOUNTER — Emergency Department (HOSPITAL_COMMUNITY)
Admission: EM | Admit: 2015-04-08 | Discharge: 2015-04-09 | Disposition: A | Discharge status: Home / Self Care | Payer: Medicaid Other | Attending: Emergency Medicine | Admitting: Emergency Medicine

## 2015-04-08 ENCOUNTER — Encounter (HOSPITAL_COMMUNITY): Payer: Self-pay | Admitting: Emergency Medicine

## 2015-04-08 DIAGNOSIS — R202 Paresthesia of skin: Secondary | ICD-10-CM | POA: Diagnosis not present

## 2015-04-08 DIAGNOSIS — Z79899 Other long term (current) drug therapy: Secondary | ICD-10-CM | POA: Insufficient documentation

## 2015-04-08 DIAGNOSIS — Z87440 Personal history of urinary (tract) infections: Secondary | ICD-10-CM | POA: Insufficient documentation

## 2015-04-08 DIAGNOSIS — R200 Anesthesia of skin: Secondary | ICD-10-CM | POA: Diagnosis present

## 2015-04-08 DIAGNOSIS — Z7951 Long term (current) use of inhaled steroids: Secondary | ICD-10-CM | POA: Diagnosis not present

## 2015-04-08 DIAGNOSIS — M542 Cervicalgia: Secondary | ICD-10-CM | POA: Diagnosis not present

## 2015-04-08 DIAGNOSIS — Z8632 Personal history of gestational diabetes: Secondary | ICD-10-CM | POA: Insufficient documentation

## 2015-04-08 DIAGNOSIS — Z8639 Personal history of other endocrine, nutritional and metabolic disease: Secondary | ICD-10-CM | POA: Diagnosis not present

## 2015-04-08 DIAGNOSIS — Z8751 Personal history of pre-term labor: Secondary | ICD-10-CM | POA: Diagnosis not present

## 2015-04-08 LAB — CBC WITH DIFFERENTIAL/PLATELET
BASOS ABS: 0 10*3/uL (ref 0.0–0.1)
Basophils Relative: 1 % (ref 0–1)
EOS PCT: 2 % (ref 0–5)
Eosinophils Absolute: 0.1 10*3/uL (ref 0.0–0.7)
HCT: 41.4 % (ref 36.0–46.0)
Hemoglobin: 13.9 g/dL (ref 12.0–15.0)
Lymphocytes Relative: 36 % (ref 12–46)
Lymphs Abs: 2.5 10*3/uL (ref 0.7–4.0)
MCH: 30 pg (ref 26.0–34.0)
MCHC: 33.6 g/dL (ref 30.0–36.0)
MCV: 89.2 fL (ref 78.0–100.0)
MONOS PCT: 5 % (ref 3–12)
Monocytes Absolute: 0.3 10*3/uL (ref 0.1–1.0)
Neutro Abs: 3.9 10*3/uL (ref 1.7–7.7)
Neutrophils Relative %: 56 % (ref 43–77)
Platelets: 236 10*3/uL (ref 150–400)
RBC: 4.64 MIL/uL (ref 3.87–5.11)
RDW: 13 % (ref 11.5–15.5)
WBC: 6.8 10*3/uL (ref 4.0–10.5)

## 2015-04-08 LAB — BASIC METABOLIC PANEL
Anion gap: 10 (ref 5–15)
BUN: 8 mg/dL (ref 6–20)
CALCIUM: 9.4 mg/dL (ref 8.9–10.3)
CHLORIDE: 103 mmol/L (ref 101–111)
CO2: 25 mmol/L (ref 22–32)
Creatinine, Ser: 0.87 mg/dL (ref 0.44–1.00)
GFR calc Af Amer: >60 mL/min (ref >60)
Glucose, Bld: 150 mg/dL — ABNORMAL HIGH (ref 65–99)
Potassium: 4.1 mmol/L (ref 3.5–5.1)
Sodium: 138 mmol/L (ref 135–145)

## 2015-04-08 LAB — TROPONIN I: Troponin I: <0.03 ng/mL (ref <0.031)

## 2015-04-08 NOTE — ED Notes (Signed)
PA at bedside.

## 2015-04-08 NOTE — ED Notes (Signed)
Pt sts numbness to left side of neck into left arm and face upon waking this am; pt sts left sided neck pain several days; no obvious neuro deficits noted

## 2015-04-09 NOTE — ED Provider Notes (Signed)
CSN: [id number]     Arrival date & time 04/08/15  1431 History   First MD Initiated Contact with Patient 04/08/15 2010     Chief Complaint  Patient presents with  . Numbness     (Consider location/radiation/quality/duration/timing/severity/associated sxs/prior Treatment) HPI Haley Mclaughlin is a 49 y.o. female with history of sarcoidosis, presents to emergency department complaining of pain to the left side of the neck, numbness and tingling to the left hand and left face. Patient states that her symptoms started this morning when she woke up. States she woke up with left-sided neck pain. She states she didn't notice some tingling sensation in the left hand. She states "my left side of face has felt heavy." Patient states she has had similar symptoms in the past, states she was diagnosed with a TIA, treated [place]. She states however she was never admitted, and has never had MRI of her brain. States "they just CTed my head and kept me in ER to monitor and then discharged home." She states symptoms have been constant throughout the day. She denies any weakness to the hand, no drooping of the face, no difficulty speaking or walking. No other complaints. Denies chest pain or shortness of breath.  Past Medical History  Diagnosis Date  . Hyperlipidemia   . Gestational diabetes     diet controlled with #3  . Preterm labor     with last 4 preg, del at term  . Urinary tract infection   . Abnormal Pap smear   . PID (pelvic inflammatory disease)   . Hyperlipemia   . Sarcoidosis    Past Surgical History  Procedure Laterality Date  . Appendectomy    . Induced abortion     Family History  Problem Relation Age of Onset  . Heart disease Mother   . Hypertension Mother   . Diabetes Father   . Heart disease Father   . Anesthesia problems Neg Hx    History  Substance Use Topics  . Smoking status: Never Smoker   . Smokeless tobacco: Never Used  . Alcohol Use: No   OB History    Gravida Para  Term Preterm AB TAB SAB Ectopic Multiple Living   8 6 6  0 2 1 1  0 0 6     Review of Systems  Constitutional: Negative for fever and chills.  Eyes: Negative for photophobia and visual disturbance.  Respiratory: Negative for cough, chest tightness and shortness of breath.   Cardiovascular: Negative for chest pain, palpitations and leg swelling.  Gastrointestinal: Negative for nausea, vomiting, abdominal pain and diarrhea.  Genitourinary: Negative for dysuria, flank pain and pelvic pain.  Musculoskeletal: Positive for neck pain. Negative for myalgias, arthralgias and neck stiffness.  Skin: Negative for rash.  Neurological: Positive for numbness. Negative for dizziness, weakness and headaches.  All other systems reviewed and are negative.     Allergies  Sulfamethoxazole-trimethoprim  Home Medications   Prior to Admission medications   Medication Sig Start Date End Date Taking? Authorizing Provider  acetaminophen (TYLENOL) 500 MG tablet Take 1,000 mg by mouth every 6 (six) hours as needed for moderate pain.   Yes Historical Provider, MD  fluticasone (FLONASE) 50 MCG/ACT nasal spray Place 1 spray into both nostrils daily.    Yes Historical Provider, MD  omeprazole (PRILOSEC) 40 MG capsule Take 40 mg by mouth. 12/11/14  Yes Historical Provider, MD  ibuprofen (ADVIL,MOTRIN) 800 MG tablet Take 1 tablet (800 mg total) by mouth every 8 (eight) hours  as needed for moderate pain. 03/09/15   Irean Hong, MD  oxyCODONE-acetaminophen (ROXICET) 5-325 MG per tablet Take 1 tablet by mouth every 4 (four) hours as needed for severe pain. Patient not taking: Reported on 03/13/2015 03/09/15   Irean Hong, MD   BP 121/72 mmHg  Pulse 79  Temp(Src) 98.3 F (36.8 C) (Oral)  Resp 13  SpO2 99%  LMP 04/02/2015 Physical Exam  Constitutional: She is oriented to person, place, and time. She appears well-developed and well-nourished. No distress.  HENT:  Head: Normocephalic and atraumatic.  Eyes: Conjunctivae  are normal. Pupils are equal, round, and reactive to light.  Neck: Normal range of motion. Neck supple.  ttp over left sternocleidomastoid. No bruits.   Cardiovascular: Normal rate, regular rhythm and normal heart sounds.   Pulmonary/Chest: Effort normal and breath sounds normal. No respiratory distress. She has no wheezes. She has no rales.  Abdominal: Soft. Bowel sounds are normal. She exhibits no distension. There is no tenderness. There is no rebound.  Musculoskeletal: She exhibits no edema.  Distal radial pulses intact and equal bilaterally  Neurological: She is alert and oriented to person, place, and time. No cranial nerve deficit. Coordination normal.  5/5 and equal upper and lower extremity strength bilaterally. Sensation intact and equal in all dermatomes of the upper extremities bilaterally. Equal grip strength bilaterally. Normal finger to nose and heel to shin. No pronator drift. Patellar reflexes 2+. Gait is normal.   Skin: Skin is warm and dry.  Psychiatric: She has a normal mood and affect. Her behavior is normal.  Nursing note and vitals reviewed.   ED Course  Procedures (including critical care time) Labs Review Labs Reviewed  BASIC METABOLIC PANEL - Abnormal; Notable for the following:    Glucose, Bld 150 (*)    All other components within normal limits  CBC WITH DIFFERENTIAL/PLATELET  TROPONIN I    Imaging Review Dg Chest 2 View  04/08/2015   CLINICAL DATA:  Left neck, face and hand numbness for 1 day. No known injury.  EXAM: CHEST  2 VIEW  COMPARISON:  Single view of the chest 02/10/2014.  FINDINGS: The lungs are clear. Heart size is normal. There is no pneumothorax or pleural effusion. No focal bony abnormality is identified.  IMPRESSION: Negative chest.   Electronically Signed   By: Drusilla Kanner M.D.   On: 04/08/2015 15:27   Mr Brain Wo Contrast  04/09/2015   CLINICAL DATA:  Initial evaluation for acute left-sided numbness.  EXAM: MRI HEAD WITHOUT CONTRAST   TECHNIQUE: Multiplanar, multiecho pulse sequences of the brain and surrounding structures were obtained without intravenous contrast.  COMPARISON:  None.  FINDINGS: The CSF containing spaces are within normal limits for patient age. No focal parenchymal signal abnormality is identified. No mass lesion, midline shift, or extra-axial fluid collection. Ventricles are normal in size without evidence of hydrocephalus.  No diffusion-weighted signal abnormality is identified to suggest acute intracranial infarct. Gray-white matter differentiation is maintained. Normal flow voids are seen within the intracranial vasculature. No intracranial hemorrhage identified.  The cervicomedullary junction is normal. Pituitary gland is within normal limits. Pituitary stalk is midline. The globes and optic nerves demonstrate a normal appearance with normal signal intensity.  The bone marrow signal intensity is normal. Calvarium is intact. Visualized upper cervical spine is within normal limits.  Scalp soft tissues are unremarkable.  Mild mucosal thickening within the sphenoid sinuses bilaterally. Paranasal sinuses are otherwise clear. No mastoid effusion. Inner ear structures within  normal limits. Bone marrow signal intensity normal. Scalp soft tissues unremarkable.  IMPRESSION: Normal brain MRI with no acute intracranial process identified.   Electronically Signed   By: Rise Mu M.D.   On: 04/09/2015 00:04    ED ECG REPORT   Date: 04/09/2015  Rate: 86  Rhythm: normal sinus rhythm  QRS Axis: normal  Intervals: normal  ST/T Wave abnormalities: normal  Conduction Disutrbances:none  Narrative Interpretation:   Old EKG Reviewed: unchanged  I have personally reviewed the EKG tracing and agree with the computerized printout as noted.   MDM   Final diagnoses:  Paresthesia   Pt with left hand paresthesias, left-sided neck pain, left facial tingling. Onset of symptoms this morning. Symptoms are constant and  still present. No headache. No chest pain or shortness of breath. Cardiac workup initiated by triage, I am more concerned about possible CVA. Exam is unremarkable with strength and sensation intact in all extremities. No facial droop. vital signs are normal. discussed with Dr. Effie Shy, will get MRI to rule out CVA.  1:05 AM Patient's MRI is negative. Given persistence of the symptoms, with normal MRI, and doubt CVA or TIA. Most likely cervical radiculopathy. Will have patient follow with primary care doctor for further evaluation and treatment. At this time strength is intact, do not think she needs an emergent MRI of her cervical spine.   Filed Vitals:   04/08/15 2337 04/09/15 0000 04/09/15 0030 04/09/15 0100  BP: 121/72 117/72 118/80 118/73  Pulse: 79 88 87 78  Temp:      TempSrc:      Resp: 13 16 15 18   SpO2: 99% 98% 100% 98%     Jaynie Crumble, PA-C 04/09/15 0123  Mancel Bale, MD 04/10/15 [phone]

## 2015-04-09 NOTE — Telephone Encounter (Signed)
Spoke with patient and gave her message from Dr. Deirdre Priest.  She will call Dr. Nino Parsley office to see if there is anything they can do to get her in sooner.  Haley Mclaughlin,CMA

## 2015-04-09 NOTE — Discharge Instructions (Signed)
Your lab work and MRI unremarkable. Please follow up with primary care doctor for further evaluation. Return if worsening.   Paresthesia Paresthesia is an abnormal burning or prickling sensation. This sensation is generally felt in the hands, arms, legs, or feet. However, it may occur in any part of the body. It is usually not painful. The feeling may be described as:  Tingling or numbness.  "Pins and needles."  Skin crawling.  Buzzing.  Limbs "falling asleep."  Itching. Most people experience temporary (transient) paresthesia at some time in their lives. CAUSES  Paresthesia may occur when you breathe too quickly (hyperventilation). It can also occur without any apparent cause. Commonly, paresthesia occurs when pressure is placed on a nerve. The feeling quickly goes away once the pressure is removed. For some people, however, paresthesia is a long-lasting (chronic) condition caused by an underlying disorder. The underlying disorder may be:  A traumatic, direct injury to nerves. Examples include a:  Broken (fractured) neck.  Fractured skull.  A disorder affecting the brain and spinal cord (central nervous system). Examples include:  Transverse myelitis.  Encephalitis.  Transient ischemic attack.  Multiple sclerosis.  Stroke.  Tumor or blood vessel problems, such as an arteriovenous malformation pressing against the brain or spinal cord.  A condition that damages the peripheral nerves (peripheral neuropathy). Peripheral nerves are not part of the brain and spinal cord. These conditions include:  Diabetes.  Peripheral vascular disease.  Nerve entrapment syndromes, such as carpal tunnel syndrome.  Shingles.  Hypothyroidism.  Vitamin B12 deficiencies.  Alcoholism.  Heavy metal poisoning (lead, arsenic).  Rheumatoid arthritis.  Systemic lupus erythematosus. DIAGNOSIS  Your caregiver will attempt to find the underlying cause of your paresthesia. Your caregiver  may:  Take your medical history.  Perform a physical exam.  Order various lab tests.  Order imaging tests. TREATMENT  Treatment for paresthesia depends on the underlying cause. HOME CARE INSTRUCTIONS  Avoid drinking alcohol.  You may consider massage or acupuncture to help relieve your symptoms.  Keep all follow-up appointments as directed by your caregiver. SEEK IMMEDIATE MEDICAL CARE IF:   You feel weak.  You have trouble walking or moving.  You have problems with speech or vision.  You feel confused.  You cannot control your bladder or bowel movements.  You feel numbness after an injury.  You faint.  Your burning or prickling feeling gets worse when walking.  You have pain, cramps, or dizziness.  You develop a rash. MAKE SURE YOU:  Understand these instructions.  Will watch your condition.  Will get help right away if you are not doing well or get worse. Document Released: 08/07/2002 Document Revised: 11/09/2011 Document Reviewed: 05/08/2011 [facility] Patient Information 2015 [place], [place]. This information is not intended to replace advice given to you by your health care provider. Make sure you discuss any questions you have with your health care provider.

## 2015-04-10 ENCOUNTER — Telehealth: Payer: Self-pay | Admitting: Family Medicine

## 2015-04-10 NOTE — Telephone Encounter (Signed)
Spoke with Erie Noe and gave her our [employer] access number.  Patient was referred from her other specialist whom we sent her to.  6 visits for 6 months.  Wanetta Funderburke,CMA

## 2015-04-10 NOTE — Telephone Encounter (Signed)
Office left message on medical records line needing NPI, Dr's NPI, and number of visits authorized.

## 2015-04-23 ENCOUNTER — Encounter: Payer: Self-pay | Admitting: Emergency Medicine

## 2015-04-23 ENCOUNTER — Emergency Department
Admission: EM | Admit: 2015-04-23 | Discharge: 2015-04-23 | Disposition: A | Discharge status: Home / Self Care | Payer: Medicaid Other | Attending: Emergency Medicine | Admitting: Emergency Medicine

## 2015-04-23 ENCOUNTER — Emergency Department: Payer: Medicaid Other

## 2015-04-23 DIAGNOSIS — Z79899 Other long term (current) drug therapy: Secondary | ICD-10-CM | POA: Insufficient documentation

## 2015-04-23 DIAGNOSIS — S93602A Unspecified sprain of left foot, initial encounter: Secondary | ICD-10-CM | POA: Diagnosis not present

## 2015-04-23 DIAGNOSIS — S99922A Unspecified injury of left foot, initial encounter: Secondary | ICD-10-CM | POA: Diagnosis present

## 2015-04-23 DIAGNOSIS — Y9302 Activity, running: Secondary | ICD-10-CM | POA: Insufficient documentation

## 2015-04-23 DIAGNOSIS — X58XXXA Exposure to other specified factors, initial encounter: Secondary | ICD-10-CM | POA: Insufficient documentation

## 2015-04-23 DIAGNOSIS — Y9289 Other specified places as the place of occurrence of the external cause: Secondary | ICD-10-CM | POA: Diagnosis not present

## 2015-04-23 DIAGNOSIS — Y998 Other external cause status: Secondary | ICD-10-CM | POA: Insufficient documentation

## 2015-04-23 DIAGNOSIS — Z7951 Long term (current) use of inhaled steroids: Secondary | ICD-10-CM | POA: Diagnosis not present

## 2015-04-23 NOTE — ED Provider Notes (Signed)
[facility] Emergency Department Provider Note ____________________________________________  Time seen: 1900  I have reviewed the triage vital signs and the nursing notes.  HISTORY  Chief Complaint  Foot Injury  HPI Haley Mclaughlin is a 49 y.o. female reports to the ED for evaluation of left lateral foot pain today. She describes she was running out of the house barefoot behind her small dog, when she literally dropped off the curb, landing flat-footed with immediate pain to the lateral aspect of the left foot. She denies any fall or trip or any other injury at this time.She rates her pain at a 4/10 during the interview.  Past Medical History  Diagnosis Date  . Hyperlipidemia   . Gestational diabetes     diet controlled with #3  . Preterm labor     with last 4 preg, del at term  . Urinary tract infection   . Abnormal Pap smear   . PID (pelvic inflammatory disease)   . Hyperlipemia   . Sarcoidosis     Patient Active Problem List   Diagnosis Date Noted  . Abnormal Pap smear of cervix 01/31/2015  . Hyperglycemia 06/05/2013  . 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 HYPERCHOLESTEROLEMIA 10/28/2006  . PANIC ATTACKS 10/28/2006  . PALPITATIONS 10/28/2006    Past Surgical History  Procedure Laterality Date  . Appendectomy    . Induced abortion      Current Outpatient Rx  Name  Route  Sig  Dispense  Refill  . acetaminophen (TYLENOL) 500 MG tablet   Oral   Take 1,000 mg by mouth every 6 (six) hours as needed for moderate pain.         . fluticasone (FLONASE) 50 MCG/ACT nasal spray   Each Nare   Place 1 spray into both nostrils daily.          Marland Kitchen ibuprofen (ADVIL,MOTRIN) 800 MG tablet   Oral   Take 1 tablet (800 mg total) by mouth every 8 (eight) hours as needed for moderate pain.   15 tablet   0   . omeprazole (PRILOSEC) 40 MG capsule   Oral   Take 40 mg by mouth.         . oxyCODONE-acetaminophen (ROXICET) 5-325 MG per tablet   Oral   Take  1 tablet by mouth every 4 (four) hours as needed for severe pain. Patient not taking: Reported on 03/13/2015   15 tablet   0    Allergies Sulfamethoxazole-trimethoprim  Family History  Problem Relation Age of Onset  . Heart disease Mother   . Hypertension Mother   . Diabetes Father   . Heart disease Father   . Anesthesia problems Neg Hx     Social History Social History  Substance Use Topics  . Smoking status: Never Smoker   . Smokeless tobacco: Never Used  . Alcohol Use: No   Review of Systems  Constitutional: Negative for fever. Eyes: Negative for visual changes. ENT: Negative for sore throat. Cardiovascular: Negative for chest pain. Respiratory: Negative for shortness of breath. Gastrointestinal: Negative for abdominal pain, vomiting and diarrhea. Genitourinary: Negative for dysuria. Musculoskeletal: Negative for back pain. Left foot pain as above Skin: Negative for rash. Neurological: Negative for headaches, focal weakness or numbness. ____________________________________________  PHYSICAL EXAM:  VITAL SIGNS: ED Triage Vitals  Enc Vitals Group     BP 04/23/15 1751 134/84 mmHg     Pulse Rate 04/23/15 1751 74     Resp 04/23/15 1751 18  Temp 04/23/15 1751 98 F (36.7 C)     Temp Source 04/23/15 1751 Oral     SpO2 04/23/15 1751 98 %     Weight 04/23/15 1751 210 lb (95.255 kg)     Height 04/23/15 1751  (1.626 m)     Head Cir --      Peak Flow --      Pain Score --      Pain Loc --      Pain Edu? --      Excl. in GC? --    Constitutional: Alert and oriented. Well appearing and in no distress. Eyes: Conjunctivae are normal. PERRL. Normal extraocular movements. ENT   Head: Normocephalic and atraumatic.   Nose: No congestion/rhinnorhea.   Mouth/Throat: Mucous membranes are moist.   Neck: Supple. No thyromegaly. Hematological/Lymphatic/Immunilogical: No cervical lymphadenopathy. Cardiovascular: Normal rate, regular rhythm. Normal distal  pulses Respiratory: Normal respiratory effort.  Musculoskeletal: Nontender with normal range of motion in all extremities. Left foot without deformity, edema, or abrasion. Tenderness to palp along the lateral aspect at the base of the 5th MT. Normal ankle ROM. No calf or achilles tenderness noted.  Neurologic:  Normal gait without ataxia. Normal speech and language. No gross focal neurologic deficits are appreciated. Skin:  Skin is warm, dry and intact. No rash noted. Psychiatric: Mood and affect are normal. Patient exhibits appropriate insight and judgment. ____________________________________________   RADIOLOGY Left Foot IMPRESSION: No evidence for acute fracture or dislocation.  I, Deondrae Mcgrail, Charlesetta Ivory, personally viewed and evaluated these images (plain radiographs) as part of my medical decision making.  ____________________________________________  PROCEDURES  Post-op shoe ____________________________________________  INITIAL IMPRESSION / ASSESSMENT AND PLAN / ED COURSE  Left foot pain due to sprain. No evidence of fracture. Take Ibuprofen and Tylenol as needed. Follow-up with Dr. Deirdre Priest as needed. ____________________________________________  FINAL CLINICAL IMPRESSION(S) / ED DIAGNOSES  Final diagnoses:  Foot sprain, left, initial encounter     Lissa Hoard, PA-C 04/23/15 1957  Sharyn Creamer, MD 04/23/15 2352

## 2015-04-23 NOTE — ED Notes (Signed)
Tripped off curb today, pain to left foot

## 2015-04-23 NOTE — Discharge Instructions (Signed)
Foot Sprain The muscles and cord like structures which attach muscle to bone (tendons) that surround the feet are made up of units. A foot sprain can occur at the weakest spot in any of these units. This condition is most often caused by injury to or overuse of the foot, as from playing contact sports, or aggravating a previous injury, or from poor conditioning, or obesity. SYMPTOMS  Pain with movement of the foot.  Tenderness and swelling at the injury site.  Loss of strength is present in moderate or severe sprains. THE THREE GRADES OR SEVERITY OF FOOT SPRAIN ARE:  Mild (Grade I): Slightly pulled muscle without tearing of muscle or tendon fibers or loss of strength.  Moderate (Grade II): Tearing of fibers in a muscle, tendon, or at the attachment to bone, with small decrease in strength.  Severe (Grade III): Rupture of the muscle-tendon-bone attachment, with separation of fibers. Severe sprain requires surgical repair. Often repeating (chronic) sprains are caused by overuse. Sudden (acute) sprains are caused by direct injury or over-use. DIAGNOSIS  Diagnosis of this condition is usually by your own observation. If problems continue, a caregiver may be required for further evaluation and treatment. X-rays may be required to make sure there are not breaks in the bones (fractures) present. Continued problems may require physical therapy for treatment. PREVENTION  Use strength and conditioning exercises appropriate for your sport.  Warm up properly prior to working out.  Use athletic shoes that are made for the sport you are participating in.  Allow adequate time for healing. Early return to activities makes repeat injury more likely, and can lead to an unstable arthritic foot that can result in prolonged disability. Mild sprains generally heal in 3 to 10 days, with moderate and severe sprains taking 2 to 10 weeks. Your caregiver can help you determine the proper time required for  healing. HOME CARE INSTRUCTIONS   Apply ice to the injury for 15-20 minutes, 03-04 times per day. Put the ice in a plastic bag and place a towel between the bag of ice and your skin.  An elastic wrap (like an Ace bandage) may be used to keep swelling down.  Keep foot above the level of the heart, or at least raised on a footstool, when swelling and pain are present.  Try to avoid use other than gentle range of motion while the foot is painful. Do not resume use until instructed by your caregiver. Then begin use gradually, not increasing use to the point of pain. If pain does develop, decrease use and continue the above measures, gradually increasing activities that do not cause discomfort, until you gradually achieve normal use.  Use crutches if and as instructed, and for the length of time instructed.  Keep injured foot and ankle wrapped between treatments.  Massage foot and ankle for comfort and to keep swelling down. Massage from the toes up towards the knee.  Only take over-the-counter or prescription medicines for pain, discomfort, or fever as directed by your caregiver. SEEK IMMEDIATE MEDICAL CARE IF:   Your pain and swelling increase, or pain is not controlled with medications.  You have loss of feeling in your foot or your foot turns cold or blue.  You develop new, unexplained symptoms, or an increase of the symptoms that brought you to your caregiver. MAKE SURE YOU:   Understand these instructions.  Will watch your condition.  Will get help right away if you are not doing well or get worse. Document Released:  02/06/2002 Document Revised: 11/09/2011 Document Reviewed: 04/05/2008 [employer] Patient Information 2015 [place], [place]. This information is not intended to replace advice given to you by your health care provider. Make sure you discuss any questions you have with your health care provider.   Your exam and x-ray do not show any bony injury. Wear the post-op shoe as  needed for comfort. Take Tylenol and Motrin as needed. Follow-up with Dr. Deirdre Priest as needed.

## 2015-04-23 NOTE — ED Notes (Signed)
Pt states she was chasing her dog and stepped hard of the curb and heard a pop and felt pain on the top of her left foot, ambulatory to room, mild swelling noted

## 2015-07-03 ENCOUNTER — Ambulatory Visit (INDEPENDENT_AMBULATORY_CARE_PROVIDER_SITE_OTHER): Payer: Medicaid Other | Admitting: Family Medicine

## 2015-07-03 ENCOUNTER — Encounter: Payer: Self-pay | Admitting: Family Medicine

## 2015-07-03 VITALS — BP 138/77 | HR 86 | Temp 98.4°F | Ht 64.0 in | Wt 213.3 lb

## 2015-07-03 DIAGNOSIS — R1032 Left lower quadrant pain: Secondary | ICD-10-CM

## 2015-07-03 DIAGNOSIS — E7800 Pure hypercholesterolemia, unspecified: Secondary | ICD-10-CM

## 2015-07-03 DIAGNOSIS — R109 Unspecified abdominal pain: Secondary | ICD-10-CM | POA: Insufficient documentation

## 2015-07-03 MED ORDER — HYOSCYAMINE SULFATE 0.125 MG SL SUBL: 0.1250 mg | SUBLINGUAL_TABLET | Freq: Four times a day (QID) | SUBLINGUAL | Status: DC | PRN

## 2015-07-03 MED ORDER — ATORVASTATIN CALCIUM 80 MG PO TABS: 80.0000 mg | ORAL_TABLET | Freq: Every day | ORAL | Status: DC

## 2015-07-03 NOTE — Assessment & Plan Note (Signed)
Not at goal.  Will start Lipitor - has used in past

## 2015-07-03 NOTE — Patient Instructions (Signed)
Good to see you today!  Thanks for coming in.  For the abdomen pains - take a Levsin under your tongue every 6 hours as needed.  If the pains get worse or if you have fever come back. [facility]  For the cholesterol take lipitor 80 mg tabs once daily.  You will need to follow up blood test in 2-3 months.   If any jaundice or severe right upper stomach pain call us  Happy Holidays

## 2015-07-03 NOTE — Progress Notes (Signed)
   Subjective:    Patient ID: Haley Mclaughlin, female    DOB: 01/20/1966, 49 y.o.   MRN: [id number]  HPI  ABDOMINAL PAIN  Pain began on and off for months but is intermittenly worse with sharp sudden left lower abd pains  Medications tried: oxycodone helps Similar pain before:on and off Prior abdominal surgeries: not recenlty  Symptoms Nausea/vomiting: mild Diarrhea: has had for months  Constipation: no Blood in stool: no Blood in vomit: no Fever: no Dysuria: no Loss of appetite: no Weight loss: no  Vaginal Bleeding: no   Review of Symptoms - see HPI PMH - Smoking status noted.  [facility] and has colonoscopy scheduled in mid Nov  HYPERLIPIDEMIA Symptoms Chest pain on exertion:  no   Leg claudication:   no Medications (modifying factor): Compliance- has not been on lipitor recenlty but in past Right upper quadrant pain- no  Muscle aches- no Duration - years   Timing - continuous    Component Value Date/Time   CHOL 345* 01/09/2015 1040   TRIG 142 01/09/2015 1040   HDL 35* 01/09/2015 1040   VLDL 28 01/09/2015 1040   CHOLHDL 9.9 01/09/2015 1040     Review of Systems     Objective:   Physical Exam Alert nad No cvat  Abdomen: soft and organomegaly or hernias noted.  No guarding or rebound.  Tender mildly in LLQ no masses        Assessment & Plan:

## 2015-07-03 NOTE — Assessment & Plan Note (Signed)
Worsened chronic - No sx of infection or acute obstruction.  Treat as colonic spasms and [facility]

## 2015-08-30 ENCOUNTER — Encounter: Payer: Self-pay | Admitting: Emergency Medicine

## 2015-08-30 ENCOUNTER — Emergency Department
Admission: EM | Admit: 2015-08-30 | Discharge: 2015-08-30 | Disposition: A | Discharge status: Home / Self Care | Payer: Medicaid Other | Attending: Emergency Medicine | Admitting: Emergency Medicine

## 2015-08-30 DIAGNOSIS — Z79899 Other long term (current) drug therapy: Secondary | ICD-10-CM | POA: Diagnosis not present

## 2015-08-30 DIAGNOSIS — Z7951 Long term (current) use of inhaled steroids: Secondary | ICD-10-CM | POA: Diagnosis not present

## 2015-08-30 DIAGNOSIS — K047 Periapical abscess without sinus: Secondary | ICD-10-CM | POA: Diagnosis not present

## 2015-08-30 DIAGNOSIS — K0889 Other specified disorders of teeth and supporting structures: Secondary | ICD-10-CM | POA: Diagnosis present

## 2015-08-30 DIAGNOSIS — Z7982 Long term (current) use of aspirin: Secondary | ICD-10-CM | POA: Diagnosis not present

## 2015-08-30 MED ORDER — TRAMADOL HCL 50 MG PO TABS: 50.0000 mg | ORAL_TABLET | Freq: Four times a day (QID) | ORAL | Status: DC | PRN

## 2015-08-30 MED ORDER — MAGIC MOUTHWASH W/LIDOCAINE: 5.0000 mL | Freq: Four times a day (QID) | ORAL | Status: DC

## 2015-08-30 MED ORDER — AMOXICILLIN 875 MG PO TABS: 875.0000 mg | ORAL_TABLET | Freq: Two times a day (BID) | ORAL | Status: DC

## 2015-08-30 NOTE — ED Notes (Signed)
[id number].  SKIN WARM AND DRY.  NAD 

## 2015-08-30 NOTE — ED Provider Notes (Signed)
[facility] Emergency Department Provider Note ?  ? ____________________________________________ ? Time seen: 11:45 PM ? I have reviewed the triage vital signs and the nursing notes.  ________ HISTORY ? Chief Complaint Dental Pain     HPI  Haley Mclaughlin is a 49 y.o. female   who presents to the emergency department complaining of left upper canine tooth pain. She states that she had an abscess in same tooth approximately 3 months ago, was placed on antibiotics at that time, and was instructed to return to have tooth extracted. The patient after symptoms resolved she fell to return for tooth extraction. She now reports pain and swelling to the area. She denies any difficulty breathing or swallowing. She denies any headache, neck pain, chest pain or shortness of breath, nausea or vomiting, fevers or chills. ? ? ? Past Medical History  Diagnosis Date  . Hyperlipidemia   . Gestational diabetes     diet controlled with #3  . Preterm labor     with last 4 preg, del at term  . Urinary tract infection   . Abnormal Pap smear   . PID (pelvic inflammatory disease)   . Hyperlipemia   . Sarcoidosis [facility](HCC)     Patient Active Problem List   Diagnosis Date Noted  . Abdominal pain 07/03/2015  . Abnormal Pap smear of cervix 01/31/2015  . Hyperglycemia 06/05/2013  . 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 HYPERCHOLESTEROLEMIA 10/28/2006  . PANIC ATTACKS 10/28/2006  . PALPITATIONS 10/28/2006   ? Past Surgical History  Procedure Laterality Date  . Appendectomy    . Induced abortion     ? Current Outpatient Rx  Name  Route  Sig  Dispense  Refill  . acetaminophen (TYLENOL) 500 MG tablet   Oral   Take 1,000 mg by mouth every 6 (six) hours as needed for moderate pain.         Marland Kitchen. amoxicillin (AMOXIL) 875 MG tablet   Oral   Take 1 tablet (875 mg total) by mouth 2 (two) times daily.   14 tablet   0   . aspirin 81 MG tablet   Oral   Take 81 mg by mouth  daily.         Marland Kitchen. atorvastatin (LIPITOR) 80 MG tablet   Oral   Take 1 tablet (80 mg total) by mouth daily.   90 tablet   3   . fluticasone (FLONASE) 50 MCG/ACT nasal spray   Each Nare   Place 1 spray into both nostrils daily.          . hyoscyamine (LEVSIN SL) 0.125 MG SL tablet   Sublingual   Place 1 tablet (0.125 mg total) under the tongue every 6 (six) hours as needed.   30 tablet   0   . ibuprofen (ADVIL,MOTRIN) 800 MG tablet   Oral   Take 1 tablet (800 mg total) by mouth every 8 (eight) hours as needed for moderate pain.   15 tablet   0   . magic mouthwash w/lidocaine SOLN   Oral   Take 5 mLs by mouth 4 (four) times daily.   240 mL   0     Dispense in a 1/1/1/1 ratio. Use lidocaine, diphen ...   . omeprazole (PRILOSEC) 40 MG capsule   Oral   Take 40 mg by mouth.         . traMADol (ULTRAM) 50 MG tablet   Oral   Take 1 tablet (50 mg total)  by mouth every 6 (six) hours as needed.   20 tablet   0    ? Allergies Sulfamethoxazole-trimethoprim ? Family History  Problem Relation Age of Onset  . Heart disease Mother   . Hypertension Mother   . Diabetes Father   . Heart disease Father   . Anesthesia problems Neg Hx    ? Social History Social History  Substance Use Topics  . Smoking status: Never Smoker   . Smokeless tobacco: Never Used  . Alcohol Use: No   ? Review of Systems Constitutional: no fever. Eyes: no discharge ENT: no sore throat. Positive for dental pain. Cardiovascular: no chest pain. Respiratory: no cough. No sob Gastrointestinal: denies abdominal pain, vomiting, diarrhea, and constipation Genitourinary: no dysuria. Negative for hematuria Musculoskeletal: Negative for back pain. Skin: Negative for rash. Neurological: Negative for headaches  10-point ROS otherwise negative.  _______________ PHYSICAL EXAM: ? VITAL SIGNS:   ED Triage Vitals  Enc Vitals Group     BP 08/30/15 2207 186/86 mmHg     Pulse Rate 08/30/15 2207  104     Resp 08/30/15 2207 14     Temp 08/30/15 2207 98 F (36.7 C)     Temp Source 08/30/15 2207 Oral     SpO2 08/30/15 2207 98 %     Weight 08/30/15 2207 202 lb (91.627 kg)     Height 08/30/15 2207  (1.626 m)     Head Cir --      Peak Flow --      Pain Score 08/30/15 2208 7     Pain Loc --      Pain Edu? --      Excl. in GC? --    ?  Constitutional: Alert and oriented. Well appearing and in no distress. Eyes: Conjunctivae are normal.  ENT      Head: Normocephalic and atraumatic.      Ears:       Nose: No congestion/rhinnorhea.      Mouth/Throat: Mucous membranes are moist.  patient has poor dentition throughout mouth. Incisor tooth left upper jaw with significant erosion. Surrounding erythema and edema. No pus drainage and no fluctuance to palpation. Hematological/Lymphatic/Immunilogical: No cervical lymphadenopathy. Cardiovascular: Normal rate, regular rhythm. Normal S1 and S2. Respiratory: Normal respiratory effort without tachypnea nor retractions. Lungs CTAB. Gastrointestinal: Soft and nontender. No distention. There is no CVA tenderness. Genitourinary:  Musculoskeletal: Nontender with normal range of motion in all extremities.  Neurologic:  Normal speech and language. No gross focal neurologic deficits are appreciated. Skin:  Skin is warm, dry and intact. No rash noted. Psychiatric: Mood and affect are normal. Speech and behavior are normal. Patient exhibits appropriate insight and judgment.    LABS (all labs ordered are listed, but only abnormal results are displayed)  Labs Reviewed - No data to display  ___________ RADIOLOGY    _____________ PROCEDURES ? Procedure(s) performed:    Medications - No data to display  ______________________________________________________ INITIAL IMPRESSION / ASSESSMENT AND PLAN / ED COURSE ? Pertinent labs & imaging results that were available during my care of the patient were reviewed by me and considered in my  medical decision making (see chart for details).    Patient's diagnosis is consistent with dental abscess to the left upper incisor tooth. The patient will be placed on antibiotics, Magic mouthwash, and limited amount of narcotics for pain control. Patient is instructed to follow-up with dentist after finishing antibiotics for tooth extraction. Patient verbalizes understanding of her diagnosis and  treatment plan and verbalizes she will have tooth extracted after finishing antibiotics.    New Prescriptions   AMOXICILLIN (AMOXIL) 875 MG TABLET    Take 1 tablet (875 mg total) by mouth 2 (two) times daily.   MAGIC MOUTHWASH W/LIDOCAINE SOLN    Take 5 mLs by mouth 4 (four) times daily.   TRAMADOL (ULTRAM) 50 MG TABLET    Take 1 tablet (50 mg total) by mouth every 6 (six) hours as needed.   ____________________________________________ FINAL CLINICAL IMPRESSION(S) / ED DIAGNOSES?  Final diagnoses:  Dental abscess     Racheal Patches, PA-C 08/30/15 2345  Phineas Semen, MD 08/31/15 0001

## 2015-08-30 NOTE — ED Notes (Signed)
Per pt her dentist prescribed penicillin for an abscess on her left top canine tooth. Per pt pain is now radiating upwards into her jaw. Pt is in NAD at this time.

## 2015-08-30 NOTE — Discharge Instructions (Signed)
Dental Abscess °A dental abscess is a collection of pus in or around a tooth. °CAUSES °This condition is caused by a bacterial infection around the root of the tooth that involves the inner part of the tooth (pulp). It may result from: °· Severe tooth decay. °· Trauma to the tooth that allows bacteria to enter into the pulp, such as a broken or chipped tooth. °· Severe gum disease around a tooth. °SYMPTOMS °Symptoms of this condition include: °· Severe pain in and around the infected tooth. °· Swelling and redness around the infected tooth, in the mouth, or in the face. °· Tenderness. °· Pus drainage. °· Bad breath. °· Bitter taste in the mouth. °· Difficulty swallowing. °· Difficulty opening the mouth. °· Nausea. °· Vomiting. °· Chills. °· Swollen neck glands. °· Fever. °DIAGNOSIS °This condition is diagnosed with examination of the infected tooth. During the exam, your dentist may tap on the infected tooth. Your dentist will also ask about your medical and dental history and may order X-rays. °TREATMENT °This condition is treated by eliminating the infection. This may be done with: °· Antibiotic medicine. °· A root canal. This may be performed to save the tooth. °· Pulling (extracting) the tooth. This may also involve draining the abscess. This is done if the tooth cannot be saved. °HOME CARE INSTRUCTIONS °· Take medicines only as directed by your dentist. °· If you were prescribed antibiotic medicine, finish all of it even if you start to feel better. °· Rinse your mouth (gargle) often with salt water to relieve pain or swelling. °· Do not drive or operate heavy machinery while taking pain medicine. °· Do not apply heat to the outside of your mouth. °· Keep all follow-up visits as directed by your dentist. This is important. °SEEK MEDICAL CARE IF: °· Your pain is worse and is not helped by medicine. °SEEK IMMEDIATE MEDICAL CARE IF: °· You have a fever or chills. °· Your symptoms suddenly get worse. °· You have a  very bad headache. °· You have problems breathing or swallowing. °· You have trouble opening your mouth. °· You have swelling in your neck or around your eye. °  °This information is not intended to replace advice given to you by your health care provider. Make sure you discuss any questions you have with your health care provider. °  °Document Released: 08/17/2005 Document Revised: 01/01/2015 Document Reviewed: 08/14/2014 °Elsevier Interactive Patient Education ©2016 [employer] ° °Dental Pain °Dental pain may be caused by many things, including: °· Tooth decay (cavities or caries). Cavities expose the nerve of your tooth to air and hot or cold temperatures. This can cause pain or discomfort. °· Abscess or infection. A dental abscess is a collection of infected pus from a bacterial infection in the inner part of the tooth (pulp). It usually occurs at the end of the tooth's root. °· Injury. °· An unknown reason (idiopathic). °Your pain may be mild or severe. It may only occur when: °· You are chewing. °· You are exposed to hot or cold temperature. °· You are eating or drinking sugary foods or beverages, such as soda or candy. °Your pain may also be constant. °HOME CARE INSTRUCTIONS °Watch your dental pain for any changes. The following actions may help to lessen any discomfort that you are feeling: °· Take medicines only as directed by your dentist. °· If you were prescribed an antibiotic medicine, finish all of it even if you start to feel better. °· Keep all   follow-up visits as directed by your dentist. This is important. °· Do not apply heat to the outside of your face. °· Rinse your mouth or gargle with salt water if directed by your dentist. This helps with pain and swelling. °¨ You can make salt water by adding ¼ tsp of salt to 1 cup of warm water. °· Apply ice to the painful area of your face: °¨ Put ice in a plastic bag. °¨ Place a towel between your skin and the bag. °¨ Leave the ice on for 20 minutes,  2-3 times per day. °· Avoid foods or drinks that cause you pain, such as: °¨ Very hot or very cold foods or drinks. °¨ Sweet or sugary foods or drinks. °SEEK MEDICAL CARE IF: °· Your pain is not controlled with medicines. °· Your symptoms are worse. °· You have new symptoms. °SEEK IMMEDIATE MEDICAL CARE IF: °· You are unable to open your mouth. °· You are having trouble breathing or swallowing. °· You have a fever. °· Your face, neck, or jaw is swollen. °  °This information is not intended to replace advice given to you by your health care provider. Make sure you discuss any questions you have with your health care provider. °  °Document Released: 08/17/2005 Document Revised: 01/01/2015 Document Reviewed: 08/13/2014 °Elsevier Interactive Patient Education ©2016 [employer] ° °

## 2015-09-22 ENCOUNTER — Encounter: Payer: Self-pay | Admitting: Emergency Medicine

## 2015-09-22 ENCOUNTER — Emergency Department
Admission: EM | Admit: 2015-09-22 | Discharge: 2015-09-22 | Disposition: A | Discharge status: Home / Self Care | Payer: Medicaid Other | Attending: Emergency Medicine | Admitting: Emergency Medicine

## 2015-09-22 ENCOUNTER — Emergency Department: Payer: Medicaid Other

## 2015-09-22 DIAGNOSIS — IMO0001 Reserved for inherently not codable concepts without codable children: Secondary | ICD-10-CM

## 2015-09-22 DIAGNOSIS — Z79899 Other long term (current) drug therapy: Secondary | ICD-10-CM | POA: Insufficient documentation

## 2015-09-22 DIAGNOSIS — Z792 Long term (current) use of antibiotics: Secondary | ICD-10-CM | POA: Diagnosis not present

## 2015-09-22 DIAGNOSIS — R030 Elevated blood-pressure reading, without diagnosis of hypertension: Secondary | ICD-10-CM | POA: Diagnosis not present

## 2015-09-22 DIAGNOSIS — Z7982 Long term (current) use of aspirin: Secondary | ICD-10-CM | POA: Insufficient documentation

## 2015-09-22 DIAGNOSIS — R51 Headache: Secondary | ICD-10-CM | POA: Diagnosis present

## 2015-09-22 LAB — URINALYSIS COMPLETE WITH MICROSCOPIC (ARMC ONLY)
BILIRUBIN URINE: NEGATIVE
GLUCOSE, UA: 50 mg/dL — AB
KETONES UR: NEGATIVE mg/dL
Leukocytes, UA: NEGATIVE
Nitrite: NEGATIVE
Protein, ur: NEGATIVE mg/dL
Specific Gravity, Urine: 1.025 (ref 1.005–1.030)
pH: 5 (ref 5.0–8.0)

## 2015-09-22 LAB — BASIC METABOLIC PANEL
ANION GAP: 7 (ref 5–15)
BUN: 14 mg/dL (ref 6–20)
CO2: 27 mmol/L (ref 22–32)
Calcium: 9.3 mg/dL (ref 8.9–10.3)
Chloride: 105 mmol/L (ref 101–111)
Creatinine, Ser: 0.68 mg/dL (ref 0.44–1.00)
GFR calc Af Amer: >60 mL/min (ref >60)
GFR calc non Af Amer: >60 mL/min (ref >60)
Glucose, Bld: 209 mg/dL — ABNORMAL HIGH (ref 65–99)
Potassium: 3.8 mmol/L (ref 3.5–5.1)
Sodium: 139 mmol/L (ref 135–145)

## 2015-09-22 LAB — TROPONIN I

## 2015-09-22 LAB — CBC
HEMATOCRIT: 39.2 % (ref 35.0–47.0)
HEMOGLOBIN: 13.1 g/dL (ref 12.0–16.0)
MCH: 29.8 pg (ref 26.0–34.0)
MCHC: 33.4 g/dL (ref 32.0–36.0)
MCV: 89.3 fL (ref 80.0–100.0)
Platelets: 248 10*3/uL (ref 150–440)
RBC: 4.39 MIL/uL (ref 3.80–5.20)
RDW: 13.3 % (ref 11.5–14.5)
WBC: 7.8 10*3/uL (ref 3.6–11.0)

## 2015-09-22 NOTE — ED Notes (Addendum)
Pt says she's been checking her blood pressure all day today; readings at home were 176/108, 186/107; face flushed; headache; vision blurry; felt dizzy; pt says currently she has a mild headache and feels a little weak; pt with no history of HTN but was feeling bad at a friend's house and checked her blood pressure then; pt ambulatory with steady gait; talking in complete coherent sentences

## 2015-09-22 NOTE — ED Provider Notes (Signed)
[facility] Emergency Department Provider Note  ____________________________________________  Time seen: 0530  I have reviewed the triage vital signs and the nursing notes.   HISTORY  Chief Complaint Hypertension and Headache   History limited by: Not Limited   HPI Haley Mclaughlin is a 50 y.o. female who presents to the emergency department today because of concern for elevated blood pressure and headache. The patient states that her blood pressure is been elevated over the past week. She states that he does not have a history of high blood pressure. She states that today she started having a headache. It was described on the left side of her head. She did have some accompanying tingling. She denies any chest pain or shortness breath. Denies any fevers.     Past Medical History  Diagnosis Date  . Hyperlipidemia   . Gestational diabetes     diet controlled with #3  . Preterm labor     with last 4 preg, del at term  . Urinary tract infection   . Abnormal Pap smear   . PID (pelvic inflammatory disease)   . Hyperlipemia   . Sarcoidosis [facility])     Patient Active Problem List   Diagnosis Date Noted  . Abdominal pain 07/03/2015  . Abnormal Pap smear of cervix 01/31/2015  . Hyperglycemia 06/05/2013  . 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 HYPERCHOLESTEROLEMIA 10/28/2006  . PANIC ATTACKS 10/28/2006  . PALPITATIONS 10/28/2006    Past Surgical History  Procedure Laterality Date  . Appendectomy    . Induced abortion      Current Outpatient Rx  Name  Route  Sig  Dispense  Refill  . aspirin 81 MG tablet   Oral   Take 81 mg by mouth daily.         . fluticasone (FLONASE) 50 MCG/ACT nasal spray   Each Nare   Place 1 spray into both nostrils daily as needed.          Marland Kitchen omeprazole (PRILOSEC) 40 MG capsule   Oral   Take 40 mg by mouth.         Marland Kitchen acetaminophen (TYLENOL) 500 MG tablet   Oral   Take 1,000 mg by mouth every 6 (six) hours as  needed for moderate pain.         Marland Kitchen amoxicillin (AMOXIL) 875 MG tablet   Oral   Take 1 tablet (875 mg total) by mouth 2 (two) times daily.   14 tablet   0   . atorvastatin (LIPITOR) 80 MG tablet   Oral   Take 1 tablet (80 mg total) by mouth daily.   90 tablet   3   . hyoscyamine (LEVSIN SL) 0.125 MG SL tablet   Sublingual   Place 1 tablet (0.125 mg total) under the tongue every 6 (six) hours as needed.   30 tablet   0   . ibuprofen (ADVIL,MOTRIN) 800 MG tablet   Oral   Take 1 tablet (800 mg total) by mouth every 8 (eight) hours as needed for moderate pain.   15 tablet   0   . magic mouthwash w/lidocaine SOLN   Oral   Take 5 mLs by mouth 4 (four) times daily.   240 mL   0     Dispense in a 1/1/1/1 ratio. Use lidocaine, diphen ...   . traMADol (ULTRAM) 50 MG tablet   Oral   Take 1 tablet (50 mg total) by mouth every 6 (six) hours as  needed.   20 tablet   0     Allergies Sulfamethoxazole-trimethoprim  Family History  Problem Relation Age of Onset  . Heart disease Mother   . Hypertension Mother   . Diabetes Father   . Heart disease Father   . Anesthesia problems Neg Hx     Social History Social History  Substance Use Topics  . Smoking status: Never Smoker   . Smokeless tobacco: Never Used  . Alcohol Use: No    Review of Systems  Constitutional: Negative for fever. Cardiovascular: Negative for chest pain. Respiratory: Negative for shortness of breath. Gastrointestinal: Negative for abdominal pain, vomiting and diarrhea. Neurological: Positive for headache.  10-point ROS otherwise negative.  ____________________________________________   PHYSICAL EXAM:  VITAL SIGNS: ED Triage Vitals  Enc Vitals Group     BP 09/22/15 0128 146/90 mmHg     Pulse Rate 09/22/15 0128 93     Resp 09/22/15 0128 18     Temp 09/22/15 0128 98.2 F (36.8 C)     Temp Source 09/22/15 0128 Oral     SpO2 09/22/15 0128 96 %     Weight 09/22/15 0128 210 lb (95.255 kg)      Height 09/22/15 0128  (1.626 m)     Head Cir --      Peak Flow --      Pain Score 09/22/15 0129 2   Constitutional: Alert and oriented. Well appearing and in no distress. Eyes: Conjunctivae are normal. PERRL. Normal extraocular movements. ENT   Head: Normocephalic and atraumatic.   Nose: No congestion/rhinnorhea.   Mouth/Throat: Mucous membranes are moist.   Neck: No stridor. Hematological/Lymphatic/Immunilogical: No cervical lymphadenopathy. Cardiovascular: Normal rate, regular rhythm.  No murmurs, rubs, or gallops. Respiratory: Normal respiratory effort without tachypnea nor retractions. Breath sounds are clear and equal bilaterally. No wheezes/rales/rhonchi. Gastrointestinal: Soft and nontender. No distention.  Genitourinary: Deferred Musculoskeletal: Normal range of motion in all extremities. No joint effusions.  No lower extremity tenderness nor edema. Neurologic:  Normal speech and language. No gross focal neurologic deficits are appreciated.  Skin:  Skin is warm, dry and intact. No rash noted. Psychiatric: Mood and affect are normal. Speech and behavior are normal. Patient exhibits appropriate insight and judgment.  ____________________________________________    LABS (pertinent positives/negatives)  Labs Reviewed  BASIC METABOLIC PANEL - Abnormal; Notable for the following:    Glucose, Bld 209 (*)    All other components within normal limits  URINALYSIS COMPLETEWITH MICROSCOPIC ([facility] ONLY) - Abnormal; Notable for the following:    Color, Urine YELLOW (*)    APPearance HAZY (*)    Glucose, UA 50 (*)    Hgb urine dipstick 3+ (*)    Bacteria, UA RARE (*)    Squamous Epithelial / LPF 0-5 (*)    All other components within normal limits  CBC  TROPONIN I     ____________________________________________   EKG  I, Phineas Semen, attending physician, personally viewed and interpreted this EKG  EKG Time: 0137 Rate: 79 Rhythm: normal sinus  rhythm Axis: normal Intervals: qtc 467 QRS: narrow ST changes: no st elevation Impression: normal ekg ____________________________________________    RADIOLOGY  CT head IMPRESSION: Unremarkable noncontrast CT of the head.  CXR  IMPRESSION: No active cardiopulmonary disease.  ____________________________________________   PROCEDURES  Procedure(s) performed: None  Critical Care performed: No  ____________________________________________   INITIAL IMPRESSION / ASSESSMENT AND PLAN / ED COURSE  Pertinent labs & imaging results that were available during my care of  the patient were reviewed by me and considered in my medical decision making (see chart for details).  Patient presented to the emergency department today because of concerns for headache and high blood pressure. The time my examination the patient's blood pressure had improved. She did state that the headache also improved. I had a discussion with her about high blood pressure. I discussed importance of following up with primary care physician.  ____________________________________________   FINAL CLINICAL IMPRESSION(S) / ED DIAGNOSES  Final diagnoses:  Elevated blood pressure     Phineas Semen, MD 09/22/15 [phone]

## 2015-09-22 NOTE — ED Notes (Signed)
Dr. Goodman at bedside.  

## 2015-09-22 NOTE — Discharge Instructions (Signed)
Please seek medical attention for any high fevers, chest pain, shortness of breath, change in behavior, persistent vomiting, bloody stool or any other new or concerning symptoms. ° ° °Hypertension °Hypertension, commonly called high blood pressure, is when the force of blood pumping through your arteries is too strong. Your arteries are the blood vessels that carry blood from your heart throughout your body. A blood pressure reading consists of a higher number over a lower number, such as 110/72. The higher number (systolic) is the pressure inside your arteries when your heart pumps. The lower number (diastolic) is the pressure inside your arteries when your heart relaxes. Ideally you want your blood pressure below 120/80. °Hypertension forces your heart to work harder to pump blood. Your arteries may become narrow or stiff. Having untreated or uncontrolled hypertension can cause heart attack, stroke, kidney disease, and other problems. °RISK FACTORS °Some risk factors for high blood pressure are controllable. Others are not.  °Risk factors you cannot control include:  °· Race. You may be at higher risk if you are African American. °· Age. Risk increases with age. °· Gender. Men are at higher risk than women before age 45 years. After age 65, women are at higher risk than men. °Risk factors you can control include: °· Not getting enough exercise or physical activity. °· Being overweight. °· Getting too much fat, sugar, calories, or salt in your diet. °· Drinking too much alcohol. °SIGNS AND SYMPTOMS °Hypertension does not usually cause signs or symptoms. Extremely high blood pressure (hypertensive crisis) may cause headache, anxiety, shortness of breath, and nosebleed. °DIAGNOSIS °To check if you have hypertension, your health care provider will measure your blood pressure while you are seated, with your arm held at the level of your heart. It should be measured at least twice using the same arm. Certain conditions  can cause a difference in blood pressure between your right and left arms. A blood pressure reading that is higher than normal on one occasion does not mean that you need treatment. If it is not clear whether you have high blood pressure, you may be asked to return on a different day to have your blood pressure checked again. Or, you may be asked to monitor your blood pressure at home for 1 or more weeks. °TREATMENT °Treating high blood pressure includes making lifestyle changes and possibly taking medicine. Living a healthy lifestyle can help lower high blood pressure. You may need to change some of your habits. °Lifestyle changes may include: °· Following the DASH diet. This diet is high in fruits, vegetables, and whole grains. It is low in salt, red meat, and added sugars. °· Keep your sodium intake below 2,300 mg per day. °· Getting at least 30-45 minutes of aerobic exercise at least 4 times per week. °· Losing weight if necessary. °· Not smoking. °· Limiting alcoholic beverages. °· Learning ways to reduce stress. °Your health care provider may prescribe medicine if lifestyle changes are not enough to get your blood pressure under control, and if one of the following is true: °· You are 18-59 years of age and your systolic blood pressure is above 140. °· You are 60 years of age or older, and your systolic blood pressure is above 150. °· Your diastolic blood pressure is above 90. °· You have diabetes, and your systolic blood pressure is over 140 or your diastolic blood pressure is over 90. °· You have kidney disease and your blood pressure is above 140/90. °· You have   heart disease and your blood pressure is above 140/90. °Your personal target blood pressure may vary depending on your medical conditions, your age, and other factors. °HOME CARE INSTRUCTIONS °· Have your blood pressure rechecked as directed by your health care provider.   °· Take medicines only as directed by your health care provider. Follow the  directions carefully. Blood pressure medicines must be taken as prescribed. The medicine does not work as well when you skip doses. Skipping doses also puts you at risk for problems. °· Do not smoke.   °· Monitor your blood pressure at home as directed by your health care provider.  °SEEK MEDICAL CARE IF:  °· You think you are having a reaction to medicines taken. °· You have recurrent headaches or feel dizzy. °· You have swelling in your ankles. °· You have trouble with your vision. °SEEK IMMEDIATE MEDICAL CARE IF: °· You develop a severe headache or confusion. °· You have unusual weakness, numbness, or feel faint. °· You have severe chest or abdominal pain. °· You vomit repeatedly. °· You have trouble breathing. °MAKE SURE YOU:  °· Understand these instructions. °· Will watch your condition. °· Will get help right away if you are not doing well or get worse. °  °This information is not intended to replace advice given to you by your health care provider. Make sure you discuss any questions you have with your health care provider. °  °Document Released: 08/17/2005 Document Revised: 01/01/2015 Document Reviewed: 06/09/2013 °Elsevier Interactive Patient Education ©2016 Elsevier Inc. ° °

## 2016-01-25 ENCOUNTER — Emergency Department: Payer: Medicaid Other

## 2016-01-25 ENCOUNTER — Emergency Department
Admission: EM | Admit: 2016-01-25 | Discharge: 2016-01-25 | Disposition: A | Discharge status: Home / Self Care | Payer: Medicaid Other | Attending: Emergency Medicine | Admitting: Emergency Medicine

## 2016-01-25 ENCOUNTER — Encounter: Payer: Self-pay | Admitting: Emergency Medicine

## 2016-01-25 DIAGNOSIS — M940 Chondrocostal junction syndrome [Tietze]: Secondary | ICD-10-CM | POA: Diagnosis not present

## 2016-01-25 DIAGNOSIS — E785 Hyperlipidemia, unspecified: Secondary | ICD-10-CM | POA: Insufficient documentation

## 2016-01-25 DIAGNOSIS — Z7982 Long term (current) use of aspirin: Secondary | ICD-10-CM | POA: Diagnosis not present

## 2016-01-25 DIAGNOSIS — Z79899 Other long term (current) drug therapy: Secondary | ICD-10-CM | POA: Diagnosis not present

## 2016-01-25 DIAGNOSIS — R091 Pleurisy: Secondary | ICD-10-CM | POA: Diagnosis present

## 2016-01-25 LAB — COMPREHENSIVE METABOLIC PANEL
ALBUMIN: 4.2 g/dL (ref 3.5–5.0)
ALT: 22 U/L (ref 14–54)
AST: 23 U/L (ref 15–41)
Alkaline Phosphatase: 57 U/L (ref 38–126)
Anion gap: 7 (ref 5–15)
BILIRUBIN TOTAL: 0.3 mg/dL (ref 0.3–1.2)
BUN: 11 mg/dL (ref 6–20)
CHLORIDE: 108 mmol/L (ref 101–111)
CO2: 24 mmol/L (ref 22–32)
CREATININE: 0.7 mg/dL (ref 0.44–1.00)
Calcium: 9.2 mg/dL (ref 8.9–10.3)
GFR calc Af Amer: >60 mL/min (ref >60)
GFR calc non Af Amer: >60 mL/min (ref >60)
GLUCOSE: 166 mg/dL — AB (ref 65–99)
POTASSIUM: 3.7 mmol/L (ref 3.5–5.1)
Sodium: 139 mmol/L (ref 135–145)
Total Protein: 7.9 g/dL (ref 6.5–8.1)

## 2016-01-25 LAB — CBC
HEMATOCRIT: 40.7 % (ref 35.0–47.0)
Hemoglobin: 13.7 g/dL (ref 12.0–16.0)
MCH: 29.5 pg (ref 26.0–34.0)
MCHC: 33.6 g/dL (ref 32.0–36.0)
MCV: 87.7 fL (ref 80.0–100.0)
PLATELETS: 286 10*3/uL (ref 150–440)
RBC: 4.64 MIL/uL (ref 3.80–5.20)
RDW: 13.1 % (ref 11.5–14.5)
WBC: 8.7 10*3/uL (ref 3.6–11.0)

## 2016-01-25 LAB — TROPONIN I: Troponin I: <0.03 ng/mL (ref <0.031)

## 2016-01-25 LAB — FIBRIN DERIVATIVES D-DIMER (ARMC ONLY): Fibrin derivatives D-dimer (ARMC): 371 (ref 0–499)

## 2016-01-25 MED ORDER — NAPROXEN 500 MG PO TABS: 500.0000 mg | ORAL_TABLET | Freq: Two times a day (BID) | ORAL | Status: DC

## 2016-01-25 NOTE — Discharge Instructions (Signed)
Costochondritis  Costochondritis is a condition in which the tissue (cartilage) that connects your ribs with your breastbone (sternum) becomes irritated. It causes pain in the chest and rib area. It usually goes away on its own over time.  HOME CARE  · Avoid activities that wear you out.  · Do not strain your ribs. Avoid activities that use your:    Chest.    Belly.    Side muscles.  · Put ice on the area for the first 2 days after the pain starts.    Put ice in a plastic bag.    Place a towel between your skin and the bag.    Leave the ice on for 20 minutes, 2-3 times a day.  · Only take medicine as told by your doctor.  GET HELP IF:  · You have redness or puffiness (swelling) in the rib area.  · Your pain does not go away with rest or medicine.  GET HELP RIGHT AWAY IF:   · Your pain gets worse.  · You are very uncomfortable.  · You have trouble breathing.  · You cough up blood.  · You start sweating or throwing up (vomiting).  · You have a fever or lasting symptoms for more than 2-3 days.  · You have a fever and your symptoms suddenly get worse.  MAKE SURE YOU:   · Understand these instructions.  · Will watch your condition.  · Will get help right away if you are not doing well or get worse.     This information is not intended to replace advice given to you by your health care provider. Make sure you discuss any questions you have with your health care provider.     Document Released: 02/03/2008 Document Revised: 04/19/2013 Document Reviewed: 03/21/2013  Elsevier Interactive Patient Education ©2016 Elsevier Inc.

## 2016-01-25 NOTE — ED Notes (Signed)
E signature pad not working 

## 2016-01-25 NOTE — ED Provider Notes (Signed)
[facility] Emergency Department Provider Note  ____________________________________________    I have reviewed the triage vital signs and the nursing notes.   HISTORY  Chief Complaint Pleurisy    HPI Haley Mclaughlin is a 50 y.o. female who presents with complaints of central chest pain  with inspirationthat started yesterday. She reports over the last 2 weeks she has been fighting upper respiratory infection with significant coughing. She denies recent travel. No calf pain or swelling. No history of blood clots. No fevers or chills. She does not smoke.     Past Medical History  Diagnosis Date  . Hyperlipidemia   . Gestational diabetes     diet controlled with #3  . Preterm labor     with last 4 preg, del at term  . Urinary tract infection   . Abnormal Pap smear   . PID (pelvic inflammatory disease)   . Hyperlipemia   . Sarcoidosis [facility])     Patient Active Problem List   Diagnosis Date Noted  . Abdominal pain 07/03/2015  . Abnormal Pap smear of cervix 01/31/2015  . Hyperglycemia 06/05/2013  . 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 HYPERCHOLESTEROLEMIA 10/28/2006  . PANIC ATTACKS 10/28/2006  . PALPITATIONS 10/28/2006    Past Surgical History  Procedure Laterality Date  . Appendectomy    . Induced abortion      Current Outpatient Rx  Name  Route  Sig  Dispense  Refill  . acetaminophen (TYLENOL) 500 MG tablet   Oral   Take 1,000 mg by mouth every 6 (six) hours as needed for moderate pain.         Marland Kitchen amoxicillin (AMOXIL) 875 MG tablet   Oral   Take 1 tablet (875 mg total) by mouth 2 (two) times daily.   14 tablet   0   . aspirin 81 MG tablet   Oral   Take 81 mg by mouth daily.         Marland Kitchen atorvastatin (LIPITOR) 80 MG tablet   Oral   Take 1 tablet (80 mg total) by mouth daily.   90 tablet   3   . fluticasone (FLONASE) 50 MCG/ACT nasal spray   Each Nare   Place 1 spray into both nostrils daily as needed.          .  hyoscyamine (LEVSIN SL) 0.125 MG SL tablet   Sublingual   Place 1 tablet (0.125 mg total) under the tongue every 6 (six) hours as needed.   30 tablet   0   . ibuprofen (ADVIL,MOTRIN) 800 MG tablet   Oral   Take 1 tablet (800 mg total) by mouth every 8 (eight) hours as needed for moderate pain.   15 tablet   0   . magic mouthwash w/lidocaine SOLN   Oral   Take 5 mLs by mouth 4 (four) times daily.   240 mL   0     Dispense in a 1/1/1/1 ratio. Use lidocaine, diphen ...   . omeprazole (PRILOSEC) 40 MG capsule   Oral   Take 40 mg by mouth.         . traMADol (ULTRAM) 50 MG tablet   Oral   Take 1 tablet (50 mg total) by mouth every 6 (six) hours as needed.   20 tablet   0     Allergies Sulfamethoxazole-trimethoprim  Family History  Problem Relation Age of Onset  . Heart disease Mother   . Hypertension Mother   . Diabetes  Father   . Heart disease Father   . Anesthesia problems Neg Hx     Social History Social History  Substance Use Topics  . Smoking status: Never Smoker   . Smokeless tobacco: Never Used  . Alcohol Use: No    Review of Systems  Constitutional: Negative for fever. Eyes: Negative for redness ENT: Negative for sore throat Cardiovascular: Pleurisy as above Respiratory: Negative for shortness of breath. Cough as above Gastrointestinal: Negative for abdominal pain Genitourinary: Negative for dysuria. Musculoskeletal: Negative for back pain. Skin: Negative for rash. Neurological: Negative for focal weakness Psychiatric: no anxiety    ____________________________________________   PHYSICAL EXAM:  VITAL SIGNS: ED Triage Vitals  Enc Vitals Group     BP 01/25/16 1408 159/105 mmHg     Pulse Rate 01/25/16 1408 102     Resp 01/25/16 1408 20     Temp 01/25/16 1408 99 F (37.2 C)     Temp Source 01/25/16 1408 Oral     SpO2 01/25/16 1408 96 %     Weight 01/25/16 1408 205 lb (92.987 kg)     Height 01/25/16 1408 5\' 4"  (1.626 m)     Head Cir  --      Peak Flow --      Pain Score --      Pain Loc --      Pain Edu? --      Excl. in GC? --     Constitutional: Alert and oriented. Well appearing and in no distress.  Eyes: Conjunctivae are normal. No erythema or injection ENT   Head: Normocephalic and atraumatic.   Mouth/Throat: Mucous membranes are moist. Cardiovascular: Normal rate, regular rhythm. Normal and symmetric distal pulses are present in the upper extremities.  Respiratory: Normal respiratory effort without tachypnea nor retractions. Breath sounds are clear and equal bilaterally.  Gastrointestinal: Soft and non-tender in all quadrants. No distention.  Genitourinary: deferred Musculoskeletal: Nontender with normal range of motion in all extremities. No lower extremity tenderness nor edema. Neurologic:  Normal speech and language. No gross focal neurologic deficits are appreciated. Skin:  Skin is warm, dry and intact. No rash noted. Psychiatric: Mood and affect are normal. Patient exhibits appropriate insight and judgment.  ____________________________________________    LABS (pertinent positives/negatives)  Labs Reviewed  COMPREHENSIVE METABOLIC PANEL - Abnormal; Notable for the following:    Glucose, Bld 166 (*)    All other components within normal limits  CBC  TROPONIN I  FIBRIN DERIVATIVES D-DIMER ([facility] ONLY)    ____________________________________________   EKG  ED ECG REPORT I, Jene EveryKINNER, Marleigh Kaylor, the attending physician, personally viewed and interpreted this ECG.  Date: 01/25/2016 EKG Time: 2:17 PM Rate: 103 Rhythm: Sinus tachycardia QRS Axis: normal Intervals: normal ST/T Wave abnormalities: normal Conduction Disturbances: none Narrative Interpretation: Sinus tachycardia   ____________________________________________    RADIOLOGY  Chest x-ray unremarkable  ____________________________________________   PROCEDURES  Procedure(s) performed: none  Critical Care performed:  none  ____________________________________________   INITIAL IMPRESSION / ASSESSMENT AND PLAN / ED COURSE  Pertinent labs & imaging results that were available during my care of the patient were reviewed by me and considered in my medical decision making (see chart for details).  Patient well-appearing and in no distress. She reports mild pleurisy with cough and inspiration. No history of blood clots, feel PE is unlikely however she is mildly tachycardic, we will send d-dimer and reevaluate.  ----------------------------------------- 5:20 PM on 01/25/2016 -----------------------------------------  D-dimer normal. Patient well-appearing we will discharge with  NSAIDs PCP follow-up as needed. Return precautions discussed. ____________________________________________   FINAL CLINICAL IMPRESSION(S) / ED DIAGNOSES  Final diagnoses:  Costochondritis, acute          Jene Every, MD 01/25/16 1720

## 2016-01-25 NOTE — ED Notes (Signed)
R chest pain with inspiration since yesterday, cough x 2 weeks.

## 2016-03-09 ENCOUNTER — Emergency Department: Payer: Medicaid Other

## 2016-03-09 ENCOUNTER — Emergency Department
Admission: EM | Admit: 2016-03-09 | Discharge: 2016-03-09 | Disposition: A | Discharge status: Home / Self Care | Payer: Medicaid Other | Attending: Emergency Medicine | Admitting: Emergency Medicine

## 2016-03-09 ENCOUNTER — Encounter: Payer: Self-pay | Admitting: Emergency Medicine

## 2016-03-09 DIAGNOSIS — Y999 Unspecified external cause status: Secondary | ICD-10-CM | POA: Insufficient documentation

## 2016-03-09 DIAGNOSIS — X501XXA Overexertion from prolonged static or awkward postures, initial encounter: Secondary | ICD-10-CM | POA: Diagnosis not present

## 2016-03-09 DIAGNOSIS — Z7982 Long term (current) use of aspirin: Secondary | ICD-10-CM | POA: Insufficient documentation

## 2016-03-09 DIAGNOSIS — E785 Hyperlipidemia, unspecified: Secondary | ICD-10-CM | POA: Insufficient documentation

## 2016-03-09 DIAGNOSIS — M79645 Pain in left finger(s): Secondary | ICD-10-CM | POA: Diagnosis present

## 2016-03-09 DIAGNOSIS — Z7951 Long term (current) use of inhaled steroids: Secondary | ICD-10-CM | POA: Insufficient documentation

## 2016-03-09 DIAGNOSIS — Y929 Unspecified place or not applicable: Secondary | ICD-10-CM | POA: Diagnosis not present

## 2016-03-09 DIAGNOSIS — S63613A Unspecified sprain of left middle finger, initial encounter: Secondary | ICD-10-CM | POA: Diagnosis not present

## 2016-03-09 DIAGNOSIS — Y939 Activity, unspecified: Secondary | ICD-10-CM | POA: Diagnosis not present

## 2016-03-09 DIAGNOSIS — S63619A Unspecified sprain of unspecified finger, initial encounter: Secondary | ICD-10-CM

## 2016-03-09 MED ORDER — NAPROXEN 500 MG PO TABS: 500.0000 mg | ORAL_TABLET | Freq: Two times a day (BID) | ORAL | Status: DC

## 2016-03-09 MED ORDER — TRAMADOL HCL 50 MG PO TABS: 50.0000 mg | ORAL_TABLET | Freq: Four times a day (QID) | ORAL | Status: DC | PRN

## 2016-03-09 NOTE — ED Provider Notes (Signed)
[facility] Emergency Department Provider Note   ____________________________________________  Time seen: Approximately 3:36 PM  I have reviewed the triage vital signs and the nursing notes.   HISTORY  Chief Complaint Finger Injury    HPI Haley Mclaughlin is a 50 y.o. female patient complaining of pain and edema to the left third finger secondary to hyperextension incident. Patient states she was lifting a dresser and is sleeping causing a hyperextension middle left finger. Patient stated this been continuing swollen and tender since the incident. Incident occurred approximately 45 minutes prior to arrival. No palliative measures taken for this complaint. Patient rates the pain as a 5/10. Patient described the pain as "achy".   Past Medical History  Diagnosis Date  . Hyperlipidemia   . Gestational diabetes     diet controlled with #3  . Preterm labor     with last 4 preg, del at term  . Urinary tract infection   . Abnormal Pap smear   . PID (pelvic inflammatory disease)   . Hyperlipemia   . Sarcoidosis [facility](HCC)     Patient Active Problem List   Diagnosis Date Noted  . Abdominal pain 07/03/2015  . Abnormal Pap smear of cervix 01/31/2015  . Hyperglycemia 06/05/2013  . 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 HYPERCHOLESTEROLEMIA 10/28/2006  . PANIC ATTACKS 10/28/2006  . PALPITATIONS 10/28/2006    Past Surgical History  Procedure Laterality Date  . Appendectomy    . Induced abortion      Current Outpatient Rx  Name  Route  Sig  Dispense  Refill  . acetaminophen (TYLENOL) 500 MG tablet   Oral   Take 1,000 mg by mouth every 6 (six) hours as needed for moderate pain.         Marland Kitchen. amoxicillin (AMOXIL) 875 MG tablet   Oral   Take 1 tablet (875 mg total) by mouth 2 (two) times daily.   14 tablet   0   . aspirin 81 MG tablet   Oral   Take 81 mg by mouth daily.         Marland Kitchen. atorvastatin (LIPITOR) 80 MG tablet   Oral   Take 1 tablet (80 mg  total) by mouth daily.   90 tablet   3   . fluticasone (FLONASE) 50 MCG/ACT nasal spray   Each Nare   Place 1 spray into both nostrils daily as needed.          . hyoscyamine (LEVSIN SL) 0.125 MG SL tablet   Sublingual   Place 1 tablet (0.125 mg total) under the tongue every 6 (six) hours as needed.   30 tablet   0   . ibuprofen (ADVIL,MOTRIN) 800 MG tablet   Oral   Take 1 tablet (800 mg total) by mouth every 8 (eight) hours as needed for moderate pain.   15 tablet   0   . magic mouthwash w/lidocaine SOLN   Oral   Take 5 mLs by mouth 4 (four) times daily.   240 mL   0     Dispense in a 1/1/1/1 ratio. Use lidocaine, diphen ...   . naproxen (NAPROSYN) 500 MG tablet   Oral   Take 1 tablet (500 mg total) by mouth 2 (two) times daily with a meal.   20 tablet   2   . naproxen (NAPROSYN) 500 MG tablet   Oral   Take 1 tablet (500 mg total) by mouth 2 (two) times daily with a meal.  20 tablet   0   . omeprazole (PRILOSEC) 40 MG capsule   Oral   Take 40 mg by mouth.         . traMADol (ULTRAM) 50 MG tablet   Oral   Take 1 tablet (50 mg total) by mouth every 6 (six) hours as needed.   20 tablet   0   . traMADol (ULTRAM) 50 MG tablet   Oral   Take 1 tablet (50 mg total) by mouth every 6 (six) hours as needed for moderate pain.   12 tablet   0     Allergies Sulfamethoxazole-trimethoprim  Family History  Problem Relation Age of Onset  . Heart disease Mother   . Hypertension Mother   . Diabetes Father   . Heart disease Father   . Anesthesia problems Neg Hx     Social History Social History  Substance Use Topics  . Smoking status: Never Smoker   . Smokeless tobacco: Never Used  . Alcohol Use: No    Review of Systems Constitutional: No fever/chills Eyes: No visual changes. ENT: No sore throat. Cardiovascular: Denies chest pain. Respiratory: Denies shortness of breath. Gastrointestinal: No abdominal pain.  No nausea, no vomiting.  No diarrhea.   No constipation. Genitourinary: Negative for dysuria. Musculoskeletal: Skin: Negative for rash. Neurological: Negative for headaches, focal weakness or numbness. Endocrine:Hyperlipidemia Allergic/Immunilogical: Sulfa ___________________________________________   PHYSICAL EXAM:  VITAL SIGNS: ED Triage Vitals  Enc Vitals Group     BP 03/09/16 1525 159/100 mmHg     Pulse Rate 03/09/16 1525 86     Resp 03/09/16 1525 18     Temp 03/09/16 1525 98.2 F (36.8 C)     Temp Source 03/09/16 1525 Oral     SpO2 03/09/16 1525 95 %     Weight 03/09/16 1525 210 lb (95.255 kg)     Height 03/09/16 1525  (1.626 m)     Head Cir --      Peak Flow --      Pain Score 03/09/16 1524 5     Pain Loc --      Pain Edu? --      Excl. in GC? --     Constitutional: Alert and oriented. Well appearing and in no acute distress. Eyes: Conjunctivae are normal. PERRL. EOMI. Head: Atraumatic. Nose: No congestion/rhinnorhea. Mouth/Throat: Mucous membranes are moist.  Oropharynx non-erythematous. Neck: No stridor.  No cervical spine tenderness to palpation. Hematological/Lymphatic/Immunilogical: No cervical lymphadenopathy. Cardiovascular: Normal rate, regular rhythm. Grossly normal heart sounds.  Good peripheral circulation. Respiratory: Normal respiratory effort.  No retractions. Lungs CTAB. Gastrointestinal: Soft and nontender. No distention. No abdominal bruits. No CVA tenderness. Musculoskeletal: No lower extremity tenderness nor edema.  No joint effusions. Neurologic:  Normal speech and language. No gross focal neurologic deficits are appreciated. No gait instability. Skin:  Skin is warm, dry and intact. No rash noted. Psychiatric: Mood and affect are normal. Speech and behavior are normal.  ____________________________________________   LABS (all labs ordered are listed, but only abnormal results are displayed)  Labs Reviewed - No data to  display ____________________________________________  EKG   ____________________________________________  RADIOLOGY  No acute findings x-ray of the third digit left hand. I, Joni Reining, personally viewed and evaluated these images (plain radiographs) as part of my medical decision making, as well as reviewing the written report by the radiologist.  ____________________________________________   PROCEDURES  Procedure(s) performed: None  Procedures  Critical Care performed: No  ____________________________________________  INITIAL IMPRESSION / ASSESSMENT AND PLAN / ED COURSE  Pertinent labs & imaging results that were available during my care of the patient were reviewed by me and considered in my medical decision making (see chart for details). Finger sprain third digit left hand. Discussed x-ray finding with patient. Patient placed in a finger splint. Patient given discharge care instructions. Patient given a prescription for naproxen and tramadol. Patient advised follow-up family doctor if condition persists. ____________________________________________   FINAL CLINICAL IMPRESSION(S) / ED DIAGNOSES  Final diagnoses:  Sprain, finger, initial encounter      NEW MEDICATIONS STARTED DURING THIS VISIT:  New Prescriptions   NAPROXEN (NAPROSYN) 500 MG TABLET    Take 1 tablet (500 mg total) by mouth 2 (two) times daily with a meal.   TRAMADOL (ULTRAM) 50 MG TABLET    Take 1 tablet (50 mg total) by mouth every 6 (six) hours as needed for moderate pain.     Note:  This document was prepared using Dragon voice recognition software and may include unintentional dictation errors.    Joni Reining, PA-C 03/09/16 1617  Rockne Menghini, MD 03/09/16 1949

## 2016-03-09 NOTE — Discharge Instructions (Signed)
Wrist splint for 3-5 days as needed. Finger Sprain A finger sprain happens when the bands of tissue that hold the finger bones together (ligaments) stretch too much and tear. HOME CARE  Keep your injured finger raised (elevated) when possible.  Put ice on the injured area, twice a day, for 2 to 3 days.  Put ice in a plastic bag.  Place a towel between your skin and the bag.  Leave the ice on for 15 minutes.  Only take medicine as told by your doctor.  Do not wear rings on the injured finger.  Protect your finger until pain and stiffness go away (usually 3 to 4 weeks).  Do not get your cast or splint to get wet. Cover your cast or splint with a plastic bag when you shower or bathe. Do not swim.  Your doctor may suggest special exercises for you to do. These exercises will help keep or stop stiffness from happening. GET HELP RIGHT AWAY IF:  Your cast or splint gets damaged.  Your pain gets worse, not better. MAKE SURE YOU:  Understand these instructions.  Will watch your condition.  Will get help right away if you are not doing well or get worse.   This information is not intended to replace advice given to you by your health care provider. Make sure you discuss any questions you have with your health care provider.   Document Released: 09/19/2010 Document Revised: 11/09/2011 Document Reviewed: 04/20/2011 Elsevier Interactive Patient Education [employer]2016 Elsevier Inc.

## 2016-03-09 NOTE — ED Notes (Signed)
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

## 2016-03-09 NOTE — ED Notes (Signed)
Bent middle L finger back about 30 minutes ago, swollen and painful.

## 2016-03-10 ENCOUNTER — Encounter: Payer: Self-pay | Admitting: Emergency Medicine

## 2016-03-10 ENCOUNTER — Emergency Department
Admission: EM | Admit: 2016-03-10 | Discharge: 2016-03-10 | Disposition: A | Discharge status: Home / Self Care | Payer: No Typology Code available for payment source | Attending: Emergency Medicine | Admitting: Emergency Medicine

## 2016-03-10 DIAGNOSIS — Y92410 Unspecified street and highway as the place of occurrence of the external cause: Secondary | ICD-10-CM | POA: Diagnosis not present

## 2016-03-10 DIAGNOSIS — Z7982 Long term (current) use of aspirin: Secondary | ICD-10-CM | POA: Diagnosis not present

## 2016-03-10 DIAGNOSIS — Y9389 Activity, other specified: Secondary | ICD-10-CM | POA: Diagnosis not present

## 2016-03-10 DIAGNOSIS — E785 Hyperlipidemia, unspecified: Secondary | ICD-10-CM | POA: Insufficient documentation

## 2016-03-10 DIAGNOSIS — Z7951 Long term (current) use of inhaled steroids: Secondary | ICD-10-CM | POA: Insufficient documentation

## 2016-03-10 DIAGNOSIS — Y999 Unspecified external cause status: Secondary | ICD-10-CM | POA: Insufficient documentation

## 2016-03-10 DIAGNOSIS — S39012A Strain of muscle, fascia and tendon of lower back, initial encounter: Secondary | ICD-10-CM | POA: Diagnosis not present

## 2016-03-10 DIAGNOSIS — M545 Low back pain: Secondary | ICD-10-CM | POA: Diagnosis present

## 2016-03-10 DIAGNOSIS — S161XXA Strain of muscle, fascia and tendon at neck level, initial encounter: Secondary | ICD-10-CM | POA: Diagnosis not present

## 2016-03-10 MED ORDER — NABUMETONE 750 MG PO TABS
750.0000 mg | ORAL_TABLET | Freq: Two times a day (BID) | ORAL | Status: DC
Start: 2016-03-10 — End: 2016-11-18

## 2016-03-10 MED ORDER — CYCLOBENZAPRINE HCL 5 MG PO TABS: 5.0000 mg | ORAL_TABLET | Freq: Three times a day (TID) | ORAL | Status: DC | PRN

## 2016-03-10 NOTE — ED Notes (Signed)
  Reviewed d/c instructions, follow-up care, and prescriptions with pt. Pt verbalized understanding 
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
## 2016-06-18 ENCOUNTER — Other Ambulatory Visit: Payer: Self-pay | Admitting: Family Medicine

## 2016-06-18 DIAGNOSIS — Z1231 Encounter for screening mammogram for malignant neoplasm of breast: Secondary | ICD-10-CM

## 2016-06-30 ENCOUNTER — Ambulatory Visit
Admission: RE | Admit: 2016-06-30 | Discharge: 2016-06-30 | Disposition: A | Discharge status: Home / Self Care | Payer: Medicaid Other | Source: Ambulatory Visit | Attending: Family Medicine | Admitting: Family Medicine

## 2016-06-30 DIAGNOSIS — Z1231 Encounter for screening mammogram for malignant neoplasm of breast: Secondary | ICD-10-CM

## 2016-07-31 ENCOUNTER — Other Ambulatory Visit: Payer: Self-pay | Admitting: Family Medicine

## 2016-07-31 MED ORDER — PERMETHRIN 5 % EX CREA: 1.0000 "application " | TOPICAL_CREAM | Freq: Once | CUTANEOUS | 0 refills | Status: AC

## 2016-07-31 NOTE — Progress Notes (Signed)
Patient had a household member diagnosed with scabies. Will treat entire family with a course of permethrin. Including this patient.  Haley Mclaughlin [name], MD [facility] Resident PGY-3 07/31/2016 12:01 PM

## 2016-08-21 ENCOUNTER — Encounter: Payer: Self-pay | Admitting: Obstetrics and Gynecology

## 2016-08-21 ENCOUNTER — Ambulatory Visit (INDEPENDENT_AMBULATORY_CARE_PROVIDER_SITE_OTHER): Payer: Self-pay | Admitting: Obstetrics and Gynecology

## 2016-08-21 VITALS — BP 140/90 | HR 83 | Temp 98.3°F | Ht 64.0 in | Wt 213.0 lb

## 2016-08-21 DIAGNOSIS — Z114 Encounter for screening for human immunodeficiency virus [HIV]: Secondary | ICD-10-CM

## 2016-08-21 DIAGNOSIS — R739 Hyperglycemia, unspecified: Secondary | ICD-10-CM

## 2016-08-21 DIAGNOSIS — R030 Elevated blood-pressure reading, without diagnosis of hypertension: Secondary | ICD-10-CM | POA: Insufficient documentation

## 2016-08-21 LAB — POCT GLYCOSYLATED HEMOGLOBIN (HGB A1C): Hemoglobin A1C: 6.2

## 2016-08-21 LAB — TSH: TSH: 2.04 m[IU]/L

## 2016-08-21 MED ORDER — METFORMIN HCL 500 MG PO TABS: 500.0000 mg | ORAL_TABLET | Freq: Two times a day (BID) | ORAL | 3 refills | Status: DC

## 2016-08-21 NOTE — Patient Instructions (Addendum)
Metformin started  Will call you about lab results Come back in 2-3 weeks to meet with PCP to discuss blood pressure and sugars  Prediabetes Prediabetes is the condition of having a blood sugar (blood glucose) level that is higher than it should be, but not high enough for you to be diagnosed with type 2 diabetes. Having prediabetes puts you at risk for developing type 2 diabetes (type 2 diabetes mellitus). Prediabetes may be called impaired glucose tolerance or impaired fasting glucose. Prediabetes usually does not cause symptoms. Your health care provider can diagnose this condition with blood tests. You may be tested for prediabetes if you are overweight and if you have at least one other risk factor for prediabetes. Risk factors for prediabetes include:  Having a family member with type 2 diabetes.  Being overweight or obese.  Being older than age 49.  Being of American-Indian, African-American, Hispanic/Latino, or Asian/Pacific Islander descent.  Having an inactive (sedentary) lifestyle.  Having a history of gestational diabetes or polycystic ovarian syndrome (PCOS).  Having low levels of good cholesterol (HDL-C) or high levels of blood fats (triglycerides).  Having high blood pressure. What is blood glucose and how is blood glucose measured?   Blood glucose refers to the amount of glucose in your bloodstream. Glucose comes from eating foods that contain sugars and starches (carbohydrates) that the body breaks down into glucose. Your blood glucose level may be measured in mg/dL (milligrams per deciliter) or mmol/L (millimoles per liter).Your blood glucose may be checked with one or more of the following blood tests:  A fasting blood glucose (FBG) test. You will not be allowed to eat (you will fast) for at least 8 hours before a blood sample is taken.  A normal range for FBG is 70-100 mg/dl (0.9-8.13.9-5.6 mmol/L).  An A1c (hemoglobin A1c) blood test. This test provides information  about blood glucose control over the previous 2?3months.  An oral glucose tolerance test (OGTT). This test measures your blood glucose twice:  After fasting. This is your baseline level.  Two hours after you drink a beverage that contains glucose. You may be diagnosed with prediabetes:  If your FBG is 100?125 mg/dL (1.9-1.45.6-6.9 mmol/L).  If your A1c level is 5.7?6.4%.  If your OGGT result is 140?199 mg/dL (7.8-297.8-11 mmol/L). These blood tests may be repeated to confirm your diagnosis. What happens if blood glucose is too high? The pancreas produces a hormone (insulin) that helps move glucose from the bloodstream into cells. When cells in the body do not respond properly to insulin that the body makes (insulin resistance), excess glucose builds up in the blood instead of going into cells. As a result, high blood glucose (hyperglycemia) can develop, which can cause many complications. This is a symptom of prediabetes. What can happen if blood glucose stays higher than normal for a long time? Having high blood glucose for a long time is dangerous. Too much glucose in your blood can damage your nerves and blood vessels. Long-term damage can lead to complications from diabetes, which may include:  Heart disease.  Stroke.  Blindness.  Kidney disease.  Depression.  Poor circulation in the feet and legs, which could lead to surgical removal (amputation) in severe cases. How can prediabetes be prevented from turning into type 2 diabetes?   To help prevent type 2 diabetes, take the following actions:  Be physically active.  Do moderate-intensity physical activity for at least 30 minutes on at least 5 days of the week, or as much  as told by your health care provider. This could be brisk walking, biking, or water aerobics.  Ask your health care provider what activities are safe for you. A mix of physical activities may be best, such as walking, swimming, cycling, and strength training.  Lose  weight as told by your health care provider.  Losing 5-7% of your body weight can reverse insulin resistance.  Your health care provider can determine how much weight loss is best for you and can help you lose weight safely.  Follow a healthy meal plan. This includes eating lean proteins, complex carbohydrates, fresh fruits and vegetables, low-fat dairy products, and healthy fats.  Follow instructions from your health care provider about eating or drinking restrictions.  Make an appointment to see a diet and nutrition specialist (registered dietitian) to help you create a healthy eating plan that is right for you.  Do not smoke or use any tobacco products, such as cigarettes, chewing tobacco, and e-cigarettes. If you need help quitting, ask your health care provider.  Take over-the-counter and prescription medicines as told by your health care provider. You may be prescribed medicines that help lower the risk of type 2 diabetes. This information is not intended to replace advice given to you by your health care provider. Make sure you discuss any questions you have with your health care provider. Document Released: 12/09/2015 Document Revised: 01/23/2016 Document Reviewed: 10/08/2015 Elsevier Interactive Patient Education  2017 [employer]

## 2016-08-21 NOTE — Progress Notes (Signed)
Subjective:   Patient ID: Haley Mclaughlin, female    DOB: [birth year], 50 y.o.   MRN: [id number]  Patient presents for Same Day Appointment  Chief Complaint  Patient presents with  . Hyperglycemia    HPI: # Hyperglycemia Patient states that about a week ago she was cooking dinner she became shaky, had blurred vision, and palpitations. She thought her blood sugar was low so checked her sugars. Blood sugar was elevated at 253. Ever since then she has been checking her blood sugar every morning. Fasting blood sugars have ranged from 115 to 160. Patient has been watching what she eats. Feels like symptoms worsen after she eats a lot of carbs or sugar snacks.  FH - Dad and grandma with diabetes Significant PMH - Had gestional diabetes, no thyroid dysfunction   Past Medical History:  Diagnosis Date  . Abnormal Pap smear   . Gestational diabetes    diet controlled with #3  . Hyperlipemia   . Hyperlipidemia   . PID (pelvic inflammatory disease)   . Preterm labor    with last 4 preg, del at term  . Sarcoidosis (HCC)   . Urinary tract infection     Review of Systems   See HPI for ROS.   History  Smoking Status  . Never Smoker  Smokeless Tobacco  . Never Used    Past medical history, surgical, family, and social history reviewed and updated in the EMR as appropriate.  Objective:  BP 140/90 (BP Location: Right Arm, Patient Position: Sitting, Cuff Size: Large)   Pulse 83   Temp 98.3 F (36.8 C) (Oral)   Ht 5\' 4"  (1.626 m)   Wt 213 lb (96.6 kg)   LMP 07/15/2016 (Exact Date) Comment: irregular cycles   SpO2 98%   BMI 36.56 kg/m  Vitals and nursing note reviewed  Physical Exam  Constitutional: She is oriented to person, place, and time and well-developed, well-nourished, and in no distress.  Neck: Normal range of motion. Neck supple. No thyromegaly present.  Cardiovascular: Normal rate, regular rhythm and normal heart sounds.   Pulmonary/Chest: Effort normal and breath  sounds normal.  Neurological: She is alert and oriented to person, place, and time. She has normal motor skills, normal sensation, normal strength and normal reflexes. No cranial nerve deficit.   A1c- 6.2  Assessment & Plan:  1. Hyperglycemia H/o gestational diabetes. Strong FH of diabetes. Patient with elevated blood sugar readings on prior lab work. A1c collected today 6.2 placing patient in prediabetes range. Discussed implication of this with patient. Will start her on metformin 500mg  BID. PCP to titrate up as appropriate. Encouraged patient to do lifestyle modifications to include exercise and healthy eating. Will also collect TSH due to some of her reported symptoms. Handout given with further patient information. Follow-up with PCP in 2 weeks. - HgB A1c - TSH  2. Screening for HIV (human immunodeficiency virus) - HIV antibody collected with other blood work  3. Elevated blood pressure reading Elevated blood pressure reading noted at today's visit. Also noted on prior visits. Patient not on medication. To follow-up with PCP for management.    Meds ordered this encounter  Medications  . metFORMIN (GLUCOPHAGE) 500 MG tablet    Sig: Take 1 tablet (500 mg total) by mouth 2 (two) times daily with a meal.    Dispense:  180 tablet    Refill:  3    Caryl AdaJazma Phelps, DO 08/21/2016, 10:11 AM PGY-3, [facility]

## 2016-08-22 LAB — HIV ANTIBODY (ROUTINE TESTING W REFLEX): HIV: NONREACTIVE

## 2016-10-16 ENCOUNTER — Telehealth: Payer: Self-pay | Admitting: Family Medicine

## 2016-10-16 NOTE — Telephone Encounter (Signed)
Scheduled 30 min follow-up and pap smear appt 3/21 @10am 

## 2016-11-18 ENCOUNTER — Encounter: Payer: Self-pay | Admitting: Family Medicine

## 2016-11-18 ENCOUNTER — Ambulatory Visit (INDEPENDENT_AMBULATORY_CARE_PROVIDER_SITE_OTHER): Payer: Medicaid Other | Admitting: Family Medicine

## 2016-11-18 ENCOUNTER — Other Ambulatory Visit (HOSPITAL_COMMUNITY)
Admission: RE | Admit: 2016-11-18 | Discharge: 2016-11-18 | Disposition: A | Discharge status: Home / Self Care | Payer: Medicaid Other | Source: Ambulatory Visit | Attending: Family Medicine | Admitting: Family Medicine

## 2016-11-18 VITALS — BP 150/98 | HR 78 | Temp 98.6°F | Ht 64.0 in | Wt 204.0 lb

## 2016-11-18 DIAGNOSIS — Z124 Encounter for screening for malignant neoplasm of cervix: Secondary | ICD-10-CM | POA: Diagnosis present

## 2016-11-18 DIAGNOSIS — R030 Elevated blood-pressure reading, without diagnosis of hypertension: Secondary | ICD-10-CM | POA: Diagnosis not present

## 2016-11-18 DIAGNOSIS — E7800 Pure hypercholesterolemia, unspecified: Secondary | ICD-10-CM | POA: Diagnosis present

## 2016-11-18 DIAGNOSIS — R739 Hyperglycemia, unspecified: Secondary | ICD-10-CM | POA: Diagnosis present

## 2016-11-18 DIAGNOSIS — R87619 Unspecified abnormal cytological findings in specimens from cervix uteri: Secondary | ICD-10-CM

## 2016-11-18 LAB — POCT GLYCOSYLATED HEMOGLOBIN (HGB A1C): Hemoglobin A1C: 5.8

## 2016-11-18 NOTE — Patient Instructions (Addendum)
Good to see you today!  Thanks for coming in.  I will be in touch about your pap and blood tests  Check your blood pressure it should be usually , 140/90.  If it is not then come back and bring your readings

## 2016-11-19 LAB — BASIC METABOLIC PANEL
BUN / CREAT RATIO: 15 (ref 9–23)
BUN: 11 mg/dL (ref 6–24)
CO2: 27 mmol/L (ref 18–29)
Calcium: 9.3 mg/dL (ref 8.7–10.2)
Chloride: 100 mmol/L (ref 96–106)
Creatinine, Ser: 0.71 mg/dL (ref 0.57–1.00)
GFR calc Af Amer: 116 mL/min/{1.73_m2} (ref >59)
GFR calc non Af Amer: 100 mL/min/{1.73_m2} (ref >59)
Glucose: 113 mg/dL — ABNORMAL HIGH (ref 65–99)
POTASSIUM: 4.6 mmol/L (ref 3.5–5.2)
SODIUM: 139 mmol/L (ref 134–144)

## 2016-11-19 LAB — LIPID PANEL
CHOL/HDL RATIO: 9.2 ratio — AB (ref 0.0–4.4)
Cholesterol, Total: 341 mg/dL — ABNORMAL HIGH (ref 100–199)
HDL: 37 mg/dL — AB (ref >39)
LDL Calculated: 281 mg/dL — ABNORMAL HIGH (ref 0–99)
Triglycerides: 116 mg/dL (ref 0–149)
VLDL Cholesterol Cal: 23 mg/dL (ref 5–40)

## 2016-11-19 NOTE — Progress Notes (Signed)
Subjective  Patient is presenting with the following illnesses  Cholesterol - taking lipitor regularly  Sarcoid - seems in remission is [facility]    Chief Complaint noted Review of Symptoms - see HPI PMH - Smoking status noted.     Objective Vital Signs reviewed Heart - Regular rate and rhythm.  No murmurs, gallops or rubs.    Lungs:  Normal respiratory effort, chest expands symmetrically. Lungs are clear to auscultation, no crackles or wheezes. Genitalia:  Normal introitus for age, no external lesions, no vaginal discharge, mucosa pink and moist, no vaginal or cervical lesions, no vaginal atrophy, no friaility or hemorrhage, normal uterus size and position, no adnexal masses or tenderness     Assessments/Plans  No problem-specific Assessment & Plan notes found for this encounter.   See Encounter view if individual problem A/Ps not visible See after visit summary for details of patient instuctions

## 2016-11-20 NOTE — Assessment & Plan Note (Signed)
Check labs 

## 2016-11-20 NOTE — Assessment & Plan Note (Signed)
BP Readings from Last 3 Encounters:  11/18/16 (!) 150/98  08/21/16 140/90  03/10/16 (!) 148/99   Will monitor Likely white coat hypertension

## 2016-11-20 NOTE — Assessment & Plan Note (Signed)
Good A1c

## 2016-11-20 NOTE — Assessment & Plan Note (Signed)
Repeat Pap today

## 2016-11-23 LAB — CYTOLOGY - PAP
Diagnosis: NEGATIVE
HPV (WINDOPATH): NOT DETECTED

## 2016-12-01 ENCOUNTER — Ambulatory Visit (INDEPENDENT_AMBULATORY_CARE_PROVIDER_SITE_OTHER): Payer: Medicaid Other | Admitting: Student

## 2016-12-01 ENCOUNTER — Encounter: Payer: Self-pay | Admitting: Student

## 2016-12-01 VITALS — BP 118/80 | HR 73 | Temp 98.2°F | Wt 202.0 lb

## 2016-12-01 DIAGNOSIS — R300 Dysuria: Secondary | ICD-10-CM | POA: Diagnosis present

## 2016-12-01 LAB — POCT URINALYSIS DIP (MANUAL ENTRY)
BILIRUBIN UA: NEGATIVE
GLUCOSE UA: NEGATIVE
Ketones, POC UA: NEGATIVE
NITRITE UA: NEGATIVE
Protein Ur, POC: NEGATIVE
SPEC GRAV UA: 1.02 (ref 1.030–1.035)
Urobilinogen, UA: 1 (ref ?–2.0)
pH, UA: 7.5 (ref 5.0–8.0)

## 2016-12-01 LAB — POCT UA - MICROSCOPIC ONLY

## 2016-12-01 MED ORDER — CEPHALEXIN 500 MG PO CAPS: 500.0000 mg | ORAL_CAPSULE | Freq: Two times a day (BID) | ORAL | 0 refills | Status: DC

## 2016-12-01 NOTE — Patient Instructions (Signed)
Take antibiotics as prescribed for UTI Follow up if you have no improvement or as needed Call the office at [phone] with questions or concerns

## 2016-12-01 NOTE — Progress Notes (Signed)
   Subjective:    Patient ID: Haley Mclaughlin, female    DOB: 01-28-1966, 51 y.o.   MRN: [id number]   CC: dysuria  HPI: 51 y/o present for dysuria  Dysuria - started 5 days ago - no abdominal pain, vaginal irritation or pain, no back pain, no fevers - feels similar to UTIs she has had in past  Smoking status reviewed  Review of Systems  Per HPI, else denies  chest pain, shortness of breath,     Objective:  BP 118/80   Pulse 73   Temp 98.2 F (36.8 C) (Oral)   Wt 202 lb (91.6 kg)   LMP 11/26/2016   SpO2 98%   BMI 34.67 kg/m  Vitals and nursing note reviewed  General: NAD Cardiac: RRR,  Respiratory: CTAB, normal effort Abdomen: obese, soft, nontender, nondistended,  Bowel sounds present MSK: No CVA tenderness bilaterally Neuro: alert and oriented, no focal deficits   Assessment & Plan:    Dysuria Dysuria without abdominal pain, back pain or fevers consistent with simple cystitis - UA and UCx - will treat with keflex and follow sensitvities    [name] A. Kennon Rounds MD, MS Family Medicine Resident PGY-3 Pager [phone]

## 2016-12-01 NOTE — Assessment & Plan Note (Signed)
Dysuria without abdominal pain, back pain or fevers consistent with simple cystitis - UA and UCx - will treat with keflex and follow sensitvities

## 2016-12-03 ENCOUNTER — Telehealth: Payer: Self-pay | Admitting: Family Medicine

## 2016-12-03 ENCOUNTER — Encounter: Payer: Self-pay | Admitting: Family Medicine

## 2016-12-03 NOTE — Telephone Encounter (Signed)
Spoke with her about her cholesterol.  She has been taking lipitor 80 mg for months a misses perhaps one day per week.  Will investigate adding additional medication but for now keep taking as she is

## 2017-01-13 ENCOUNTER — Telehealth: Payer: Self-pay | Admitting: Family Medicine

## 2017-01-13 NOTE — Telephone Encounter (Signed)
Pt has not had her colonoscopy and wasn't sure if she should receive one. I let her know that it screens for colon CA and it's importance.  Pt has been compliant with her medication. BP readings have been around 140/90. Let her know if it's consistently over that number then come in.  Pt has been walking more and watching what she eats.  - Haley Mclaughlin

## 2017-01-21 ENCOUNTER — Ambulatory Visit (INDEPENDENT_AMBULATORY_CARE_PROVIDER_SITE_OTHER): Payer: Medicaid Other | Admitting: Family Medicine

## 2017-01-21 ENCOUNTER — Encounter: Payer: Self-pay | Admitting: Family Medicine

## 2017-01-21 DIAGNOSIS — R519 Headache, unspecified: Secondary | ICD-10-CM | POA: Insufficient documentation

## 2017-01-21 DIAGNOSIS — R51 Headache: Secondary | ICD-10-CM | POA: Diagnosis present

## 2017-01-21 MED ORDER — METOPROLOL TARTRATE 25 MG PO TABS: 12.5000 mg | ORAL_TABLET | Freq: Two times a day (BID) | ORAL | 1 refills | Status: DC

## 2017-01-21 NOTE — Progress Notes (Signed)
   HPI  CC: HEADACHE  Started about 1 week ago. Typically located globally. Has experienced occasional episodes of severe right parietal pain (4x). During these episodes she has had left arm numbness. Episodes last a few seconds. The HAs themselves have lasted a few hours. Takes tylenol >> some improvement. Typically first thing in the morning.  Onset: 1 week Location: global and right parietal Quality: squeezing Frequency: daily x 1 week >> use to be 2-3x a week previously Precipitating factors: no Prior treatment: tylenol  Associated Symptoms Nausea/vomiting: yes, nausea (sometimes)  Photophobia/phonophobia: yes, light (sometimes)  Tearing of eyes: no  Sinus pain/pressure: no  Family hx migraine: no  Personal stressors: no  Relation to menstrual cycle: no   Red Flags Fever: no  Neck pain/stiffness: yes; more stiffness Vision/speech/swallow/hearing difficulty: yes, some vision blurriness.   Focal weakness/numbness: yes, episodic left arm numbness/heaviness  Altered mental status: no  Trauma: no  New type of headache: yes  Anticoagulant use: no  H/o cancer/HIV/Pregnancy: no    Review of Systems See HPI for ROS.   CC, SH/smoking status, and VS noted  Objective: BP (!) 158/100   Pulse 85   Temp 98.2 F (36.8 C) (Oral)   Wt 207 lb (93.9 kg)   LMP 01/18/2017   BMI 35.53 kg/m  Gen: NAD, alert, cooperative, and pleasant. HEENT: NCAT, EOMI, PERRL, MMM, visual fields intact, OP clear, TMs clear, no LAD. CV: RRR, no murmur Resp: CTAB, no wheezes, non-labored Ext: No edema, warm Neuro: Alert and oriented, Speech clear, CN II through XII intact, finger to nose unremarkable, rapid alternating movements unremarkable. Negative Romberg.   Assessment and plan:  New onset of headaches Patient is here with new/worsening headaches. Most consistent with stress/tension headache versus migraine versus OSA-related headache versus hypertensive headache. Some of patient's symptoms  seem to be consistent with a migraine however she has no family history or prior history of this condition. Patient is hypertensive today and has symptoms consistent with a hypertensive headache including blurred vision. However no formal diagnosis of hypertension has been made as her blood pressures seem to be consistently inconsistent. Patient has a prior diagnosis of [facility]. Patient does not use her CPAP machine due to issues with anxiety. Stress/tension headaches are always a possibility but less consistent with these symptoms as she does not relate any new stressors at this time.   Patient's episodes of left arm weakness/numbness are vexing. It is unlikely to have significant pain if experiencing a stroke. Patient has had a previous MRI and no mention of aneurysm (even if an aneurysm was present the pain would unlikely be so episodic the way that it has). - I strongly encouraged patient to follow-up with her pulmonologist and to restart the use of her CPAP. I informed patient that if her CPap machine is unbearable and there are possible mouthguard-like options her pulmonologist may be able to set her up with. - Initiating metoprolol to help with any possibility of migraine as well as issues with her blood pressure. - Encouraged over-the-counter Aleve and Tylenol. - Follow-up with PCP   Meds ordered this encounter  Medications  . metoprolol tartrate (LOPRESSOR) 25 MG tablet    Sig: Take 0.5 tablets (12.5 mg total) by mouth 2 (two) times daily.    Dispense:  45 tablet    Refill:  1     Kathee DeltonIan D McKeag, MD,MS,  PGY3 01/21/2017 6:02 PM

## 2017-01-21 NOTE — Assessment & Plan Note (Addendum)
Patient is here with new/worsening headaches. Most consistent with stress/tension headache versus migraine versus OSA-related headache versus hypertensive headache. Some of patient's symptoms seem to be consistent with a migraine however she has no family history or prior history of this condition. Patient is hypertensive today and has symptoms consistent with a hypertensive headache including blurred vision. However no formal diagnosis of hypertension has been made as her blood pressures seem to be consistently inconsistent. Patient has a prior diagnosis of [facility]. Patient does not use her CPAP machine due to issues with anxiety. Stress/tension headaches are always a possibility but less consistent with these symptoms as she does not relate any new stressors at this time.   Patient's episodes of left arm weakness/numbness are vexing. It is unlikely to have significant pain if experiencing a stroke. Patient has had a previous MRI and no mention of aneurysm (even if an aneurysm was present the pain would unlikely be so episodic the way that it has). - I strongly encouraged patient to follow-up with her pulmonologist and to restart the use of her CPAP. I informed patient that if her CPap machine is unbearable and there are possible mouthguard-like options her pulmonologist may be able to set her up with. - Initiating metoprolol to help with any possibility of migraine as well as issues with her blood pressure. - Encouraged over-the-counter Aleve and Tylenol. - Follow-up with PCP

## 2017-01-21 NOTE — Patient Instructions (Addendum)
It was a pleasure seeing you today in our clinic. Today we discussed your headache. Here is the treatment plan we have discussed and agreed upon together:   - I would encourage you to restart using your CPAP machine. If you are still unable to tolerate this machine then please contact your pulmonologist (lung specialist) as they may be able to set you up with a specific mouth piece which could help instead of the CPAP. - I have started you on a new medication called metoprolol. Take 1/2 tablet twice a day. This should help prevent some of these headaches in the future. - Continue to take Tylenol as needed for your headache. Over-the-counter Aleve (twice a day as needed) may also help -- do not take Aleve with ibuprofen/Motrin/Advil. - Follow-up with your PCP in one month. - If you experience any persistent numbness/weakness in any of your extremities, confusion, difficulty speaking, vision changes, or worsening symptoms do not hesitate to seek medical attention at the nearest emergency department.

## 2017-01-22 ENCOUNTER — Telehealth: Payer: Self-pay | Admitting: Family Medicine

## 2017-01-22 NOTE — Telephone Encounter (Signed)
UTI is back. She would like to have refill on celfex.  [facility] on [address] in [place]

## 2017-01-27 ENCOUNTER — Ambulatory Visit (INDEPENDENT_AMBULATORY_CARE_PROVIDER_SITE_OTHER): Payer: Medicaid Other | Admitting: Family Medicine

## 2017-01-27 DIAGNOSIS — R030 Elevated blood-pressure reading, without diagnosis of hypertension: Secondary | ICD-10-CM | POA: Diagnosis not present

## 2017-01-27 DIAGNOSIS — N390 Urinary tract infection, site not specified: Secondary | ICD-10-CM

## 2017-01-27 DIAGNOSIS — R300 Dysuria: Secondary | ICD-10-CM | POA: Diagnosis present

## 2017-01-27 DIAGNOSIS — M545 Low back pain, unspecified: Secondary | ICD-10-CM

## 2017-01-27 DIAGNOSIS — E7800 Pure hypercholesterolemia, unspecified: Secondary | ICD-10-CM

## 2017-01-27 DIAGNOSIS — R519 Headache, unspecified: Secondary | ICD-10-CM

## 2017-01-27 DIAGNOSIS — R51 Headache: Secondary | ICD-10-CM | POA: Diagnosis not present

## 2017-01-27 LAB — POCT URINALYSIS DIP (MANUAL ENTRY)
Bilirubin, UA: NEGATIVE
Glucose, UA: NEGATIVE mg/dL
Ketones, POC UA: NEGATIVE mg/dL
Nitrite, UA: NEGATIVE
Spec Grav, UA: >=1.03 — AB (ref 1.010–1.025)
UROBILINOGEN UA: 0.2 U/dL
pH, UA: 6 (ref 5.0–8.0)

## 2017-01-27 LAB — POCT UA - MICROSCOPIC ONLY

## 2017-01-27 MED ORDER — CEPHALEXIN 500 MG PO CAPS: 500.0000 mg | ORAL_CAPSULE | Freq: Two times a day (BID) | ORAL | 0 refills | Status: DC

## 2017-01-27 MED ORDER — HYDROCORTISONE 2.5 % EX OINT: TOPICAL_OINTMENT | Freq: Two times a day (BID) | CUTANEOUS | 2 refills | Status: DC

## 2017-01-27 MED ORDER — ROSUVASTATIN CALCIUM 40 MG PO TABS: 40.0000 mg | ORAL_TABLET | Freq: Every day | ORAL | 1 refills | Status: DC

## 2017-01-27 NOTE — Progress Notes (Signed)
Subjective  Patient is presenting with the following illnesses  Dysuria Has recurred after completely resolving last month.  No fever or nausea and vomiting but does have low backpain.  Has nausea with cipro in past  Headaches See last visit.  Feels some better.  Has some tingling over the left face area that seems worse when her mid back hurts.  No syncope or speech problems or weakness  Back Pain Central lower back started a week ago.  No known trauma or over use.  No lower extremity weakness or urinary incontinence.  One episode of bowel incontinence a week ago none since.  No fevers    Chief Complaint noted Review of Symptoms - see HPI PMH - Smoking status noted.     Objective Vital Signs reviewed Alert oriented NAD No CVAT  Tender over mid lower back from bra line to waist.  No specific vertebra pain.  No skin changes Able to bend and arch with mild change in pain Normal gait and upper lower extrem strength BP 154/98 in left arm    Assessments/Plans  No problem-specific Assessment & Plan notes found for this encounter.   See Encounter view if individual problem A/Ps not visible See after visit summary for details of patient instuctions

## 2017-01-27 NOTE — Assessment & Plan Note (Signed)
New onset.  Focal mid back without consistent neurological signs.  Most consistent with a musculoskeletal strain.  Will monitor.  See after visit summary

## 2017-01-27 NOTE — Assessment & Plan Note (Signed)
Continues today but is in pain and has UTI.  Will montior

## 2017-01-27 NOTE — Assessment & Plan Note (Signed)
Change from lipitor to crestor

## 2017-01-27 NOTE — Progress Notes (Signed)
Subjective  Patient is presenting with the following illnesses     Chief Complaint noted Review of Symptoms - see HPI PMH - Smoking status noted.     Objective Vital Signs reviewed     Assessments/Plans  No problem-specific Assessment & Plan notes found for this encounter.   See Encounter view if individual problem A/Ps not visible See after visit summary for details of patient instuctions 

## 2017-01-27 NOTE — Addendum Note (Signed)
Addended by: Jennette BillBUSICK, Guenevere Roorda L on: 01/27/2017 11:02 AM   Modules accepted: Orders

## 2017-01-27 NOTE — Patient Instructions (Addendum)
  Take the keflex for 7 days twice a day.  If any fever or vomiting or if not better in 2 days then call me  I will send in a prescription for crestor.  Stop the lipitor for 2 days then start this one a day  Take ibuprofen for the headache and back pain as needed  Bring in your blood pressure machine next visit -   Come back in 2 weeks

## 2017-01-27 NOTE — Assessment & Plan Note (Signed)
Improved by her report.  Unsure if any connection with her low back pain.  No signs of focal CNS disease.  Will follow closely

## 2017-01-27 NOTE — Assessment & Plan Note (Signed)
Recurrent likely UTI, Will treat with keflex again given allergy to bactrim and history of nausea with cipro.  Check culture

## 2017-01-29 LAB — URINE CULTURE

## 2017-02-02 ENCOUNTER — Emergency Department: Payer: Medicaid Other

## 2017-02-02 ENCOUNTER — Emergency Department
Admission: EM | Admit: 2017-02-02 | Discharge: 2017-02-02 | Disposition: A | Discharge status: Home / Self Care | Payer: Medicaid Other | Attending: Emergency Medicine | Admitting: Emergency Medicine

## 2017-02-02 ENCOUNTER — Encounter: Payer: Self-pay | Admitting: Emergency Medicine

## 2017-02-02 DIAGNOSIS — M545 Low back pain, unspecified: Secondary | ICD-10-CM

## 2017-02-02 DIAGNOSIS — Z7982 Long term (current) use of aspirin: Secondary | ICD-10-CM | POA: Insufficient documentation

## 2017-02-02 LAB — POCT PREGNANCY, URINE: Preg Test, Ur: NEGATIVE

## 2017-02-02 MED ORDER — DICLOFENAC SODIUM 75 MG PO TBEC: 75.0000 mg | DELAYED_RELEASE_TABLET | Freq: Two times a day (BID) | ORAL | 0 refills | Status: DC

## 2017-02-02 MED ORDER — CYCLOBENZAPRINE HCL 5 MG PO TABS: 5.0000 mg | ORAL_TABLET | Freq: Three times a day (TID) | ORAL | 0 refills | Status: DC | PRN

## 2017-02-02 NOTE — Discharge Instructions (Signed)
Your exam and x-ray are essentially normal today. You should follow-up with your provider if symptoms worsen or persist. Take the prescription meds as directed. Consider applying moist heat or ice to your back for comfort.

## 2017-02-02 NOTE — Progress Notes (Signed)
Patient seen by Dr. Deirdre Priesthambliss.

## 2017-02-02 NOTE — ED Triage Notes (Signed)
C/O "pain to spine" x 4 days.  Denies injury.  Has been taking ibuprofen for discomfort, with minimal effect.  MAE equally and strong.  Ambulates with easy and steady gait.  Posture upright and relaxed.

## 2017-02-02 NOTE — ED Provider Notes (Signed)
[facility] Emergency Department Provider Note ____________________________________________  Time seen: 1537  I have reviewed the triage vital signs and the nursing notes.  HISTORY  Chief Complaint  Back Pain  HPI Haley Mclaughlin is a 51 y.o. female presents to the ED for evaluation of low back pain. Patient describes "pain to the spine", for the last 4 days. She denies any recent injury, accident, trauma, or fall. She does admit, when pressed, to using a weedeater last week while doing some yard work. She would describe onset of her back pain about 2 days later. She is been taking ibuprofen with limited benefit to her symptoms. She denies any distal paresthesias, foot drop, or bladder or bowel incontinence. She reports tenderness to the midline of her spine without referred pain. She is a nonsmoker and denies any significant medical history.  Past Medical History:  Diagnosis Date  . Abnormal Pap smear   . Gestational diabetes    diet controlled with #3  . Hyperlipemia   . Hyperlipidemia   . PID (pelvic inflammatory disease)   . Preterm labor    with last 4 preg, del at term  . Sarcoidosis   . Urinary tract infection     Patient Active Problem List   Diagnosis Date Noted  . New onset of headaches 01/21/2017  . Elevated blood pressure reading 08/21/2016  . Abnormal Pap smear of cervix 01/31/2015  . Hyperglycemia 06/05/2013  . Dysuria 03/20/2013  . 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 Back pain 11/02/2012  . HYPERCHOLESTEROLEMIA 10/28/2006  . PANIC ATTACKS 10/28/2006  . PALPITATIONS 10/28/2006    Past Surgical History:  Procedure Laterality Date  . APPENDECTOMY    . INDUCED ABORTION      Prior to Admission medications   Medication Sig Start Date End Date Taking? Authorizing Provider  acetaminophen (TYLENOL) 500 MG tablet Take 1,000 mg by mouth every 6 (six) hours as needed for moderate pain.    [provider]  aspirin 81 MG tablet  Take 81 mg by mouth daily.    [provider]  cephALEXin (KEFLEX) 500 MG capsule Take 1 capsule (500 mg total) by mouth 2 (two) times daily. 01/27/17   Carney Livinghambliss, Marshall L, MD  cyclobenzaprine (FLEXERIL) 5 MG tablet Take 1 tablet (5 mg total) by mouth 3 (three) times daily as needed for muscle spasms. 02/02/17   Adaora Mchaney, Charlesetta IvoryJenise V Bacon, PA-C  diclofenac (VOLTAREN) 75 MG EC tablet Take 1 tablet (75 mg total) by mouth 2 (two) times daily. 02/02/17   Lannah Koike, Charlesetta IvoryJenise V Bacon, PA-C  fluticasone (FLONASE) 50 MCG/ACT nasal spray Place 1 spray into both nostrils daily as needed.     [provider]  hydrocortisone 2.5 % ointment Apply topically 2 (two) times daily. 01/27/17   Carney Livinghambliss, Marshall L, MD  ibuprofen (ADVIL,MOTRIN) 800 MG tablet Take 1 tablet (800 mg total) by mouth every 8 (eight) hours as needed for moderate pain. 03/09/15   Irean HongSung, Jade J, MD  metoprolol tartrate (LOPRESSOR) 25 MG tablet Take 0.5 tablets (12.5 mg total) by mouth 2 (two) times daily. 01/21/17   McKeag, Janine OresIan D, MD  omeprazole (PRILOSEC) 40 MG capsule Take 40 mg by mouth. 12/11/14   [provider]  rosuvastatin (CRESTOR) 40 MG tablet Take 1 tablet (40 mg total) by mouth daily. 01/27/17   Carney Livinghambliss, Marshall L, MD    Allergies Sulfamethoxazole-trimethoprim  Family History  Problem Relation Age of Onset  . Heart disease Mother   . Hypertension  Mother   . Diabetes Father   . Heart disease Father   . Anesthesia problems Neg Hx     Social History Social History  Substance Use Topics  . Smoking status: Never Smoker  . Smokeless tobacco: Never Used  . Alcohol use No    Review of Systems  Constitutional: Negative for fever. Cardiovascular: Negative for chest pain. Respiratory: Negative for shortness of breath. Gastrointestinal: Negative for abdominal pain, vomiting and diarrhea. Genitourinary: Negative for dysuria. Musculoskeletal: Positive for back pain. Skin: Negative for rash. Neurological:  Negative for headaches, focal weakness or numbness. ____________________________________________  PHYSICAL EXAM:  VITAL SIGNS: ED Triage Vitals  Enc Vitals Group     BP 02/02/17 1418 (!) 158/81     Pulse Rate 02/02/17 1418 91     Resp 02/02/17 1418 16     Temp 02/02/17 1418 98.2 F (36.8 C)     Temp Source 02/02/17 1418 Oral     SpO2 02/02/17 1418 98 %     Weight 02/02/17 1417 205 lb (93 kg)     Height 02/02/17 1417 5\' 4"  (1.626 m)     Head Circumference --      Peak Flow --      Pain Score --      Pain Loc --      Pain Edu? --      Excl. in GC? --     Constitutional: Alert and oriented. Well appearing and in no distress. Head: Normocephalic and atraumatic. Cardiovascular: Normal rate, regular rhythm. Normal distal pulses. Respiratory: Normal respiratory effort. No wheezes/rales/rhonchi. Gastrointestinal: Soft and nontender. No distention. Musculoskeletal: Normal spinal alignment without midline tenderness, spasm, deformity, or step-off. Patient seems to be exquisitely tender to light touch along the central spinal fossa. No appreciable swelling, bulging, or dislocation is appreciated. She transitions from sit to stand without difficulty. Normal lumbar flexion and extension range on exam. Negative seated straight leg raise. Nontender with normal range of motion in all extremities.  Neurologic: Cranial nerves II through XII grossly intact. Normal LE DTRs bilaterally. Normal gait without ataxia. Normal speech and language. No gross focal neurologic deficits are appreciated. Skin:  Skin is warm, dry and intact. No rash, blisters, or ecchymosis noted. Psychiatric: Mood and affect are normal. Patient exhibits appropriate insight and judgment. ____________________________________________   LABS (pertinent positives/negatives)  Labs Reviewed  POC URINE PREG, ED  POCT PREGNANCY, URINE  ____________________________________________   RADIOLOGY  Lumbar Spine    IMPRESSION: Negative.  I, Kriti Katayama, Charlesetta Ivory, personally viewed and evaluated these images (plain radiographs) as part of my medical decision making, as well as reviewing the written report by the radiologist. ____________________________________________  INITIAL IMPRESSION / ASSESSMENT AND PLAN / ED COURSE  Patient with an ED evaluation of acute midline low back pain without a previous history of injury or accident, or trauma. She is reassured by her exam and normal x-rays. She'll be discharged with prescriptions for Voltaren and Flexeril doses directed. She will follow with her primary care provider for ongoing worsening symptoms. Return precautions are reviewed. ____________________________________________  FINAL CLINICAL IMPRESSION(S) / ED DIAGNOSES  Final diagnoses:  Acute midline low back pain without sciatica      Karmen Stabs, Charlesetta Ivory, PA-C 02/02/17 Paulo Fruit    Loleta Rose, MD 02/02/17 [phone]

## 2017-02-02 NOTE — ED Notes (Signed)
See triage note   States she developed pain from mid back which radiates into lower back  Denies any trauma or urinary sx's   Ambulates well to treatment room

## 2017-02-17 ENCOUNTER — Ambulatory Visit: Payer: Medicaid Other | Admitting: Family Medicine

## 2017-02-17 ENCOUNTER — Encounter: Payer: Self-pay | Admitting: Family Medicine

## 2017-02-17 ENCOUNTER — Ambulatory Visit (INDEPENDENT_AMBULATORY_CARE_PROVIDER_SITE_OTHER): Payer: Medicaid Other | Admitting: Family Medicine

## 2017-02-17 VITALS — BP 140/82 | HR 71 | Temp 98.4°F | Wt 204.0 lb

## 2017-02-17 DIAGNOSIS — N943 Premenstrual tension syndrome: Secondary | ICD-10-CM | POA: Insufficient documentation

## 2017-02-17 DIAGNOSIS — Z1211 Encounter for screening for malignant neoplasm of colon: Secondary | ICD-10-CM | POA: Diagnosis present

## 2017-02-17 DIAGNOSIS — R030 Elevated blood-pressure reading, without diagnosis of hypertension: Secondary | ICD-10-CM

## 2017-02-17 DIAGNOSIS — E7800 Pure hypercholesterolemia, unspecified: Secondary | ICD-10-CM | POA: Diagnosis not present

## 2017-02-17 MED ORDER — OMEPRAZOLE 40 MG PO CPDR: 40.0000 mg | DELAYED_RELEASE_CAPSULE | Freq: Every day | ORAL | 1 refills | Status: DC | PRN

## 2017-02-17 NOTE — Assessment & Plan Note (Signed)
No high readings recenlty

## 2017-02-17 NOTE — Patient Instructions (Signed)
Good to see you today!  Thanks for coming in.  Try the cholesterol Crestor 1/2 tab then move up to one tab daily after stopping the lipitor for 24 hours.  If cant do that then call me   Send it the stool cards  I will contact you about menstrual period treatment

## 2017-02-17 NOTE — Assessment & Plan Note (Addendum)
Mostly mood issues 2 days before period.  [facility] not present today.  Will ask them to call her.  Start prozac prior to menstrual periods

## 2017-02-17 NOTE — Assessment & Plan Note (Signed)
Likely a familial cause.  Asked her to retry the crestor to see if can tolerate

## 2017-02-17 NOTE — Progress Notes (Signed)
Subjective  Patient is presenting with the following illnesses  HYPERLIPIDEMIA Symptoms Chest pain on exertion:  no   Leg claudication:   no Medications (modifying factor): Compliance- not able to take crestor made her feel spacy Right upper quadrant pain- no  Muscle aches- no Duration - years   Timing - continuous    Component Value Date/Time   CHOL 341 (H) 11/18/2016 1134   TRIG 116 11/18/2016 1134   HDL 37 (L) 11/18/2016 1134   VLDL 28 01/09/2015 1040   CHOLHDL 9.2 (H) 11/18/2016 1134   CHOLHDL 9.9 01/09/2015 1040    Back pain Headache Resolved.  Using ibuprofen intermittently.  No weakness or dyuria or balance or vision problems or weakness   Elevated BP Not checking but has not been high at other visits in the system.  No chest pain or shortness of breath    PMS 2 days before menstrual periods is very irritable and lashes out verbally at children and then once menstrual periods starts is fine.  No particular pain or bloating   Chief Complaint noted Review of Symptoms - see HPI PMH - Smoking status noted.     Objective Vital Signs reviewed Psych:  Cognition and judgment appear intact. Alert, communicative  and cooperative with normal attention span and concentration. No apparent delusions, illusions, hallucinations  PHQ 9 = 7    Assessments/Plans  No problem-specific Assessment & Plan notes found for this encounter.   See Encounter view if individual problem A/Ps not visible See after visit summary for details of patient instuctions

## 2017-02-18 ENCOUNTER — Telehealth: Payer: Self-pay | Admitting: Family Medicine

## 2017-02-18 MED ORDER — FLUOXETINE HCL 20 MG PO TABS: ORAL_TABLET | ORAL | 3 refills | Status: DC

## 2017-02-18 NOTE — Telephone Encounter (Signed)
Dicussed how to take prozac for PMS She agrees Will call back to meet with [facility] if needed

## 2017-02-19 ENCOUNTER — Telehealth: Payer: Self-pay | Admitting: *Deleted

## 2017-02-19 MED ORDER — FLUOXETINE HCL 20 MG PO CAPS: ORAL_CAPSULE | ORAL | 2 refills | Status: DC

## 2017-02-19 NOTE — Telephone Encounter (Signed)
Prior Authorization received from [facility] pharmacy for fluoxetine tablet. [facility]ul[facility] are the fluox[facility] capsules.  Please change to capsule form.  Clovis PuMartin, Tamika L, RN

## 2017-02-19 NOTE — Telephone Encounter (Signed)
done

## 2017-02-21 LAB — FECAL OCCULT BLOOD, IMMUNOCHEMICAL: Fecal Occult Bld: NEGATIVE

## 2017-05-06 ENCOUNTER — Encounter: Payer: Self-pay | Admitting: Emergency Medicine

## 2017-05-06 ENCOUNTER — Emergency Department: Payer: Medicaid Other

## 2017-05-06 ENCOUNTER — Emergency Department
Admission: EM | Admit: 2017-05-06 | Discharge: 2017-05-06 | Disposition: A | Discharge status: Home / Self Care | Payer: Medicaid Other | Attending: Student in an Organized Health Care Education/Training Program | Admitting: Student in an Organized Health Care Education/Training Program

## 2017-05-06 DIAGNOSIS — Z7982 Long term (current) use of aspirin: Secondary | ICD-10-CM | POA: Diagnosis not present

## 2017-05-06 DIAGNOSIS — I10 Essential (primary) hypertension: Secondary | ICD-10-CM | POA: Insufficient documentation

## 2017-05-06 DIAGNOSIS — R1012 Left upper quadrant pain: Secondary | ICD-10-CM | POA: Diagnosis present

## 2017-05-06 DIAGNOSIS — Z79899 Other long term (current) drug therapy: Secondary | ICD-10-CM | POA: Insufficient documentation

## 2017-05-06 HISTORY — DX: Essential (primary) hypertension: I10

## 2017-05-06 LAB — CBC
HEMATOCRIT: 41.6 % (ref 35.0–47.0)
HEMOGLOBIN: 14.6 g/dL (ref 12.0–16.0)
MCH: 30.4 pg (ref 26.0–34.0)
MCHC: 35 g/dL (ref 32.0–36.0)
MCV: 86.9 fL (ref 80.0–100.0)
Platelets: 272 10*3/uL (ref 150–440)
RBC: 4.79 MIL/uL (ref 3.80–5.20)
RDW: 13.4 % (ref 11.5–14.5)
WBC: 7.9 10*3/uL (ref 3.6–11.0)

## 2017-05-06 LAB — COMPREHENSIVE METABOLIC PANEL
ALBUMIN: 4.4 g/dL (ref 3.5–5.0)
ALT: 17 U/L (ref 14–54)
AST: 23 U/L (ref 15–41)
Alkaline Phosphatase: 47 U/L (ref 38–126)
Anion gap: 9 (ref 5–15)
BUN: 11 mg/dL (ref 6–20)
CALCIUM: 9.3 mg/dL (ref 8.9–10.3)
CO2: 26 mmol/L (ref 22–32)
Chloride: 101 mmol/L (ref 101–111)
Creatinine, Ser: 0.63 mg/dL (ref 0.44–1.00)
GFR calc Af Amer: >60 mL/min (ref >60)
GFR calc non Af Amer: >60 mL/min (ref >60)
GLUCOSE: 120 mg/dL — AB (ref 65–99)
POTASSIUM: 3.8 mmol/L (ref 3.5–5.1)
Sodium: 136 mmol/L (ref 135–145)
TOTAL PROTEIN: 8.2 g/dL — AB (ref 6.5–8.1)
Total Bilirubin: 0.8 mg/dL (ref 0.3–1.2)

## 2017-05-06 LAB — URINALYSIS, COMPLETE (UACMP) WITH MICROSCOPIC
BACTERIA UA: NONE SEEN
BILIRUBIN URINE: NEGATIVE
Glucose, UA: NEGATIVE mg/dL
Ketones, ur: NEGATIVE mg/dL
Leukocytes, UA: NEGATIVE
NITRITE: NEGATIVE
Protein, ur: NEGATIVE mg/dL
SPECIFIC GRAVITY, URINE: 1.006 (ref 1.005–1.030)
pH: 7 (ref 5.0–8.0)

## 2017-05-06 LAB — POCT PREGNANCY, URINE: PREG TEST UR: NEGATIVE

## 2017-05-06 LAB — LIPASE, BLOOD: LIPASE: 26 U/L (ref 11–51)

## 2017-05-06 MED ORDER — MORPHINE SULFATE (PF) 4 MG/ML IV SOLN
4.0000 mg | INTRAVENOUS | Status: DC | PRN
  Filled 2017-05-06: qty 1

## 2017-05-06 MED ORDER — SODIUM CHLORIDE 0.9 % IV BOLUS (SEPSIS)
1000.0000 mL | Freq: Once | INTRAVENOUS | Status: AC
  Administered 2017-05-06: 1000 mL via INTRAVENOUS

## 2017-05-06 MED ORDER — TRAMADOL HCL 50 MG PO TABS: 50.0000 mg | ORAL_TABLET | Freq: Four times a day (QID) | ORAL | 0 refills | Status: DC | PRN

## 2017-05-06 MED ORDER — IOPAMIDOL (ISOVUE-300) INJECTION 61%
100.0000 mL | Freq: Once | INTRAVENOUS | Status: AC | PRN
  Administered 2017-05-06: 100 mL via INTRAVENOUS

## 2017-05-06 MED ORDER — PROMETHAZINE HCL 25 MG/ML IJ SOLN
12.5000 mg | Freq: Four times a day (QID) | INTRAMUSCULAR | Status: DC | PRN
  Filled 2017-05-06: qty 1

## 2017-05-06 NOTE — ED Notes (Signed)
ED Provider at bedside. 

## 2017-05-06 NOTE — ED Triage Notes (Signed)
Patient presents to the ED with left lower quadrant pain that began yesterday.  Patient denies vomiting and diarrhea.  Patient reports some constipation.  Patient reports pain feels similar to when she has had diverticulitis in the past.  Patient is in no obvious distress at this time.

## 2017-05-06 NOTE — ED Provider Notes (Signed)
[facility] Emergency Department Provider Note    First MD Initiated Contact with Patient 05/06/17 1110     (approximate)  I have reviewed the triage vital signs and the nursing notes.   HISTORY  Chief Complaint Abdominal Pain    HPI Haley Mclaughlin is a 51 y.o. female history of diverticulitis as well as PID presents with chief complaint of several days of nausea and diarrhea and left upper quadrant abdominal pain. Has not had any measured fevers but has felt chills. States the pain became more severe yesterday.  No rashes. States it feels similar to his history of diverticulitis. No recent antibiotics. No dysuria. Denies any chest pain pregnant. No shortness of breath or chest pain.   Past Medical History:  Diagnosis Date  . Abnormal Pap smear   . Gestational diabetes    diet controlled with #3  . Hyperlipemia   . Hyperlipidemia   . Hypertension   . PID (pelvic inflammatory disease)   . Preterm labor    with last 4 preg, del at term  . Sarcoidosis   . Urinary tract infection    Family History  Problem Relation Age of Onset  . Heart disease Mother   . Hypertension Mother   . Diabetes Father   . Heart disease Father   . Anesthesia problems Neg Hx    Past Surgical History:  Procedure Laterality Date  . APPENDECTOMY    . INDUCED ABORTION     Patient Active Problem List   Diagnosis Date Noted  . Premenstrual syndrome 02/17/2017  . Elevated blood pressure reading 08/21/2016  . Hyperglycemia 06/05/2013  . 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 HYPERCHOLESTEROLEMIA 10/28/2006  . PANIC ATTACKS 10/28/2006  . PALPITATIONS 10/28/2006      Prior to Admission medications   Medication Sig Start Date End Date Taking? Authorizing Provider  acetaminophen (TYLENOL) 500 MG tablet Take 1,000 mg by mouth every 6 (six) hours as needed for moderate pain.   Yes [provider]  aspirin 81 MG tablet Take 81 mg by mouth daily.   Yes [provider]  FLUoxetine (PROZAC) 20 MG capsule Take one starting 1-2 days before mood changes start with your menstrual periods and take through the menstrual period 02/19/17  Yes Chambliss, Estill Batten, MD  fluticasone (FLONASE) 50 MCG/ACT nasal spray Place 1 spray into both nostrils daily as needed.    Yes [provider]  hydrocortisone 2.5 % ointment Apply topically 2 (two) times daily. 01/27/17  Yes Chambliss, Estill Batten, MD  ibuprofen (ADVIL,MOTRIN) 200 MG tablet Take 200 mg by mouth every 6 (six) hours as needed.   Yes [provider]  metoprolol tartrate (LOPRESSOR) 25 MG tablet Take 0.5 tablets (12.5 mg total) by mouth 2 (two) times daily. 01/21/17  Yes McKeag, Janine Ores, MD  omeprazole (PRILOSEC) 40 MG capsule Take 1 capsule (40 mg total) by mouth daily as needed. 02/17/17  Yes Carney Living, MD  ibuprofen (ADVIL,MOTRIN) 800 MG tablet Take 1 tablet (800 mg total) by mouth every 8 (eight) hours as needed for moderate pain. Patient not taking: Reported on 05/06/2017 03/09/15   Irean Hong, MD  rosuvastatin (CRESTOR) 40 MG tablet Take 1 tablet (40 mg total) by mouth daily. Patient not taking: Reported on 05/06/2017 01/27/17   Carney Living, MD  traMADol (ULTRAM) 50 MG tablet Take 1 tablet (50 mg total) by mouth every 6 (six) hours as needed. 05/06/17 05/06/18  Willy Eddy, MD  Allergies Sulfamethoxazole-trimethoprim    Social History Social History  Substance Use Topics  . Smoking status: Never Smoker  . Smokeless tobacco: Never Used  . Alcohol use No    Review of Systems Patient denies headaches, rhinorrhea, blurry vision, numbness, shortness of breath, chest pain, edema, cough, abdominal pain, nausea, vomiting, diarrhea, dysuria, fevers, rashes or hallucinations unless otherwise stated above in HPI. ____________________________________________   PHYSICAL EXAM:  VITAL SIGNS: Vitals:   05/06/17 1039  Pulse: 89  Resp: 18  Temp: 98.2 F (36.8 C)    SpO2: 97%    Constitutional: Alert and oriented. Well appearing and in no acute distress. Eyes: Conjunctivae are normal.  Head: Atraumatic. Nose: No congestion/rhinnorhea. Mouth/Throat: Mucous membranes are moist.   Neck: No stridor. Painless ROM.  Cardiovascular: Normal rate, regular rhythm. Grossly normal heart sounds.  Good peripheral circulation. Respiratory: Normal respiratory effort.  No retractions. Lungs CTAB. Gastrointestinal: Soft but + ttp of LUQ. No distention. No abdominal bruits. No CVA tenderness. Genitourinary:  Musculoskeletal: No lower extremity tenderness nor edema.  No joint effusions. Neurologic:  Normal speech and language. No gross focal neurologic deficits are appreciated. No facial droop Skin:  Skin is warm, dry and intact. No rash noted. Psychiatric: Mood and affect are normal. Speech and behavior are normal.  ____________________________________________   LABS (all labs ordered are listed, but only abnormal results are displayed)  Results for orders placed or performed during the hospital encounter of 05/06/17 (from the past 24 hour(s))  Lipase, blood     Status: None   Collection Time: 05/06/17 10:37 AM  Result Value Ref Range   Lipase 26 11 - 51 U/L  Comprehensive metabolic panel     Status: Abnormal   Collection Time: 05/06/17 10:37 AM  Result Value Ref Range   Sodium 136 135 - 145 mmol/L   Potassium 3.8 3.5 - 5.1 mmol/L   Chloride 101 101 - 111 mmol/L   CO2 26 22 - 32 mmol/L   Glucose, Bld 120 (H) 65 - 99 mg/dL   BUN 11 6 - 20 mg/dL   Creatinine, Ser 1.610.63 0.44 - 1.00 mg/dL   Calcium 9.3 8.9 - 09.610.3 mg/dL   Total Protein 8.2 (H) 6.5 - 8.1 g/dL   Albumin 4.4 3.5 - 5.0 g/dL   AST 23 15 - 41 U/L   ALT 17 14 - 54 U/L   Alkaline Phosphatase 47 38 - 126 U/L   Total Bilirubin 0.8 0.3 - 1.2 mg/dL   GFR calc non Af Amer >60 >60 mL/min   GFR calc Af Amer >60 >60 mL/min   Anion gap 9 5 - 15  CBC     Status: None   Collection Time: 05/06/17 10:37 AM   Result Value Ref Range   WBC 7.9 3.6 - 11.0 K/uL   RBC 4.79 3.80 - 5.20 MIL/uL   Hemoglobin 14.6 12.0 - 16.0 g/dL   HCT 04.541.6 40.935.0 - 81.147.0 %   MCV 86.9 80.0 - 100.0 fL   MCH 30.4 26.0 - 34.0 pg   MCHC 35.0 32.0 - 36.0 g/dL   RDW 91.413.4 78.211.5 - 95.614.5 %   Platelets 272 150 - 440 K/uL  Urinalysis, Complete w Microscopic     Status: Abnormal   Collection Time: 05/06/17 10:37 AM  Result Value Ref Range   Color, Urine STRAW (A) YELLOW   APPearance CLEAR (A) CLEAR   Specific Gravity, Urine 1.006 1.005 - 1.030   pH 7.0 5.0 - 8.0   Glucose,  UA NEGATIVE NEGATIVE mg/dL   Hgb urine dipstick SMALL (A) NEGATIVE   Bilirubin Urine NEGATIVE NEGATIVE   Ketones, ur NEGATIVE NEGATIVE mg/dL   Protein, ur NEGATIVE NEGATIVE mg/dL   Nitrite NEGATIVE NEGATIVE   Leukocytes, UA NEGATIVE NEGATIVE   RBC / HPF 0-5 0 - 5 RBC/hpf   WBC, UA 0-5 0 - 5 WBC/hpf   Bacteria, UA NONE SEEN NONE SEEN   Squamous Epithelial / LPF 0-5 (A) NONE SEEN  Pregnancy, urine POC     Status: None   Collection Time: 05/06/17 10:42 AM  Result Value Ref Range   Preg Test, Ur NEGATIVE NEGATIVE   ____________________________________________  EKG My review and personal interpretation at Time: 12:46   Indication: abd pain  Rate: 75  Rhythm: sinus Axis: normal Other: non specific st changes, no stemi, normal intervals ____________________________________________  RADIOLOGY  I personally reviewed all radiographic images ordered to evaluate for the above acute complaints and reviewed radiology reports and findings.  These findings were personally discussed with the patient.  Please see medical record for radiology report.  ____________________________________________   PROCEDURES  Procedure(s) performed:  Procedures    Critical Care performed: no ____________________________________________   INITIAL IMPRESSION / ASSESSMENT AND PLAN / ED COURSE  Pertinent labs & imaging results that were available during my care of the  patient were reviewed by me and considered in my medical decision making (see chart for details).  DDX: diverticultiis, abscess, perf, sbo, colitis, shingles, pyelo   Haley Mclaughlin is a 51 y.o. who presents to the ED with left upper quadrant abdominal pain as described above. She is otherwise afebrile hemodynamically stable. Blood work is fairly reassuring but the patient does have tenderness on exam therefore will order CT imaging to further characterize.  Will provide IV fluids, pain medication, antiemetics.  The patient will be placed on continuous pulse oximetry and telemetry for monitoring.  Laboratory evaluation will be sent to evaluate for the above complaints.     Clinical Course as of May 06 1253  Thu May 06, 2017  1242 CT imaging shows evidence of acute diverticulitis perforation or abscess. There is no evidence of stone. She has no evidence of pyelonephritis or kidney stone. Repeat abdominal exam is soft and benign. Patient otherwise well appearing and I do believe that she is appropriate for a trial of outpatient management.  Have discussed with the patient and available family all diagnostics and treatments performed thus far and all questions were answered to the best of my ability. The patient demonstrates understanding and agreement with plan.   [PR]    Clinical Course User Index [PR] Willy Eddy, MD     ____________________________________________   FINAL CLINICAL IMPRESSION(S) / ED DIAGNOSES  Final diagnoses:  Left upper quadrant pain      NEW MEDICATIONS STARTED DURING THIS VISIT:  New Prescriptions   TRAMADOL (ULTRAM) 50 MG TABLET    Take 1 tablet (50 mg total) by mouth every 6 (six) hours as needed.     Note:  This document was prepared using Dragon voice recognition software and may include unintentional dictation errors.    Willy Eddy, MD 05/06/17 1259

## 2017-05-06 NOTE — Discharge Instructions (Signed)

## 2017-05-21 ENCOUNTER — Telehealth: Payer: Self-pay | Admitting: *Deleted

## 2017-05-21 NOTE — Telephone Encounter (Signed)
Patient called stating her blood sugars are low, 88-100s. Patient stated she feels dizzy at times. Offered appointment for today, but patient stated she lives in [place] would not be able to get to clinic on time. She also has to wait until her kids get off the bus.  Advised patient is she develop any symptoms such as dizziness, increase thirst, headache, increase heart rate, sweating to drink  OJ or eat some hard candy.  Patient should also to call after hour line for [facility] or go to urgent care.  Patient scheduled an appointment for Tuesday 05/25/17 at 2:30 PM. Will forward to PCP. Clovis Pu, RN

## 2017-05-21 NOTE — Telephone Encounter (Signed)
Reviewed.  Agree with recommendation.

## 2017-05-25 ENCOUNTER — Ambulatory Visit: Payer: Medicaid Other | Admitting: Internal Medicine

## 2017-05-27 ENCOUNTER — Encounter: Payer: Self-pay | Admitting: Internal Medicine

## 2017-05-27 ENCOUNTER — Ambulatory Visit (INDEPENDENT_AMBULATORY_CARE_PROVIDER_SITE_OTHER): Payer: Medicaid Other | Admitting: Internal Medicine

## 2017-05-27 VITALS — BP 104/80 | HR 100 | Temp 98.1°F | Ht 64.0 in | Wt 206.0 lb

## 2017-05-27 DIAGNOSIS — E162 Hypoglycemia, unspecified: Secondary | ICD-10-CM | POA: Diagnosis present

## 2017-05-27 DIAGNOSIS — R21 Rash and other nonspecific skin eruption: Secondary | ICD-10-CM | POA: Diagnosis not present

## 2017-05-27 DIAGNOSIS — Z7689 Persons encountering health services in other specified circumstances: Secondary | ICD-10-CM

## 2017-05-27 MED ORDER — TRIAMCINOLONE ACETONIDE 0.5 % EX OINT: 1.0000 "application " | TOPICAL_OINTMENT | Freq: Two times a day (BID) | CUTANEOUS | 3 refills | Status: DC

## 2017-05-27 NOTE — Assessment & Plan Note (Signed)
Ongoing for >1 yr with frequent flares. Most likely 2/2 sarcoidosis. No improvement with 2.5% hydrocortisone. Less likely allergic, as distribution of rash and chronicity not consistent with this. Appearance consistent with papular cutaneous sarcoidosis, however lack of rash on face and presence primarily on hands inconsistent with this. May improve with higher potency steroid, however biopsy obtained today prior to beginning steroids to be sent for pathology.  - Punch biopsy sent for path today. Will call patient with results.  - Begin Kenalog 0.5% BID to affected regions

## 2017-05-27 NOTE — Assessment & Plan Note (Signed)
Patient reporting symptoms consistent with hypoglycemia, though lowest recorded blood sugar 88. Normally in low 100s. Patient previously diagnosed with pre-diabetes and was started on metformin, however has not been on this for a while. Last A1C 5.3. Possibly due to recent changes in patient's due in attempt to improve cholesterol and lose weight. Discussed with patient that CBGs in high 80s-low 100s are not dangerous, though could be symptomatic if lower than her baseline. Encouraged to continue drinking orange juice if experiencing these symptoms. Think patient would benefit from nutrition consultation, to possibly identify causes of hypoglycemia and to help with weight management.  - Referral to Dr. Gerilyn Pilgrim placed. Patient to call to schedule appt.

## 2017-05-27 NOTE — Patient Instructions (Addendum)
It was nice meeting you today Haley Mclaughlin!  I will call to let you know the results of your biopsy once they have resulted.   In the meantime, please begin applying the Kenalog steroid ointment to areas with the rash twice a day.   I have placed a referral to nutrition for you. Please call Dr. Gerilyn Pilgrim at the number listed on her card to schedule this appointment.   If you feel your blood sugar is low, you can continue to drink orange juice or eat something sugar as you have been.   If you have any questions or concerns, please feel free to call the clinic.   Be well,  Dr. Natale Milch

## 2017-05-27 NOTE — Progress Notes (Signed)
Subjective:   Patient: Haley Mclaughlin       Birthdate: [birth year]       MRN: [id number]      HPI  Haley Mclaughlin is a 51 y.o. female presenting for low blood sugar and rash.   Low blood sugar Patient reports recently low blood sugars of <110, with lowest of 88. Occurring over the past 3 weeks. Says that when her blood sugar goes below 110, she begins to feel very shaky and generally unwell. When she feels this way, she checks her blood sugar, and if it is lower than she thinks it should be, she drinks orange juice. This typically improves her symptoms quickly. Was told in past that she was prediabetic and placed on metformin, however she lost weight, and last A1C was 5.8 on 10/2016, so this was discontinued. She has recently made some dietary adjustments in an effort to lose weight and improve her cholesterol. She is trying to cut carbs out of her diet, and is also trying to eat less protein, as she is concerned this will raise her cholesterol. She would like to speak with a nutritionist. She called to schedule an appt with Dr. Gerilyn Pilgrim but was told she needed to be seen by a provider first.   Rash Present for greater than a year. Located primarily on her hands, including fingers but not palms. Rash is present constantly but frequently flares. Flares last for 3-4 days. At baseline, rash is flat, but becomes raised during flares. Is painful and burns, with occasional itching. Was prescribed hydrocortisone 2.5% cream by Dr. Deirdre Priest which did not improve rash at all. Has been using an OTC diabetic skin lotion which provides some symptomatic relief but does not resolve rash. Cannot identify any precipitating or worsening factors. Also has a rash on her feet which looks different and is not painful. Has history of sarcoidosis with Loeffler's Syndrome. Is followed by [facility] for this and does not require medication anymore. Has never been told she has cutaneous manifestations of her sarcoidosis.   Smoking  status reviewed. Patient is never smoker.   Review of Systems See HPI.     Objective:  Physical Exam  Constitutional: She is oriented to person, place, and time and well-developed, well-nourished, and in no distress.  HENT:  Head: Normocephalic and atraumatic.  Eyes: Conjunctivae and EOM are normal. Right eye exhibits no discharge. Left eye exhibits no discharge.  Cardiovascular: Normal rate, regular rhythm and normal heart sounds.   No murmur heard. Pulmonary/Chest: Effort normal and breath sounds normal. No respiratory distress. She has no wheezes.  Musculoskeletal: Normal range of motion.  Neurological: She is alert and oriented to person, place, and time.  Skin:  Erythematous papular rash present on hands bilaterally, including fingers but excluding palms and web spaces, and extending to forearms. No vesicles or drainage. Non-tender to palpation. Some areas confluent.   Small round erythematous macules present on feet and around ankles bilaterally. Non-tender to palpation.   Skin otherwise warm and dry.   Psychiatric: Affect and judgment normal.   Procedure Note: Punch Biopsy Written and Verbal Consent obtained. Risks and benefits of procedure discussed with patient. Area prepped in sterile fashion. Using a 5 cc syringe and 25 gauge 1.5 inch needle approximately 1 cc of 1% Lidocaine with Epinephrine was instilled into the lesion. A 3 mm punch was performed. Using pickups and iris scissors the biopsy specimen was removed. Hemostasis was achieved. No complications. Band-aid applied. Specimen sent for pathology.  Assessment & Plan:  Low blood sugar Patient reporting symptoms consistent with hypoglycemia, though lowest recorded blood sugar 88. Normally in low 100s. Patient previously diagnosed with pre-diabetes and was started on metformin, however has not been on this for a while. Last A1C 5.3. Possibly due to recent changes in patient's due in attempt to improve cholesterol and  lose weight. Discussed with patient that CBGs in high 80s-low 100s are not dangerous, though could be symptomatic if lower than her baseline. Encouraged to continue drinking orange juice if experiencing these symptoms. Think patient would benefit from nutrition consultation, to possibly identify causes of hypoglycemia and to help with weight management.  - Referral to Dr. Gerilyn Pilgrim placed. Patient to call to schedule appt.   Rash and nonspecific skin eruption Ongoing for >1 yr with frequent flares. Most likely 2/2 sarcoidosis. No improvement with 2.5% hydrocortisone. Less likely allergic, as distribution of rash and chronicity not consistent with this. Appearance consistent with papular cutaneous sarcoidosis, however lack of rash on face and presence primarily on hands inconsistent with this. May improve with higher potency steroid, however biopsy obtained today prior to beginning steroids to be sent for pathology.  - Punch biopsy sent for path today. Will call patient with results.  - Begin Kenalog 0.5% BID to affected regions  Precepted with Dr. Gwendolyn Grant.   Tarri Abernethy, MD, MPH PGY-3 [facility] Pager [phone]

## 2017-06-01 ENCOUNTER — Telehealth: Payer: Self-pay | Admitting: Internal Medicine

## 2017-06-01 DIAGNOSIS — R21 Rash and other nonspecific skin eruption: Secondary | ICD-10-CM

## 2017-06-01 NOTE — Telephone Encounter (Signed)
Called patient regarding biopsy results. Informed patient that biopsy was not consistent with sarcoidosis, but also did not suggest what the rash might actually be. Patient said she has not had much improvement with Kenalog. As no improvement with Kenalog and little information from biopsy, will refer to derm for further management. Patient amenable to this plan. Derm referral placed. Patient to be called with date and time of appt.   Tarri Abernethy, MD, MPH PGY-3 [facility] Pager [phone]

## 2017-07-01 ENCOUNTER — Encounter: Payer: Self-pay | Admitting: Internal Medicine

## 2017-07-01 ENCOUNTER — Ambulatory Visit (INDEPENDENT_AMBULATORY_CARE_PROVIDER_SITE_OTHER): Payer: Medicaid Other | Admitting: Internal Medicine

## 2017-07-01 VITALS — BP 160/90 | HR 77 | Temp 98.3°F | Wt 210.0 lb

## 2017-07-01 DIAGNOSIS — N390 Urinary tract infection, site not specified: Secondary | ICD-10-CM | POA: Insufficient documentation

## 2017-07-01 DIAGNOSIS — R030 Elevated blood-pressure reading, without diagnosis of hypertension: Secondary | ICD-10-CM

## 2017-07-01 DIAGNOSIS — R300 Dysuria: Secondary | ICD-10-CM | POA: Diagnosis present

## 2017-07-01 DIAGNOSIS — N3000 Acute cystitis without hematuria: Secondary | ICD-10-CM | POA: Diagnosis not present

## 2017-07-01 LAB — POCT UA - MICROSCOPIC ONLY: WBC, Ur, HPF, POC: >20

## 2017-07-01 LAB — POCT URINALYSIS DIP (MANUAL ENTRY)
BILIRUBIN UA: NEGATIVE
BILIRUBIN UA: NEGATIVE mg/dL
GLUCOSE UA: NEGATIVE mg/dL
Nitrite, UA: NEGATIVE
SPEC GRAV UA: 1.015 (ref 1.010–1.025)
Urobilinogen, UA: 0.2 E.U./dL
pH, UA: 6.5 (ref 5.0–8.0)

## 2017-07-01 MED ORDER — CEPHALEXIN 500 MG PO CAPS: 500.0000 mg | ORAL_CAPSULE | Freq: Four times a day (QID) | ORAL | 0 refills | Status: DC

## 2017-07-01 NOTE — Patient Instructions (Addendum)
It was so nice to meet you!  You have a urinary tract infection. I have prescribed an antibiotic called Keflex. Please take this 4 times a day for the next 7 days.  If you start having fevers or feeling worse, please come back to see us!  -Dr. Nancy MarusMayo

## 2017-07-01 NOTE — Assessment & Plan Note (Signed)
UA with 2+ LE, negative nitrites, 30 protein, small RBCs.  - Treat with Keflex 500mg  qid x 7 days - Return precautions discussed - Follow-up if no improvement

## 2017-07-01 NOTE — Assessment & Plan Note (Signed)
BP elevated in clinic today to 160/90. Did not take her BP medication today. - Advised patient to follow-up with PCP. Should take BP medication prior to that appointment.

## 2017-07-01 NOTE — Progress Notes (Signed)
Urine

## 2017-07-01 NOTE — Progress Notes (Signed)
   [facility] Phone: [phone]  Subjective:  [name] is a 51 year old female presenting to clinic with dysuria and urinary frequency for the last 3 days. She also notes that she has had a hard time starting a urine stream over the last day. She denies any fevers. She endorses some nausea and suprapubic abdominal pain. No vomiting.    ROS: See HPI for pertinent positives and negatives  Past Medical History- Loeffler syndrome, HLD, panic attacks, HTN  Family history reviewed for today's visit. No changes.  Social history- patient is a never smoker  Objective: BP (!) 160/90   Pulse 77   Temp 98.3 F (36.8 C) (Oral)   Wt 210 lb (95.3 kg)   LMP 06/08/2017   SpO2 98%   BMI 36.05 kg/m  Gen: NAD, alert, cooperative with exam GI: +suprapubic tenderness, soft, non-distended, no rebound, no guarding. Back: No CVA tenderness  Assessment/Plan: UTI: UA with 2+ LE, negative nitrites, 30 protein, small RBCs.  - Treat with Keflex 500mg  qid x 7 days - Return precautions discussed - Follow-up if no improvement   HTN: BP elevated in clinic today to 160/90. Did not take her BP medication today. - Advised patient to follow-up with PCP. Should take BP medication prior to that appointment.   Willadean CarolKaty Jaquia Benedicto, MD PGY-3

## 2017-09-06 ENCOUNTER — Other Ambulatory Visit: Payer: Self-pay | Admitting: Family Medicine

## 2017-09-06 DIAGNOSIS — Z1231 Encounter for screening mammogram for malignant neoplasm of breast: Secondary | ICD-10-CM

## 2017-09-24 ENCOUNTER — Ambulatory Visit: Payer: Medicaid Other

## 2017-11-09 ENCOUNTER — Ambulatory Visit
Admission: RE | Admit: 2017-11-09 | Discharge: 2017-11-09 | Disposition: A | Discharge status: Home / Self Care | Payer: Medicaid Other | Source: Ambulatory Visit | Attending: Family Medicine | Admitting: Family Medicine

## 2017-11-09 DIAGNOSIS — Z1231 Encounter for screening mammogram for malignant neoplasm of breast: Secondary | ICD-10-CM

## 2017-11-30 ENCOUNTER — Telehealth: Payer: Self-pay | Admitting: Family Medicine

## 2017-11-30 NOTE — Telephone Encounter (Signed)
Spoke to patient about colonoscopy; said that she was interested in the stool sample.  She will call the lab to get the kit for it

## 2017-12-06 ENCOUNTER — Emergency Department: Payer: Medicaid Other

## 2017-12-06 ENCOUNTER — Emergency Department
Admission: EM | Admit: 2017-12-06 | Discharge: 2017-12-06 | Disposition: A | Discharge status: Home / Self Care | Payer: Medicaid Other | Attending: Emergency Medicine | Admitting: Emergency Medicine

## 2017-12-06 ENCOUNTER — Encounter: Payer: Self-pay | Admitting: Emergency Medicine

## 2017-12-06 DIAGNOSIS — R1031 Right lower quadrant pain: Secondary | ICD-10-CM

## 2017-12-06 DIAGNOSIS — Z7982 Long term (current) use of aspirin: Secondary | ICD-10-CM | POA: Insufficient documentation

## 2017-12-06 DIAGNOSIS — R102 Pelvic and perineal pain: Secondary | ICD-10-CM

## 2017-12-06 DIAGNOSIS — Z79899 Other long term (current) drug therapy: Secondary | ICD-10-CM | POA: Insufficient documentation

## 2017-12-06 DIAGNOSIS — I10 Essential (primary) hypertension: Secondary | ICD-10-CM | POA: Diagnosis not present

## 2017-12-06 LAB — URINALYSIS, COMPLETE (UACMP) WITH MICROSCOPIC
BACTERIA UA: NONE SEEN
Bilirubin Urine: NEGATIVE
Glucose, UA: NEGATIVE mg/dL
Ketones, ur: NEGATIVE mg/dL
Leukocytes, UA: NEGATIVE
NITRITE: NEGATIVE
PH: 5 (ref 5.0–8.0)
Protein, ur: NEGATIVE mg/dL
RBC / HPF: NONE SEEN RBC/hpf (ref 0–5)
SPECIFIC GRAVITY, URINE: 1.01 (ref 1.005–1.030)

## 2017-12-06 LAB — CBC
HEMATOCRIT: 40.1 % (ref 35.0–47.0)
Hemoglobin: 13.7 g/dL (ref 12.0–16.0)
MCH: 29.9 pg (ref 26.0–34.0)
MCHC: 34.2 g/dL (ref 32.0–36.0)
MCV: 87.4 fL (ref 80.0–100.0)
Platelets: 243 10*3/uL (ref 150–440)
RBC: 4.59 MIL/uL (ref 3.80–5.20)
RDW: 13.1 % (ref 11.5–14.5)
WBC: 4.9 10*3/uL (ref 3.6–11.0)

## 2017-12-06 LAB — BASIC METABOLIC PANEL
Anion gap: 6 (ref 5–15)
BUN: 11 mg/dL (ref 6–20)
CHLORIDE: 107 mmol/L (ref 101–111)
CO2: 25 mmol/L (ref 22–32)
Calcium: 8.9 mg/dL (ref 8.9–10.3)
Creatinine, Ser: 0.78 mg/dL (ref 0.44–1.00)
GFR calc non Af Amer: >60 mL/min (ref >60)
Glucose, Bld: 191 mg/dL — ABNORMAL HIGH (ref 65–99)
POTASSIUM: 3.8 mmol/L (ref 3.5–5.1)
SODIUM: 138 mmol/L (ref 135–145)

## 2017-12-06 LAB — PREGNANCY, URINE: Preg Test, Ur: NEGATIVE

## 2017-12-06 MED ORDER — IBUPROFEN 400 MG PO TABS
600.0000 mg | ORAL_TABLET | Freq: Once | ORAL | Status: AC
  Administered 2017-12-06: 600 mg via ORAL
  Filled 2017-12-06: qty 2

## 2017-12-06 MED ORDER — IBUPROFEN 600 MG PO TABS: 600.0000 mg | ORAL_TABLET | Freq: Four times a day (QID) | ORAL | 0 refills | Status: DC | PRN

## 2017-12-06 MED ORDER — TRAMADOL HCL 50 MG PO TABS: 50.0000 mg | ORAL_TABLET | Freq: Four times a day (QID) | ORAL | 0 refills | Status: AC | PRN

## 2017-12-06 NOTE — ED Provider Notes (Signed)
[facility] Emergency Department Provider Note ____________________________________________   First MD Initiated Contact with Patient 12/06/17 1422     (approximate)  I have reviewed the triage vital signs and the nursing notes.   HISTORY  Chief Complaint Flank Pain    HPI Haley Mclaughlin is a 52 y.o. female with past medical history as noted below who presents with right lower quadrant pain for the last 4 days, initially intermittent but now constant, nonradiating, and not associated with nausea, vomiting, diarrhea or change in bowel movements, vaginal bleeding, or discharge.  No urinary symptoms.  No prior history of this pain.  Patient is status post appendectomy.   Past Medical History:  Diagnosis Date  . Abnormal Pap smear   . Gestational diabetes    diet controlled with #3  . Hyperlipemia   . Hyperlipidemia   . Hypertension   . PID (pelvic inflammatory disease)   . Preterm labor    with last 4 preg, del at term  . Sarcoidosis   . Urinary tract infection     Patient Active Problem List   Diagnosis Date Noted  . UTI (urinary tract infection) 07/01/2017  . Low blood sugar 05/27/2017  . Rash and nonspecific skin eruption 05/27/2017  . Premenstrual syndrome 02/17/2017  . Elevated blood pressure reading 08/21/2016  . Hyperglycemia 06/05/2013  . 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 HYPERCHOLESTEROLEMIA 10/28/2006  . PANIC ATTACKS 10/28/2006  . PALPITATIONS 10/28/2006    Past Surgical History:  Procedure Laterality Date  . APPENDECTOMY    . INDUCED ABORTION      Prior to Admission medications   Medication Sig Start Date End Date Taking? Authorizing Provider  acetaminophen (TYLENOL) 500 MG tablet Take 1,000 mg by mouth every 6 (six) hours as needed for moderate pain.    [provider]  aspirin 81 MG tablet Take 81 mg by mouth daily.    [provider]  cephALEXin (KEFLEX) 500 MG capsule Take 1 capsule (500 mg total)  by mouth 4 (four) times daily. 07/01/17   Mayo, Allyn Kenner, MD  FLUoxetine (PROZAC) 20 MG capsule Take one starting 1-2 days before mood changes start with your menstrual periods and take through the menstrual period 02/19/17   Carney Living, MD  fluticasone (FLONASE) 50 MCG/ACT nasal spray Place 1 spray into both nostrils daily as needed.     [provider]  hydrocortisone 2.5 % ointment Apply topically 2 (two) times daily. 01/27/17   Carney Living, MD  ibuprofen (ADVIL,MOTRIN) 600 MG tablet Take 1 tablet (600 mg total) by mouth every 6 (six) hours as needed. 12/06/17   Dionne Bucy, MD  metoprolol tartrate (LOPRESSOR) 25 MG tablet Take 0.5 tablets (12.5 mg total) by mouth 2 (two) times daily. 01/21/17   McKeag, Janine Ores, MD  omeprazole (PRILOSEC) 40 MG capsule Take 1 capsule (40 mg total) by mouth daily as needed. 02/17/17   Carney Living, MD  rosuvastatin (CRESTOR) 40 MG tablet Take 1 tablet (40 mg total) by mouth daily. Patient not taking: Reported on 05/06/2017 01/27/17   Carney Living, MD  traMADol (ULTRAM) 50 MG tablet Take 1 tablet (50 mg total) by mouth every 6 (six) hours as needed for up to 3 days for moderate pain. 12/06/17 12/09/17  Dionne Bucy, MD  triamcinolone ointment (KENALOG) 0.5 % Apply 1 application topically 2 (two) times daily. 05/27/17   Marquette Saa, MD    Allergies Sulfamethoxazole-trimethoprim  Family History  Problem Relation Age of Onset  . Heart disease Mother   . Hypertension Mother   . Diabetes Father   . Heart disease Father   . Anesthesia problems Neg Hx     Social History Social History   Tobacco Use  . Smoking status: Never Smoker  . Smokeless tobacco: Never Used  Substance Use Topics  . Alcohol use: No  . Drug use: No    Review of Systems  Constitutional: No fever. Eyes: No redness. ENT: No neck pain. Cardiovascular: Denies chest pain. Respiratory: Denies shortness of  breath. Gastrointestinal: No vomiting, no diarrhea. Genitourinary: Negative for dysuria or hematuria.  Musculoskeletal: Negative for back pain. Skin: Negative for rash. Neurological: Negative for headache.   ____________________________________________   PHYSICAL EXAM:  VITAL SIGNS: ED Triage Vitals  Enc Vitals Group     BP 12/06/17 1133 (!) 156/99     Pulse Rate 12/06/17 1133 93     Resp 12/06/17 1133 20     Temp 12/06/17 1133 98 F (36.7 C)     Temp Source 12/06/17 1133 Oral     SpO2 12/06/17 1133 97 %     Weight 12/06/17 1134 215 lb (97.5 kg)     Height 12/06/17 1134  (1.626 m)     Head Circumference --      Peak Flow --      Pain Score 12/06/17 1133 4     Pain Loc --      Pain Edu? --      Excl. in GC? --     Constitutional: Alert and oriented. Well appearing and in no acute distress. Eyes: Conjunctivae are normal.  Head: Atraumatic. Nose: No congestion/rhinnorhea. Mouth/Throat: Mucous membranes are moist.   Neck: Normal range of motion.  Cardiovascular:   Good peripheral circulation. Respiratory: Normal respiratory effort.  No retractions.  Gastrointestinal: Soft with mild focal right lateral lower quadrant tenderness. No distention.  Genitourinary: No CVA tenderness. Musculoskeletal: No lower extremity edema.  Extremities warm and well perfused.  Neurologic:  Normal speech and language. No gross focal neurologic deficits are appreciated.  Skin:  Skin is warm and dry. No rash noted. Psychiatric: Mood and affect are normal. Speech and behavior are normal.  ____________________________________________   LABS (all labs ordered are listed, but only abnormal results are displayed)  Labs Reviewed  URINALYSIS, COMPLETE (UACMP) WITH MICROSCOPIC - Abnormal; Notable for the following components:      Result Value   Color, Urine YELLOW (*)    APPearance HAZY (*)    Hgb urine dipstick SMALL (*)    Squamous Epithelial / LPF 6-30 (*)    All other components  within normal limits  BASIC METABOLIC PANEL - Abnormal; Notable for the following components:   Glucose, Bld 191 (*)    All other components within normal limits  CBC  PREGNANCY, URINE   ____________________________________________  EKG   ____________________________________________  RADIOLOGY  CT abdomen: Right nephrolithiasis but no ureteral stones, hydronephrosis, or other acute findings  US pelvis: No acute findings  ____________________________________________   PROCEDURES  Procedure(s) performed: No  Procedures  Critical Care performed: No ____________________________________________   INITIAL IMPRESSION / ASSESSMENT AND PLAN / ED COURSE  Pertinent labs & imaging results that were available during my care of the patient were reviewed by me and considered in my medical decision making (see chart for details).  52 year old female with PMH as noted above and who is status post appendectomy presents with right lower quadrant pain (not  really flank or back pain) for the last 4 days.  No significant associated symptoms.  Patient is mildly tender on exam.  Differential includes ureteral stone, pelvic etiology such as ovarian cyst or fibroid, or less likely right-sided colitis or other GI cause.  Will obtain CT stone study, and if negative, pelvic ultrasound.      ----------------------------------------- 4:38 PM on 12/06/2017 -----------------------------------------  CT showed small renal stones, but no ureteral stones or other findings to suggest a specific cause of the pain.  Ultrasound was then performed which was also negative.  I suspect that this could actually be muscular abdominal wall pain, versus other nonspecific benign causes.  There is no indication for further acute workup or intervention at this time.  The patient continues to appear relatively well, and feels okay to go home.  I gave her thorough return precautions, and she expressed understanding.   She agrees to follow-up with her primary care doctor.  ____________________________________________   FINAL CLINICAL IMPRESSION(S) / ED DIAGNOSES  Final diagnoses:  Pelvic pain  Right lower quadrant pain      NEW MEDICATIONS STARTED DURING THIS VISIT:  New Prescriptions   IBUPROFEN (ADVIL,MOTRIN) 600 MG TABLET    Take 1 tablet (600 mg total) by mouth every 6 (six) hours as needed.   TRAMADOL (ULTRAM) 50 MG TABLET    Take 1 tablet (50 mg total) by mouth every 6 (six) hours as needed for up to 3 days for moderate pain.     Note:  This document was prepared using Dragon voice recognition software and may include unintentional dictation errors.   Dionne Bucy, MD 12/06/17 [phone]

## 2017-12-06 NOTE — ED Notes (Signed)
Pt signed esignature.  D/c  inst to pt.  

## 2017-12-06 NOTE — ED Triage Notes (Signed)
Pt reports pain to right flank for the past 4 days. Pt reports hurts any times he moves or lays down. Denies urinary sx's.

## 2017-12-06 NOTE — Discharge Instructions (Addendum)
Return to the ER for new, worsening, or persistent severe pain, pain moving to other areas, fevers, vomiting or nausea, weakness or lightheadedness, or any other new or worsening symptoms that concern you.  Follow-up with your primary care doctor.  You can take the ibuprofen for pain, and you may take the tramadol for more severe pain or at night if you need it.

## 2017-12-21 ENCOUNTER — Encounter: Payer: Self-pay | Admitting: Family Medicine

## 2017-12-21 ENCOUNTER — Ambulatory Visit: Payer: Medicaid Other | Admitting: Family Medicine

## 2017-12-21 VITALS — BP 135/80 | HR 83 | Temp 98.7°F | Wt 215.0 lb

## 2017-12-21 DIAGNOSIS — Z01419 Encounter for gynecological examination (general) (routine) without abnormal findings: Secondary | ICD-10-CM

## 2017-12-21 DIAGNOSIS — R0789 Other chest pain: Secondary | ICD-10-CM | POA: Insufficient documentation

## 2017-12-21 DIAGNOSIS — G4700 Insomnia, unspecified: Secondary | ICD-10-CM | POA: Insufficient documentation

## 2017-12-21 DIAGNOSIS — Z1211 Encounter for screening for malignant neoplasm of colon: Secondary | ICD-10-CM | POA: Insufficient documentation

## 2017-12-21 DIAGNOSIS — E7800 Pure hypercholesterolemia, unspecified: Secondary | ICD-10-CM | POA: Diagnosis present

## 2017-12-21 DIAGNOSIS — F5101 Primary insomnia: Secondary | ICD-10-CM

## 2017-12-21 NOTE — Assessment & Plan Note (Signed)
May be due to sleep apnea - has SS scheduled.  Does not seem to have major depression but has struggled with anxiety for years. Will monitor

## 2017-12-21 NOTE — Progress Notes (Signed)
Subjective  Haley Mclaughlin is a 52 y.o. female is presenting with the following   INSOMNIA Episodes of EMA around 3-4 AM.  Has episodic times of feeling down or anxious but not debilitating and not new.  No suicidal ideation.  Appetite is unchanged.  Has history of sleep apnea does not tolerate CPAP.  Has a repeat sleep study coming up   CHEST DISCOMFORT Episodes at night of chest full feeling.  Not with exertion. Different from reflux symptoms which improve with prilosec  HYPERLIPIDEMIA Symptoms Chest pain on exertion:  no   Leg claudication:   no Medications (modifying factor): Compliance- has not taken statin for months.  Is very anxious when she takes it worrying about side effects despite knowing they are very rare and she took it before without problems Right upper quadrant pain- no  Muscle aches- her usual  Duration - years   Timing - continuous    Component Value Date/Time   CHOL 341 (H) 11/18/2016 1134   TRIG 116 11/18/2016 1134   HDL 37 (L) 11/18/2016 1134   VLDL 28 01/09/2015 1040   CHOLHDL 9.2 (H) 11/18/2016 1134   CHOLHDL 9.9 01/09/2015 1040    Chief Complaint noted Review of Symptoms - see HPI PMH - Smoking status noted.    Objective Vital Signs reviewed BP 135/80 (BP Location: Right Arm, Patient Position: Sitting, Cuff Size: Normal)   Pulse 83   Temp 98.7 F (37.1 C) (Oral)   Wt 215 lb (97.5 kg)   LMP 11/25/2017 (Exact Date)   SpO2 100%   BMI 36.90 kg/m   Heart - Regular rate and rhythm.  No murmurs, gallops or rubs.    Lungs:  Normal respiratory effort, chest expands symmetrically. Lungs are clear to auscultation, no crackles or wheezes. Abdomen: soft and non-tender without masses, organomegaly or hernias noted.  No guarding or rebound Extremities:  No cyanosis, edema, or deformity noted with good range of motion of all major joints.   Able to walk on heels and toes.   Balance normal  Psych:  Cognition and judgment appear intact. Alert, communicative   and cooperative with normal attention span and concentration. No apparent delusions, illusions, hallucinations  Assessments/Plans  See after visit summary for details of patient instuctions  HYPERCHOLESTEROLEMIA Not at goal  Does not want to take medications  See after visit summary   Insomnia May be due to sleep apnea - has SS scheduled.  Does not seem to have major depression but has struggled with anxiety for years. Will monitor   Chest discomfort Perhaps due to esophageal issues - reflux?   No signs of CAD.   [facility]. Will follow up

## 2017-12-21 NOTE — Patient Instructions (Addendum)
Good to see you today!  Thanks for coming in.  For your chest discomfort - minimize ibuprofen and have the barium swallow  For the sleep - have the sleep study  Consider taking a cholesterol medicine - you could meet with out behaviorist to overcome your anxiety   Send in the stool cards  Come back in one month to talk more

## 2017-12-21 NOTE — Assessment & Plan Note (Signed)
Not at goal  Does not want to take medications  See after visit summary

## 2017-12-21 NOTE — Assessment & Plan Note (Signed)
Perhaps due to esophageal issues - reflux?   No signs of CAD.   [facility] Will follow up

## 2017-12-23 ENCOUNTER — Encounter: Payer: Self-pay | Admitting: Family Medicine

## 2017-12-27 ENCOUNTER — Encounter: Payer: Self-pay | Admitting: Psychology

## 2017-12-27 ENCOUNTER — Other Ambulatory Visit: Payer: Self-pay | Admitting: Family Medicine

## 2017-12-27 DIAGNOSIS — E7800 Pure hypercholesterolemia, unspecified: Secondary | ICD-10-CM

## 2017-12-27 MED ORDER — ROSUVASTATIN CALCIUM 40 MG PO TABS: 40.0000 mg | ORAL_TABLET | Freq: Every day | ORAL | 1 refills | Status: DC

## 2017-12-28 ENCOUNTER — Other Ambulatory Visit: Payer: Self-pay

## 2017-12-28 DIAGNOSIS — Z1211 Encounter for screening for malignant neoplasm of colon: Secondary | ICD-10-CM

## 2017-12-31 LAB — FECAL OCCULT BLOOD, IMMUNOCHEMICAL: FECAL OCCULT BLD: NEGATIVE

## 2018-01-03 ENCOUNTER — Encounter: Payer: Self-pay | Admitting: Family Medicine

## 2018-01-03 ENCOUNTER — Ambulatory Visit: Payer: Medicaid Other | Admitting: Psychology

## 2018-01-03 DIAGNOSIS — F410 Panic disorder [episodic paroxysmal anxiety] without agoraphobia: Secondary | ICD-10-CM

## 2018-01-03 NOTE — Patient Instructions (Addendum)
Nanna: It was a pleasure to meet you today! As we discussed, it will be helpful to you to practice diaphragmatic breathing and progressive muscle relaxation each day at bedtime and once during the day. Also focus on sleep hygiene: go to bed at the same time each night; get up at same time each morning. If middle insomnia, get out of bed and read something light/boring like the newspaper. Avoid naps. Consider a visit to the dentist to discuss a mouthpiece for your sleep apnea. Some people who cannot tolerate CPAP find these helpful. The app we discussed: Breathe2Relax. I will plan to see you again next week on May 13 at 9:30 am  -Delfin [facility]oniann Failare

## 2018-01-03 NOTE — Assessment & Plan Note (Signed)
Assessment / Plan / Recommendations: (See note of 01/03/18 )Haley Mclaughlin will practice diaphragmatic breathing and progressive muscle relaxation each day, preferably two times. We also discussed sleep hygiene techniques, as she is struggling with getting to sleep and middle insomnia. She has sleep apnea, but cannot tolerate her CPAP machine due to symptoms of claustrophobia. Although Dr. Deirdre Priest prescribed Crestor and Prozac, she is not taking these medications due to her fear of medication. We practiced an interoceptive exposure exercise in which she spun in the chair several times while shaking her head, to induce dizziness. We then talked through this feeling of dizziness and how it would pass quickly and also discussed how diaphragmatic breathing can make this process even faster. We discussed how medication may cause her to feel side effects, such as dizziness, but they are usually not cause for concern. She will practice DB and PMR along with sleep hygiene techniques over the next week. She will return on May 13 at 9:30 am. We will practice mindfulness and meditation at that time.

## 2018-01-03 NOTE — Progress Notes (Signed)
Dr. Deirdre Priest requested a Behavioral Health Consult, which I scheduled for today.   Presenting Issue:  The patient is having problems with anxiety and insomnia.  Report of symptoms:  Panic attacks, difficulty with concentration, poor short-term memory.  Duration of CURRENT symptoms:  20 years   Age of onset of first mood disturbance:  Since around age 52  Impact on function:  Poor sleep, middle insomnia, panic attacks, difficulty concentrating, poor short-term memory.  Psychiatric History - Diagnoses: anxiety, panic attacks - Hospitalizations: brief visits to ER for panic attacks - Pharmacotherapy: Has tried Paxil, Xanax.  - Outpatient therapy: did receive outpatient therapy for panic attacks; learned techniques, such as running hands under cold water to bring down heart rate.  Family history of psychiatric issues:  Mother has depression and one suicide attempt; her nephew has Bipolar Disorder and has been hospitalized.  Current and history of substance use:  None -- she does not like taking medication, so "Why would I do that?"  Medical conditions that might explain or contribute to symptoms:  High cholesterol, pre-diabetes; stressed about taking medication; sleep apnea; transient ischemic attack per patient.  PHQ-9:  13 (moderate, no SI, somewhat difficult) GAD-7:  18 (severe, somewhat difficult) MDQ (if indicated):  Negative, but did endorse 6 symptoms so this should be monitored: irritability, less sleep, more talkative, racing thoughts, distractible, increased interest in sex.  Assessment / Plan / Recommendations: Haley Mclaughlin will practice diaphragmatic breathing and progressive muscle relaxation each day, preferably two times. We also discussed sleep hygiene techniques, as she is struggling with getting to sleep and middle insomnia. She has sleep apnea, but cannot tolerate her CPAP machine due to symptoms of claustrophobia. Although Dr. Deirdre Priest prescribed Crestor and Prozac, she is  not taking these medications due to her fear of medication. We practiced an interoceptive exposure exercise in which she spun in the chair several times while shaking her head, to induce dizziness. We then talked through this feeling of dizziness and how it would pass quickly and also discussed how diaphragmatic breathing can make this process even faster. We discussed how medication may cause her to feel side effects, such as dizziness, but they are usually not cause for concern. She will practice DB and PMR along with sleep hygiene techniques over the next week. She will return on May 13 at 9:30 am. We will practice mindfulness and meditation at that time.   Warmhandoff/office visit completed.

## 2018-01-05 ENCOUNTER — Encounter: Payer: Self-pay | Admitting: Family Medicine

## 2018-01-08 ENCOUNTER — Encounter: Payer: Self-pay | Admitting: Family Medicine

## 2018-01-10 ENCOUNTER — Ambulatory Visit: Payer: Medicaid Other | Admitting: Psychology

## 2018-01-10 DIAGNOSIS — F410 Panic disorder [episodic paroxysmal anxiety] without agoraphobia: Secondary | ICD-10-CM

## 2018-01-10 NOTE — Patient Instructions (Addendum)
Dabney -- it was great to see you today. As we discussed, please complete your thought record regarding events that trigger anxiety. Try to generate alternative thoughts and use your relaxation strategies (deep breathing). Return to see me at 9:30 am on June 3 at 9:30 am  Take care and be well!  -Delfin [facility]

## 2018-01-10 NOTE — Assessment & Plan Note (Signed)
Assessment/Plan/Recommendation: The patient is experiencing severe anxiety (GAD7 = 21, somewhat difficult) and moderately severe depression (PHQ9 = 15, no SI, somewhat difficult). She is troubled by anxiety regarding her own health and her mother's health. In addition, she is debilitated by social anxiety. I provided some psychoeducation around these topics and suggested the use of a thought record to address the patient's anxious cognitions (e.g., "if my mom does not pick up the phone right away, it means that she is dead!"). She is finding the diaphragmatic breathing to be helpful. We discussed using a social anxiety exposure hierarchy in the future.

## 2018-01-10 NOTE — Progress Notes (Signed)
Reason for follow-up:  Ramatoulaye returns to discuss her symptoms of anxiety.  Issues discussed:  Thekla is experiencing lots of anxiety about her mother's safety, her own health, and social anxiety as well.

## 2018-01-20 ENCOUNTER — Encounter: Payer: Self-pay | Admitting: Family Medicine

## 2018-01-21 MED ORDER — HYDROCORTISONE 2.5 % EX CREA: TOPICAL_CREAM | Freq: Two times a day (BID) | CUTANEOUS | 2 refills | Status: DC

## 2018-01-21 MED ORDER — ATORVASTATIN CALCIUM 80 MG PO TABS: 80.0000 mg | ORAL_TABLET | Freq: Every day | ORAL | 3 refills | Status: DC

## 2018-01-31 ENCOUNTER — Ambulatory Visit: Payer: Medicaid Other | Admitting: Psychology

## 2018-01-31 DIAGNOSIS — F410 Panic disorder [episodic paroxysmal anxiety] without agoraphobia: Secondary | ICD-10-CM

## 2018-01-31 NOTE — Progress Notes (Signed)
Reason for follow-up:  [name] returns for help with managing her symptoms of anxiety.  Issues discussed:  The patient did an excellent job of completing her homework consisting of a thought record around anxiety provoking events. She was able to very effectively reframe her anxious thoughts.

## 2018-02-01 NOTE — Assessment & Plan Note (Signed)
Assessment/Plan/Recommendations: Haley Mclaughlin has experienced a significant drop in her symptoms of anxiety *GAD7 today = 8, mild, down from 21 (severe), last visit) and depression (PHQ9 today = 8, mild, down from 15 (moderately severe) last visit; no SI, somewhat difficult), so this is a very positive development. She reported that her anxiety thought record was a very useful tool for her. She was even able to take her cholesterol medication and get through a panic attack by practicing her diaphragmatic breathing. After this, she was able to take her medicine without issue. Haley Mclaughlin continues to express some symptoms of social anxiety so we discussed creating a hierarch of items upon which to work. She will start with something small, such as going to church. The patient will gradually work up to more difficult items, such as large crowds and situations where she is "the center of attention" (e.g., people singing "happy birthday" to her in a restaurant). Haley Mclaughlin will return on June 17 at 9:30 am.

## 2018-02-03 ENCOUNTER — Ambulatory Visit: Payer: Medicaid Other | Admitting: Family Medicine

## 2018-02-10 ENCOUNTER — Ambulatory Visit: Payer: Medicaid Other | Admitting: Family Medicine

## 2018-02-14 ENCOUNTER — Ambulatory Visit: Payer: Medicaid Other

## 2018-03-07 ENCOUNTER — Other Ambulatory Visit: Payer: Self-pay

## 2018-03-08 MED ORDER — OMEPRAZOLE 40 MG PO CPDR: 40.0000 mg | DELAYED_RELEASE_CAPSULE | Freq: Every day | ORAL | 2 refills | Status: DC | PRN

## 2018-03-22 ENCOUNTER — Encounter: Payer: Self-pay | Admitting: Emergency Medicine

## 2018-03-22 ENCOUNTER — Other Ambulatory Visit: Payer: Self-pay

## 2018-03-22 ENCOUNTER — Emergency Department
Admission: EM | Admit: 2018-03-22 | Discharge: 2018-03-22 | Disposition: A | Discharge status: Home / Self Care | Payer: Medicaid Other | Attending: Emergency Medicine | Admitting: Emergency Medicine

## 2018-03-22 DIAGNOSIS — M79601 Pain in right arm: Secondary | ICD-10-CM | POA: Diagnosis not present

## 2018-03-22 DIAGNOSIS — Z7982 Long term (current) use of aspirin: Secondary | ICD-10-CM | POA: Insufficient documentation

## 2018-03-22 DIAGNOSIS — M79602 Pain in left arm: Secondary | ICD-10-CM | POA: Insufficient documentation

## 2018-03-22 DIAGNOSIS — I10 Essential (primary) hypertension: Secondary | ICD-10-CM | POA: Insufficient documentation

## 2018-03-22 DIAGNOSIS — M79605 Pain in left leg: Secondary | ICD-10-CM | POA: Insufficient documentation

## 2018-03-22 DIAGNOSIS — M79604 Pain in right leg: Secondary | ICD-10-CM | POA: Diagnosis not present

## 2018-03-22 DIAGNOSIS — Z79899 Other long term (current) drug therapy: Secondary | ICD-10-CM | POA: Diagnosis not present

## 2018-03-22 DIAGNOSIS — M79609 Pain in unspecified limb: Secondary | ICD-10-CM

## 2018-03-22 DIAGNOSIS — R531 Weakness: Secondary | ICD-10-CM | POA: Diagnosis present

## 2018-03-22 LAB — CBC
HEMATOCRIT: 39.7 % (ref 35.0–47.0)
HEMOGLOBIN: 13.8 g/dL (ref 12.0–16.0)
MCH: 30.9 pg (ref 26.0–34.0)
MCHC: 34.7 g/dL (ref 32.0–36.0)
MCV: 88.9 fL (ref 80.0–100.0)
Platelets: 221 10*3/uL (ref 150–440)
RBC: 4.46 MIL/uL (ref 3.80–5.20)
RDW: 13.1 % (ref 11.5–14.5)
WBC: 9 10*3/uL (ref 3.6–11.0)

## 2018-03-22 LAB — URINALYSIS, COMPLETE (UACMP) WITH MICROSCOPIC
BACTERIA UA: NONE SEEN
BILIRUBIN URINE: NEGATIVE
GLUCOSE, UA: NEGATIVE mg/dL
Ketones, ur: NEGATIVE mg/dL
LEUKOCYTES UA: NEGATIVE
NITRITE: NEGATIVE
PROTEIN: NEGATIVE mg/dL
RBC / HPF: >50 RBC/hpf — ABNORMAL HIGH (ref 0–5)
SPECIFIC GRAVITY, URINE: 1.017 (ref 1.005–1.030)
pH: 5 (ref 5.0–8.0)

## 2018-03-22 LAB — BASIC METABOLIC PANEL
ANION GAP: 7 (ref 5–15)
BUN: 9 mg/dL (ref 6–20)
CO2: 27 mmol/L (ref 22–32)
Calcium: 9 mg/dL (ref 8.9–10.3)
Chloride: 106 mmol/L (ref 98–111)
Creatinine, Ser: 0.75 mg/dL (ref 0.44–1.00)
GFR calc non Af Amer: >60 mL/min (ref >60)
Glucose, Bld: 146 mg/dL — ABNORMAL HIGH (ref 70–99)
POTASSIUM: 4.2 mmol/L (ref 3.5–5.1)
SODIUM: 140 mmol/L (ref 135–145)

## 2018-03-22 MED ORDER — KETOROLAC TROMETHAMINE 30 MG/ML IJ SOLN
15.0000 mg | Freq: Once | INTRAMUSCULAR | Status: DC
  Filled 2018-03-22: qty 1

## 2018-03-22 MED ORDER — SODIUM CHLORIDE 0.9 % IV BOLUS: 1000.0000 mL | Freq: Once | INTRAVENOUS | Status: DC

## 2018-03-22 MED ORDER — IBUPROFEN 600 MG PO TABS
600.0000 mg | ORAL_TABLET | Freq: Once | ORAL | Status: AC
Start: 2018-03-22 — End: 2018-03-22
  Administered 2018-03-22: 600 mg via ORAL
  Filled 2018-03-22: qty 1

## 2018-03-22 MED ORDER — ACETAMINOPHEN 500 MG PO TABS: 1000.0000 mg | ORAL_TABLET | Freq: Once | ORAL | Status: DC

## 2018-03-22 NOTE — ED Provider Notes (Signed)
[facility] Emergency Department Provider Note  ____________________________________________  Time seen: Approximately 3:28 PM  I have reviewed the triage vital signs and the nursing notes.   HISTORY  Chief Complaint Weakness and Leg Pain   HPI Haley Mclaughlin is a 52 y.o. female with a history of hypertension, hyperlipidemia, diet-controlled diabetes, anxiety who presents for evaluation of generalized weakness.  Patient reports that 3 days ago she started having generalized weakness.  She has been spending most of the day in bed.  For the last 2 days she has had pain in both legs and arms. Legs worse than arms.  She describes the pain as an ache, constant, nonradiating, not worse with palpation or weightbearing/movement of the arms.  She reports that she feels like she has the flu but no fever, no chills, no cough, no shortness of breath, no chest pain, no abdominal pain, no nausea, vomiting, diarrhea, dysuria, or back pain.  Patient is currently on 3rd day of her menstrual period which coincided with the onset of her symptoms.   Past Medical History:  Diagnosis Date  . Abnormal Pap smear   . Gestational diabetes    diet controlled with #3  . Hyperlipemia   . Hyperlipidemia   . Hypertension   . PID (pelvic inflammatory disease)   . Preterm labor    with last 4 preg, del at term  . Sarcoidosis   . Urinary tract infection     Patient Active Problem List   Diagnosis Date Noted  . Screen for colon cancer 12/21/2017  . Insomnia 12/21/2017  . Chest discomfort 12/21/2017  . Low blood sugar 05/27/2017  . Premenstrual syndrome 02/17/2017  . Elevated blood pressure reading 08/21/2016  . Hyperglycemia 06/05/2013  . 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 HYPERCHOLESTEROLEMIA 10/28/2006  . PANIC ATTACKS 10/28/2006  . PALPITATIONS 10/28/2006    Past Surgical History:  Procedure Laterality Date  . APPENDECTOMY    . INDUCED ABORTION      Prior to  Admission medications   Medication Sig Start Date End Date Taking? Authorizing Provider  acetaminophen (TYLENOL) 500 MG tablet Take 1,000 mg by mouth every 6 (six) hours as needed for moderate pain.    [provider]  aspirin 81 MG tablet Take 81 mg by mouth daily.    [provider]  atorvastatin (LIPITOR) 80 MG tablet Take 1 tablet (80 mg total) by mouth daily. 01/21/18   Carney Living, MD  FLUoxetine (PROZAC) 20 MG capsule Take one starting 1-2 days before mood changes start with your menstrual periods and take through the menstrual period 02/19/17   Carney Living, MD  fluticasone (FLONASE) 50 MCG/ACT nasal spray Place 1 spray into both nostrils daily as needed.     [provider]  hydrocortisone 2.5 % cream Apply topically 2 (two) times daily. 01/21/18   Carney Living, MD  ibuprofen (ADVIL,MOTRIN) 600 MG tablet Take 1 tablet (600 mg total) by mouth every 6 (six) hours as needed. 12/06/17   Dionne Bucy, MD  metoprolol tartrate (LOPRESSOR) 25 MG tablet Take 0.5 tablets (12.5 mg total) by mouth 2 (two) times daily. Patient not taking: Reported on 12/21/2017 01/21/17   McKeag, Janine Ores, MD  omeprazole (PRILOSEC) 40 MG capsule Take 1 capsule (40 mg total) by mouth daily as needed. 03/08/18   Carney Living, MD  triamcinolone ointment (KENALOG) 0.5 % Apply 1 application topically 2 (two) times daily. 05/27/17   Marquette Saa, MD  Allergies Sulfamethoxazole-trimethoprim  Family History  Problem Relation Age of Onset  . Heart disease Mother   . Hypertension Mother   . Diabetes Father   . Heart disease Father   . Anesthesia problems Neg Hx     Social History Social History   Tobacco Use  . Smoking status: Never Smoker  . Smokeless tobacco: Never Used  Substance Use Topics  . Alcohol use: No  . Drug use: No    Review of Systems  Constitutional: Negative for fever. + generalized weakness Eyes: Negative for visual  changes. ENT: Negative for sore throat. Neck: No neck pain  Cardiovascular: Negative for chest pain. Respiratory: Negative for shortness of breath. Gastrointestinal: Negative for abdominal pain, vomiting or diarrhea. Genitourinary: Negative for dysuria. Musculoskeletal: Negative for back pain. + b/l arms and legs pain Skin: Negative for rash. Neurological: Negative for headaches, weakness or numbness. Psych: No SI or HI  ____________________________________________   PHYSICAL EXAM:  VITAL SIGNS: ED Triage Vitals  Enc Vitals Group     BP 03/22/18 1154 (!) 178/88     Pulse Rate 03/22/18 1154 87     Resp 03/22/18 1154 16     Temp 03/22/18 1154 98.9 F (37.2 C)     Temp Source 03/22/18 1154 Oral     SpO2 03/22/18 1154 98 %     Weight 03/22/18 1155 210 lb (95.3 kg)     Height 03/22/18 1155 5\' 4"  (1.626 m)     Head Circumference --      Peak Flow --      Pain Score 03/22/18 1155 7     Pain Loc --      Pain Edu? --      Excl. in [place]? --     Constitutional: Alert and oriented. Well appearing and in no apparent distress. HEENT:      Head: Normocephalic and atraumatic.         Eyes: Conjunctivae are normal. Sclera is non-icteric.       Mouth/Throat: Mucous membranes are moist.       Neck: Supple with no signs of meningismus. Cardiovascular: Regular rate and rhythm. No murmurs, gallops, or rubs. 2+ symmetrical distal pulses are present in all extremities. No JVD. Respiratory: Normal respiratory effort. Lungs are clear to auscultation bilaterally. No wheezes, crackles, or rhonchi.  Gastrointestinal: Soft, non tender, and non distended with positive bowel sounds. No rebound or guarding. Musculoskeletal: Nontender with normal range of motion in all extremities. No edema, cyanosis, or erythema of extremities. 2+ distal pulses x 4, extremities are warm and well perfused.  Neurologic: Normal speech and language. Face is symmetric. Moving all extremities. Intact strength and sensation x  4. 2+ DTRs. No gross focal neurologic deficits are appreciated. Skin: Skin is warm, dry and intact. No rash noted. Psychiatric: Mood and affect are normal. Speech and behavior are normal.  ____________________________________________   LABS (all labs ordered are listed, but only abnormal results are displayed)  Labs Reviewed  BASIC METABOLIC PANEL - Abnormal; Notable for the following components:      Result Value   Glucose, Bld 146 (*)    All other components within normal limits  URINALYSIS, COMPLETE (UACMP) WITH MICROSCOPIC - Abnormal; Notable for the following components:   Color, Urine YELLOW (*)    APPearance HAZY (*)    Hgb urine dipstick LARGE (*)    RBC / HPF >50 (*)    All other components within normal limits  CBC  CBG MONITORING, ED  ____________________________________________  EKG  ED ECG REPORT I, Nita Sickle, the attending physician, personally viewed and interpreted this ECG.  Normal sinus rhythm, rate of 80, normal intervals, normal axis, no ST elevations or depressions.  Normal EKG.  Unchanged from prior ____________________________________________  RADIOLOGY  none ____________________________________________   PROCEDURES  Procedure(s) performed: None Procedures Critical Care performed:  None ____________________________________________   INITIAL IMPRESSION / ASSESSMENT AND PLAN / ED COURSE  52 y.o. female with a history of hypertension, hyperlipidemia, diet-controlled diabetes, anxiety who presents for evaluation of generalized weakness and dull aching pain on b/l arms and legs x 3 days. Symptoms coincided with onset of menses.  Patient is extremely well-appearing, no distress, she is slightly hypertensive but other vital signs are within normal limits, she is neurologically intact, extremities are warm and well-perfused with no swelling, erythema, or any injuries.  Strong distal pulses in all 4 extremities.  No acute findings on physical  exam.  EKG showed no evidence of ischemia or dysrhythmias.  Labs including CBC, BMP, and UA with no acute fingins other than mild hyperglycemia and hematuria (patient currently on menses). Ddx viral process vs weakness from menses. No signs of DVT, limb ischemia, cauda equina, stroke. Will give IVF and toradol. Anticipate discharge    _________________________ 3:48 PM on 03/22/2018 -----------------------------------------  Patient does not wish an IV or IVF she just wanted to make sure everything was ok. Will give dose of PO motrin and DC home with f/u with PCP.  Gust return precautions for abdominal pain, back pain, strokelike symptoms, fever, or any other symptoms concerning to patient.    As part of my medical decision making, I reviewed the following data within the electronic MEDICAL RECORD NUMBER Nursing notes reviewed and incorporated, Labs reviewed , EKG interpreted , Old EKG reviewed, Old chart reviewed, Notes from prior ED visits and [place] Controlled Substance Database    Pertinent labs & imaging results that were available during my care of the patient were reviewed by me and considered in my medical decision making (see chart for details).    ____________________________________________   FINAL CLINICAL IMPRESSION(S) / ED DIAGNOSES  Final diagnoses:  Generalized weakness  Pain of multiple extremities      NEW MEDICATIONS STARTED DURING THIS VISIT:  ED Discharge Orders    None       Note:  This document was prepared using Dragon voice recognition software and may include unintentional dictation errors.    Don Perking, [place], MD 03/22/18 [phone]

## 2018-03-22 NOTE — ED Notes (Signed)
Pt states that she has been feeling weak and been having increasing pain for the last 2-3 days. Pt states that she was just worried that she had a blood clot.

## 2018-03-22 NOTE — ED Triage Notes (Signed)
Patient reports feeling weak x2-3 days. Reports starting having pain in both legs starting last night, worsening this morning. Patient denies any recent illness or injury. Ambulatory to triage with steady gait.

## 2018-07-26 ENCOUNTER — Telehealth: Payer: Self-pay | Admitting: Family Medicine

## 2018-07-26 NOTE — Telephone Encounter (Signed)
Spoke with pt and discussed their options for their overdue COL. Pt refused COL, but said yes to Cologuard. -CH

## 2018-08-02 LAB — COLOGUARD: Cologuard: NEGATIVE

## 2018-08-30 ENCOUNTER — Encounter: Payer: Self-pay | Admitting: Family Medicine

## 2018-09-05 ENCOUNTER — Encounter: Payer: Self-pay | Admitting: Family Medicine

## 2018-09-08 ENCOUNTER — Other Ambulatory Visit: Payer: Self-pay | Admitting: *Deleted

## 2018-09-08 DIAGNOSIS — Z1212 Encounter for screening for malignant neoplasm of rectum: Principal | ICD-10-CM

## 2018-09-08 DIAGNOSIS — Z1211 Encounter for screening for malignant neoplasm of colon: Secondary | ICD-10-CM

## 2018-09-09 ENCOUNTER — Encounter: Payer: Self-pay | Admitting: Family Medicine

## 2018-09-27 ENCOUNTER — Other Ambulatory Visit: Payer: Self-pay | Admitting: Family Medicine

## 2018-09-27 DIAGNOSIS — Z1231 Encounter for screening mammogram for malignant neoplasm of breast: Secondary | ICD-10-CM

## 2018-11-11 ENCOUNTER — Ambulatory Visit: Payer: Medicaid Other

## 2019-04-14 ENCOUNTER — Ambulatory Visit
Admission: RE | Admit: 2019-04-14 | Discharge: 2019-04-14 | Disposition: A | Discharge status: Home / Self Care | Payer: Medicaid Other | Source: Ambulatory Visit | Attending: Family Medicine | Admitting: Family Medicine

## 2019-04-14 ENCOUNTER — Other Ambulatory Visit: Payer: Self-pay

## 2019-04-14 DIAGNOSIS — Z1231 Encounter for screening mammogram for malignant neoplasm of breast: Secondary | ICD-10-CM

## 2019-04-15 ENCOUNTER — Encounter: Payer: Self-pay | Admitting: Family Medicine

## 2019-05-04 ENCOUNTER — Encounter: Payer: Self-pay | Admitting: Family Medicine

## 2019-05-14 ENCOUNTER — Encounter: Payer: Self-pay | Admitting: Family Medicine

## 2019-05-15 ENCOUNTER — Encounter: Payer: Self-pay | Admitting: Family Medicine

## 2019-05-28 ENCOUNTER — Other Ambulatory Visit: Payer: Self-pay

## 2019-05-28 ENCOUNTER — Emergency Department: Payer: Medicaid Other

## 2019-05-28 ENCOUNTER — Emergency Department
Admission: EM | Admit: 2019-05-28 | Discharge: 2019-05-28 | Disposition: A | Discharge status: Home / Self Care | Payer: Medicaid Other | Attending: Emergency Medicine | Admitting: Emergency Medicine

## 2019-05-28 DIAGNOSIS — S93601A Unspecified sprain of right foot, initial encounter: Secondary | ICD-10-CM

## 2019-05-28 DIAGNOSIS — Y939 Activity, unspecified: Secondary | ICD-10-CM | POA: Diagnosis not present

## 2019-05-28 DIAGNOSIS — Y929 Unspecified place or not applicable: Secondary | ICD-10-CM | POA: Diagnosis not present

## 2019-05-28 DIAGNOSIS — Y999 Unspecified external cause status: Secondary | ICD-10-CM | POA: Insufficient documentation

## 2019-05-28 DIAGNOSIS — I10 Essential (primary) hypertension: Secondary | ICD-10-CM | POA: Insufficient documentation

## 2019-05-28 DIAGNOSIS — Z79899 Other long term (current) drug therapy: Secondary | ICD-10-CM | POA: Diagnosis not present

## 2019-05-28 DIAGNOSIS — X501XXA Overexertion from prolonged static or awkward postures, initial encounter: Secondary | ICD-10-CM | POA: Diagnosis not present

## 2019-05-28 DIAGNOSIS — Z7982 Long term (current) use of aspirin: Secondary | ICD-10-CM | POA: Diagnosis not present

## 2019-05-28 DIAGNOSIS — S99921A Unspecified injury of right foot, initial encounter: Secondary | ICD-10-CM | POA: Diagnosis present

## 2019-05-28 MED ORDER — TRAMADOL HCL 50 MG PO TABS: 50.0000 mg | ORAL_TABLET | Freq: Four times a day (QID) | ORAL | 0 refills | Status: DC | PRN

## 2019-05-28 MED ORDER — MELOXICAM 15 MG PO TABS: 15.0000 mg | ORAL_TABLET | Freq: Every day | ORAL | 0 refills | Status: DC

## 2019-05-28 NOTE — ED Provider Notes (Signed)
[facility] Emergency Department Provider Note ____________________________________________  Time seen: Approximately 3:39 PM  I have reviewed the triage vital signs and the nursing notes.   HISTORY  Chief Complaint Foot Pain    HPI Haley Mclaughlin is a 53 y.o. female who presents to the emergency department for evaluation and treatment of right foot pain after inversion injury yesterday. Pain is on the top of the foot over the 5th metatarsal. She describes it as a burning and tearing pain with movement of the toes. Some relief with ibuprofen. No previous injury.  Past Medical History:  Diagnosis Date  . Abnormal Pap smear   . Gestational diabetes    diet controlled with #3  . Hyperlipemia   . Hyperlipidemia   . Hypertension   . PID (pelvic inflammatory disease)   . Preterm labor    with last 4 preg, del at term  . Sarcoidosis   . Urinary tract infection     Patient Active Problem List   Diagnosis Date Noted  . Screen for colon cancer 12/21/2017  . Insomnia 12/21/2017  . Chest discomfort 12/21/2017  . Low blood sugar 05/27/2017  . Premenstrual syndrome 02/17/2017  . Elevated blood pressure reading 08/21/2016  . Hyperglycemia 06/05/2013  . 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 HYPERCHOLESTEROLEMIA 10/28/2006  . PANIC ATTACKS 10/28/2006  . PALPITATIONS 10/28/2006    Past Surgical History:  Procedure Laterality Date  . APPENDECTOMY    . INDUCED ABORTION      Prior to Admission medications   Medication Sig Start Date End Date Taking? Authorizing Provider  acetaminophen (TYLENOL) 500 MG tablet Take 1,000 mg by mouth every 6 (six) hours as needed for moderate pain.    [provider]  aspirin 81 MG tablet Take 81 mg by mouth daily.    [provider]  atorvastatin (LIPITOR) 80 MG tablet Take 1 tablet (80 mg total) by mouth daily. 01/21/18   Carney Livinghambliss, Marshall L, MD  FLUoxetine (PROZAC) 20 MG capsule Take one starting 1-2  days before mood changes start with your menstrual periods and take through the menstrual period 02/19/17   Carney Livinghambliss, Marshall L, MD  fluticasone (FLONASE) 50 MCG/ACT nasal spray Place 1 spray into both nostrils daily as needed.     [provider]  hydrocortisone 2.5 % cream Apply topically 2 (two) times daily. 01/21/18   Carney Livinghambliss, Marshall L, MD  ibuprofen (ADVIL,MOTRIN) 600 MG tablet Take 1 tablet (600 mg total) by mouth every 6 (six) hours as needed. 12/06/17   Dionne BucySiadecki, Sebastian, MD  meloxicam (MOBIC) 15 MG tablet Take 1 tablet (15 mg total) by mouth daily. 05/28/19   Evelen Vazguez, Rulon Eisenmengerari B, FNP  metoprolol tartrate (LOPRESSOR) 25 MG tablet Take 0.5 tablets (12.5 mg total) by mouth 2 (two) times daily. Patient not taking: Reported on 12/21/2017 01/21/17   McKeag, Janine OresIan D, MD  omeprazole (PRILOSEC) 40 MG capsule Take 1 capsule (40 mg total) by mouth daily as needed. 03/08/18   Carney Livinghambliss, Marshall L, MD  traMADol (ULTRAM) 50 MG tablet Take 1 tablet (50 mg total) by mouth every 6 (six) hours as needed. 05/28/19   Alayiah Fontes, Rulon Eisenmengerari B, FNP  triamcinolone ointment (KENALOG) 0.5 % Apply 1 application topically 2 (two) times daily. 05/27/17   Marquette SaaLancaster, Abigail Joseph, MD    Allergies Sulfamethoxazole-trimethoprim  Family History  Problem Relation Age of Onset  . Heart disease Mother   . Hypertension Mother   . Diabetes Father   . Heart disease Father   .  Anesthesia problems Neg Hx     Social History Social History   Tobacco Use  . Smoking status: Never Smoker  . Smokeless tobacco: Never Used  Substance Use Topics  . Alcohol use: No  . Drug use: No    Review of Systems Constitutional: Negative for fever. Cardiovascular: Negative for chest pain. Respiratory: Negative for shortness of breath. Musculoskeletal: Positive for right foot pain. Skin: negative for open wound or lesion.  Neurological: Negative for decrease in sensation  ____________________________________________   PHYSICAL  EXAM:  VITAL SIGNS: ED Triage Vitals  Enc Vitals Group     BP 05/28/19 1340 (!) 147/89     Pulse Rate 05/28/19 1340 89     Resp 05/28/19 1340 16     Temp 05/28/19 1340 98.6 F (37 C)     Temp Source 05/28/19 1340 Oral     SpO2 05/28/19 1340 98 %     Weight --      Height --      Head Circumference --      Peak Flow --      Pain Score 05/28/19 1409 5     Pain Loc --      Pain Edu? --      Excl. in [place]? --     Constitutional: Alert and oriented. Well appearing and in no acute distress. Eyes: Conjunctivae are clear without discharge or drainage Head: Atraumatic Neck: Supple Respiratory: No cough. Respirations are even and unlabored. Musculoskeletal: Pain over the dorsal aspect of the right foot in the area overlying the 5th metatarsal. Neurologic: motor and sensory function of the right foot and toes intact.  Skin: no open wounds, abrasions, or lesions overlying the right foot.   Psychiatric: Affect and behavior are appropriate.  ____________________________________________   LABS (all labs ordered are listed, but only abnormal results are displayed)  Labs Reviewed - No data to display ____________________________________________  RADIOLOGY  Image of the right foot is negative for acute bony abnormality.  Image was viewed by me.  No abnormality according to radiology report. ____________________________________________   PROCEDURES  Procedures  ____________________________________________   INITIAL IMPRESSION / ASSESSMENT AND PLAN / ED COURSE  Haley Mclaughlin is a 53 y.o. who presents to the emergency department for treatment and evaluation of right foot pain after an inversion injury yesterday.  X-ray is negative for any acute bony abnormality.  The patient will be wrapped in an Ace wrap and then placed in a postop shoe.  She was encouraged to rest, ice, and elevate the foot over the next few days.  She will be given a prescription for meloxicam and tramadol.  She  was advised to follow-up with podiatry if not improving over the next week or so.  She was instructed to return to the emergency department for symptoms of concern if unable to see primary care or podiatry.  Medications - No data to display  Pertinent labs & imaging results that were available during my care of the patient were reviewed by me and considered in my medical decision making (see chart for details).  _________________________________________   FINAL CLINICAL IMPRESSION(S) / ED DIAGNOSES  Final diagnoses:  Sprain of right foot, initial encounter    ED Discharge Orders         Ordered    meloxicam (MOBIC) 15 MG tablet  Daily     05/28/19 1555    traMADol (ULTRAM) 50 MG tablet  Every 6 hours PRN     05/28/19 1555  If controlled substance prescribed during this visit, 12 month history viewed on the [facility] prior to issuing an initial prescription for Schedule II or III opiod.   Chinita Pester, FNP 05/28/19 1619    Phineas Semen, MD 05/28/19 [phone]

## 2019-05-28 NOTE — ED Notes (Signed)
No swelling noted to anterior/lateral aspect of R foot where pt c/o continued pain since rolling foot yesterday. No change in color/WDL. Pulses equal bilaterally. Pt has taken ibuprofen. Pt can move toes and foot. States hurts worse when she moves her toes.

## 2019-05-28 NOTE — ED Notes (Signed)
First Nurse Note: Pt ambulatory into ED without difficulty c/o right foot pain. Pt is in NAD.

## 2019-05-28 NOTE — ED Triage Notes (Signed)
Pt presents via POV c/o right foot pain s/p rolling ankle yesterday.

## 2019-06-05 ENCOUNTER — Encounter: Payer: Self-pay | Admitting: Family Medicine

## 2019-06-05 DIAGNOSIS — M79673 Pain in unspecified foot: Secondary | ICD-10-CM

## 2019-09-09 ENCOUNTER — Telehealth: Payer: Self-pay | Admitting: Family Medicine

## 2019-09-09 NOTE — Telephone Encounter (Signed)
Her daughter tested positive today.  Was neg yesterday works in a NH with + covid  Recommend anyone in the family from great grandmom to 6 mo is likely infectious and should quarantine.    If anyone becomes very shortness of breath they should go to the ER  Around [place] should always wear masks and wash well  They agree

## 2019-09-10 ENCOUNTER — Encounter: Payer: Self-pay | Admitting: Family Medicine

## 2019-09-14 ENCOUNTER — Encounter: Payer: Self-pay | Admitting: Family Medicine

## 2019-09-18 ENCOUNTER — Encounter: Payer: Self-pay | Admitting: Family Medicine

## 2019-09-20 MED ORDER — BENZONATATE 100 MG PO CAPS: 100.0000 mg | ORAL_CAPSULE | Freq: Two times a day (BID) | ORAL | 1 refills | Status: DC | PRN

## 2019-09-28 ENCOUNTER — Encounter: Payer: Self-pay | Admitting: Family Medicine

## 2019-10-09 ENCOUNTER — Encounter: Payer: Self-pay | Admitting: Family Medicine

## 2019-10-10 ENCOUNTER — Encounter: Payer: Self-pay | Admitting: Family Medicine

## 2019-11-01 ENCOUNTER — Encounter: Payer: Self-pay | Admitting: Family Medicine

## 2019-11-01 MED ORDER — HYDROCORTISONE 2.5 % EX CREA: TOPICAL_CREAM | Freq: Two times a day (BID) | CUTANEOUS | 2 refills | Status: DC

## 2019-11-02 ENCOUNTER — Other Ambulatory Visit: Payer: Self-pay | Admitting: Family Medicine

## 2020-02-20 ENCOUNTER — Encounter: Payer: Self-pay | Admitting: Family Medicine

## 2020-03-05 ENCOUNTER — Encounter: Payer: Self-pay | Admitting: Family Medicine

## 2020-03-14 ENCOUNTER — Other Ambulatory Visit: Payer: Self-pay | Admitting: Family Medicine

## 2020-03-14 DIAGNOSIS — Z1231 Encounter for screening mammogram for malignant neoplasm of breast: Secondary | ICD-10-CM

## 2020-04-15 ENCOUNTER — Ambulatory Visit
Admission: RE | Admit: 2020-04-15 | Discharge: 2020-04-15 | Disposition: A | Discharge status: Home / Self Care | Payer: Medicaid Other | Source: Ambulatory Visit | Attending: Family Medicine | Admitting: Family Medicine

## 2020-04-15 ENCOUNTER — Other Ambulatory Visit: Payer: Self-pay

## 2020-04-15 DIAGNOSIS — Z1231 Encounter for screening mammogram for malignant neoplasm of breast: Secondary | ICD-10-CM

## 2020-04-25 ENCOUNTER — Encounter: Payer: Self-pay | Admitting: Family Medicine

## 2020-07-09 ENCOUNTER — Other Ambulatory Visit: Payer: Self-pay

## 2020-07-09 ENCOUNTER — Encounter: Payer: Self-pay | Admitting: Family Medicine

## 2020-07-09 ENCOUNTER — Ambulatory Visit: Payer: Medicaid Other | Admitting: Family Medicine

## 2020-07-09 VITALS — BP 140/70 | HR 73 | Ht 64.0 in | Wt 204.6 lb

## 2020-07-09 DIAGNOSIS — Z1159 Encounter for screening for other viral diseases: Secondary | ICD-10-CM | POA: Diagnosis not present

## 2020-07-09 DIAGNOSIS — R002 Palpitations: Secondary | ICD-10-CM

## 2020-07-09 DIAGNOSIS — R030 Elevated blood-pressure reading, without diagnosis of hypertension: Secondary | ICD-10-CM | POA: Diagnosis not present

## 2020-07-09 DIAGNOSIS — E7800 Pure hypercholesterolemia, unspecified: Secondary | ICD-10-CM | POA: Diagnosis not present

## 2020-07-09 DIAGNOSIS — R739 Hyperglycemia, unspecified: Secondary | ICD-10-CM | POA: Diagnosis not present

## 2020-07-09 DIAGNOSIS — Z114 Encounter for screening for human immunodeficiency virus [HIV]: Secondary | ICD-10-CM

## 2020-07-09 DIAGNOSIS — Z5320 Procedure and treatment not carried out because of patient's decision for unspecified reasons: Secondary | ICD-10-CM

## 2020-07-09 LAB — POCT GLYCOSYLATED HEMOGLOBIN (HGB A1C): HbA1c, POC (controlled diabetic range): 6.2 % (ref 0.0–7.0)

## 2020-07-09 NOTE — Patient Instructions (Addendum)
Good to see you today!  Thanks for coming in.  If the palpitations worsen or interfere with exercise let me know  Continue to work on weight loss and exercise to prevent diabetes - mostly veggies and lower fat food avoiding many sweets or fat foods  If the painful sensations on your arms or legs worsen or change let me know   I will call you if your tests are not good.  Otherwise, I will send you a message on MyChart (if it is active) or a letter in the mail..  If you do not hear from me with in 2 weeks please call our office.     You need a Pap smear to prevent cervical cancer.  Please make an appointment.

## 2020-07-09 NOTE — Assessment & Plan Note (Signed)
BP Readings from Last 3 Encounters:  07/09/20 140/70  05/28/19 122/86  03/22/18 (!) 157/113   Continue to monitor at home

## 2020-07-09 NOTE — Assessment & Plan Note (Signed)
A1c is stable.  Continue diet and exercise

## 2020-07-09 NOTE — Progress Notes (Signed)
    SUBJECTIVE:   CHIEF COMPLAINT / HPI:   SORE THROAT  Mild bothers her when first wakes up then is gone.  No fever or chills or feeling bad.  Not taking any medications   HYPERLIPIDEMIA Not taking a statin for long time.  Made her blood sugar go up  PREDIABETES Watching her weight   POST COVID Taste is slowly improving.  No fever or shortness of breath   PALPITATION's Episode of heart beating irregularly or rapid at times since covid.  No shortness of breath or chest pain with exertion or leg swelling   FOCAL PAINFUL AREAS Pinpoint are on her L mid upper arm and R lower inner calf are intermittently tender.  No redness or skin changes or masses or injury    PERTINENT  PMH / PSH: Had covid in August   OBJECTIVE:   BP 140/70   Pulse 73   Ht 5\' 4"  (1.626 m)   Wt 204 lb 9.6 oz (92.8 kg)   LMP 06/17/2020   SpO2 99%   BMI 35.12 kg/m   Heart - Regular rate and rhythm.  No murmurs, gallops or rubs.    Lungs:  Normal respiratory effort, chest expands symmetrically. Lungs are clear to auscultation, no crackles or wheezes. Skin:  Intact without suspicious lesions or rashes Extrem - area that is mildly focally tender with normal skin and underlying tissue on L mid upper arm and R lower inner calf   ASSESSMENT/PLAN:   Elevated blood pressure reading BP Readings from Last 3 Encounters:  07/09/20 140/70  05/28/19 122/86  03/22/18 (!) 157/113   Continue to monitor at home   Hyperglycemia A1c is stable.  Continue diet and exercise   HYPERCHOLESTEROLEMIA She had stopped lipitor due to concerns about increasing her blood sugar.  Will check lipids.  Will likely need to restart   PALPITATIONS Recurrent.  Feels is due to recent covid.  Normal exam and no worrisome symptoms of syncope or angina.  Monitor    HYPERSENSITIVE SKIN AREA Unsure of cause.  Possibly a focal neuropathy due to covid?  Will observe    03/24/18, MD [facility]

## 2020-07-09 NOTE — Assessment & Plan Note (Signed)
Recurrent.  Feels is due to recent covid.  Normal exam and no worrisome symptoms of syncope or angina.  Monitor

## 2020-07-09 NOTE — Assessment & Plan Note (Addendum)
She had stopped lipitor due to concerns about increasing her blood sugar.  Will check lipids.  Will likely need to restart

## 2020-07-10 LAB — LIPID PANEL
Chol/HDL Ratio: 10.7 ratio — ABNORMAL HIGH (ref 0.0–4.4)
Cholesterol, Total: 374 mg/dL — ABNORMAL HIGH (ref 100–199)
HDL: 35 mg/dL — ABNORMAL LOW (ref >39)
LDL Chol Calc (NIH): 306 mg/dL — ABNORMAL HIGH (ref 0–99)
Triglycerides: 164 mg/dL — ABNORMAL HIGH (ref 0–149)
VLDL Cholesterol Cal: 33 mg/dL (ref 5–40)

## 2020-07-10 LAB — HEPATITIS C ANTIBODY: Hep C Virus Ab: <0.1 s/co ratio (ref 0.0–0.9)

## 2020-08-06 ENCOUNTER — Ambulatory Visit: Payer: Medicaid Other | Admitting: Family Medicine

## 2020-08-20 ENCOUNTER — Encounter: Payer: Self-pay | Admitting: Family Medicine

## 2020-08-22 ENCOUNTER — Ambulatory Visit: Payer: Medicaid Other | Admitting: Family Medicine

## 2020-10-27 ENCOUNTER — Encounter: Payer: Self-pay | Admitting: Family Medicine

## 2020-12-19 ENCOUNTER — Encounter: Payer: Self-pay | Admitting: Family Medicine

## 2021-03-21 ENCOUNTER — Other Ambulatory Visit: Payer: Self-pay

## 2021-03-21 ENCOUNTER — Ambulatory Visit: Payer: Medicaid Other | Admitting: Family Medicine

## 2021-03-21 ENCOUNTER — Encounter: Payer: Self-pay | Admitting: Family Medicine

## 2021-03-21 VITALS — BP 145/85 | HR 85 | Ht 64.0 in | Wt 212.0 lb

## 2021-03-21 DIAGNOSIS — R229 Localized swelling, mass and lump, unspecified: Secondary | ICD-10-CM

## 2021-03-21 DIAGNOSIS — L989 Disorder of the skin and subcutaneous tissue, unspecified: Secondary | ICD-10-CM

## 2021-03-21 NOTE — Patient Instructions (Addendum)
Thank you for coming in today.  I have referred you to the dermatologist.  You should get a call within a week.  If you do not hear from them within the second week please give [place] a call at [phone].  Follow-up after dermatologist for possible soft tissue ultrasound of upper right abdomen lesion if not addressed by dermatology.  Follow up with PCP about elevated blood pressure readings.   Dr. Salvadore Dom

## 2021-03-21 NOTE — Progress Notes (Signed)
    SUBJECTIVE:   CHIEF COMPLAINT / HPI:   Ms. Waddington is a 55 yo F who presents for the below.   Skin lesions Concern for multiple skin lesions on arm, leg, and right side. Hx of skin cancer (type unknown). Was going to dermatology regularly before COVID pandemic. No concerned with family member hx of melanoma. Describes lesions on skin as changing in size, color, and characteristics. Right sided flank with lump under skin that has grown in size over the last 6 months; "has always been there".  Denies fever, night sweats, RUQ pain. Would like a dermatology referral.   PERTINENT  PMH / PSH: Hx of skin sarcoidosis per chart review.   OBJECTIVE:   BP (!) 145/85   Pulse 85   Ht 5\' 4"  (1.626 m)   Wt 212 lb (96.2 kg)   LMP 03/10/2021 (Approximate)   SpO2 98%   BMI 36.39 kg/m   General: Appears well, no acute distress. Age appropriate. Skin: Warm and dry, Has multiple macular lesions with some raised roughness located on left forearm, left inner thigh, and right flank lump (Right subcutaneous mass 1.5 cm mobile, painless.)             Document Information  Photos  Left thigh  03/21/2021 10:39  Attached To:  Office Visit on 03/21/21 with 03/23/21, DO        Document Information  Photos  Left thigh  03/21/2021 10:39  Attached To:  Office Visit on 03/21/21 with Autry-Lott, 03/23/21, DO   Source Information  Autry-Lott, Randa Evens, Randa Evens  [facility] Resident    ASSESSMENT/PLAN:  Skin lesions Multiple macular lesions with some discoloration and patient reported change in characteristics. Fam Hx of melanoma. Chronic right subcutaneous mass 1.5 cm mobile, painless; concern for increasing size per patient negative B symptoms. Hx of skin sarcoidosis but is not painful or erythematous. Consider soft tissue ultrasound is not addressed by dermatology. - Ambulatory referral to Dermatology  [place], DO [facility]

## 2021-04-06 ENCOUNTER — Emergency Department
Admission: EM | Admit: 2021-04-06 | Discharge: 2021-04-07 | Disposition: A | Discharge status: Home / Self Care | Payer: Medicaid Other | Attending: Emergency Medicine | Admitting: Emergency Medicine

## 2021-04-06 ENCOUNTER — Encounter: Payer: Self-pay | Admitting: Emergency Medicine

## 2021-04-06 ENCOUNTER — Other Ambulatory Visit: Payer: Self-pay

## 2021-04-06 ENCOUNTER — Emergency Department: Payer: Medicaid Other

## 2021-04-06 DIAGNOSIS — I10 Essential (primary) hypertension: Secondary | ICD-10-CM | POA: Insufficient documentation

## 2021-04-06 DIAGNOSIS — R1011 Right upper quadrant pain: Secondary | ICD-10-CM | POA: Diagnosis present

## 2021-04-06 DIAGNOSIS — Z79899 Other long term (current) drug therapy: Secondary | ICD-10-CM | POA: Insufficient documentation

## 2021-04-06 LAB — CBC
HCT: 40.4 % (ref 36.0–46.0)
Hemoglobin: 13.6 g/dL (ref 12.0–15.0)
MCH: 29.5 pg (ref 26.0–34.0)
MCHC: 33.7 g/dL (ref 30.0–36.0)
MCV: 87.6 fL (ref 80.0–100.0)
Platelets: 326 10*3/uL (ref 150–400)
RBC: 4.61 MIL/uL (ref 3.87–5.11)
RDW: 12.7 % (ref 11.5–15.5)
WBC: 7.4 10*3/uL (ref 4.0–10.5)
nRBC: 0 % (ref 0.0–0.2)

## 2021-04-06 LAB — COMPREHENSIVE METABOLIC PANEL
ALT: 20 U/L (ref 0–44)
AST: 22 U/L (ref 15–41)
Albumin: 4 g/dL (ref 3.5–5.0)
Alkaline Phosphatase: 47 U/L (ref 38–126)
Anion gap: 6 (ref 5–15)
BUN: 8 mg/dL (ref 6–20)
CO2: 25 mmol/L (ref 22–32)
Calcium: 9.2 mg/dL (ref 8.9–10.3)
Chloride: 106 mmol/L (ref 98–111)
Creatinine, Ser: 0.65 mg/dL (ref 0.44–1.00)
GFR, Estimated: >60 mL/min (ref >60)
Glucose, Bld: 155 mg/dL — ABNORMAL HIGH (ref 70–99)
Potassium: 3.9 mmol/L (ref 3.5–5.1)
Sodium: 137 mmol/L (ref 135–145)
Total Bilirubin: 0.6 mg/dL (ref 0.3–1.2)
Total Protein: 7.8 g/dL (ref 6.5–8.1)

## 2021-04-06 LAB — LIPASE, BLOOD: Lipase: 32 U/L (ref 11–51)

## 2021-04-06 NOTE — ED Provider Notes (Signed)
[facility] Emergency Department Provider Note  ____________________________________________   Event Date/Time   First MD Initiated Contact with Patient 04/06/21 2304     (approximate)  I have reviewed the triage vital signs and the nursing notes.   HISTORY  Chief Complaint Abdominal Pain    HPI Haley Mclaughlin is a 55 y.o. female who presents for evaluation of 2 days of upper abdominal pain on the right side.  She does not remember specifically when or why it started.  She stated it was severe first and now it is mild to moderate, waxes and wanes, but never completely goes away.  Nothing in particular seems to make it better or worse, including no obvious effect when she eats or drinks.  She has had no nausea or vomiting.  She describes as an aching and pressure-like pain in her right upper quadrant just below her rib cage.  Sometimes the pain radiates to her right shoulder.  This has happened in the past but the episodes were milder and did not last as long.  She denies fever, sore throat, chest pain, shortness of breath, nausea, vomiting, diarrhea, and dysuria.     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 Authorizing Provider  atorvastatin (LIPITOR) 80 MG tablet Take 1 tablet (80 mg total) by mouth daily. Patient not taking: Reported on 07/09/2020 01/21/18   Carney Living, MD  hydrocortisone 2.5 % cream Apply topically 2 (two) times daily. 11/01/19   Carney Living, MD  ibuprofen (ADVIL,MOTRIN) 600 MG tablet Take 1 tablet (600 mg total) by mouth every 6 (six) hours as needed. 12/06/17   Dionne Bucy, MD  omeprazole (PRILOSEC) 40 MG capsule TAKE 1 CAPSULE BY MOUTH ONCE DAILY AS NEEDED 11/02/19   Carney Living, MD    Allergies Sulfamethoxazole-trimethoprim  Family History  Problem Relation Age of Onset   Heart disease Mother    Hypertension Mother    Diabetes Father    Heart disease Father    Anesthesia problems Neg Hx     Social History Social History   Tobacco Use   Smoking status: Never   Smokeless tobacco: Never  Substance Use Topics   Alcohol use: No   Drug use: No    Review of Systems Constitutional: No fever/chills Eyes: No visual changes. ENT: No sore throat. Cardiovascular: Denies chest pain. Respiratory: Denies shortness of breath. Gastrointestinal: Right upper quadrant aching and pressure-like abdominal pain with no nausea, vomiting, nor diarrhea. Genitourinary: Negative for dysuria. Musculoskeletal: Negative for neck pain.  Negative for back pain. Integumentary: Negative for rash. Neurological: Negative for headaches, focal weakness or numbness.   ____________________________________________   PHYSICAL EXAM:  VITAL SIGNS: ED Triage Vitals  Enc Vitals Group     BP 04/06/21 1645 (!) 158/115     Pulse Rate 04/06/21 1645 88     Resp 04/06/21 1645 16     Temp 04/06/21 1645 97.9 F (36.6 C)     Temp src --      SpO2 04/06/21 1645 97 %     Weight 04/06/21 1646 96.2 kg (212 lb)     Height 04/06/21 1646 1.626 m (5\' 4" )     Head Circumference --       Peak Flow --      Pain Score 04/06/21 1646 3     Pain Loc --      Pain Edu? --      Excl. in GC? --     Constitutional: Alert and oriented.  Eyes: Conjunctivae are normal.  Head: Atraumatic. Nose: No congestion/rhinnorhea. Mouth/Throat: Patient is wearing a mask. Neck: No stridor.  No meningeal signs.   Cardiovascular: Normal rate, regular rhythm. Good peripheral circulation. Respiratory: Normal respiratory effort.  No retractions. Gastrointestinal: Soft and nondistended.  No epigastric tenderness but she does have some mild tenderness to the right upper quadrant with equivocal Murphy sign. Musculoskeletal: No lower extremity tenderness nor edema. No gross deformities of extremities. Neurologic:  Normal speech and language. No gross focal neurologic deficits are appreciated.  Skin:  Skin is warm, dry and intact. Psychiatric: Mood and affect are normal. Speech and behavior are normal.  ____________________________________________   LABS (all labs ordered are listed, but only abnormal results are displayed)  Labs Reviewed  COMPREHENSIVE METABOLIC PANEL - Abnormal; Notable for the following components:      Result Value   Glucose, Bld 155 (*)    All other components within normal limits  URINALYSIS, COMPLETE (UACMP) WITH MICROSCOPIC - Abnormal; Notable for the following components:   Color, Urine STRAW (*)    APPearance CLEAR (*)    All other components within normal limits  LIPASE, BLOOD  CBC  POC URINE PREG, ED   ____________________________________________   RADIOLOGY I, 06/06/21, personally viewed and evaluated these images (plain radiographs) as part of my medical decision making, as well as reviewing the written report by the radiologist.  ED MD interpretation:  Normal exam  Official radiology report(s): Loleta Rose ABDOMEN LIMITED RUQ (LIVER/GB)  Result Date: 04/07/2021 CLINICAL DATA:  Right upper quadrant pain EXAM: ULTRASOUND ABDOMEN LIMITED RIGHT UPPER QUADRANT  COMPARISON:  None. FINDINGS: Gallbladder: No gallstones or wall thickening visualized. No sonographic Murphy sign noted by sonographer. Common bile duct: Diameter: Normal caliber, 4 mm Liver: No focal lesion identified. Within normal limits in parenchymal echogenicity. Portal vein is patent on color Doppler imaging with normal direction of blood flow towards the liver. Other: None. IMPRESSION: No acute findings. Electronically Signed   By: 06/07/2021 M.D.   On: 04/07/2021 00:02    ____________________________________________   PROCEDURES   Procedure(s) performed (including Critical Care):  Procedures   ____________________________________________   INITIAL IMPRESSION / MDM / ASSESSMENT AND PLAN / ED COURSE  As part of my medical decision making, I reviewed the following data within the electronic MEDICAL RECORD NUMBER Nursing notes reviewed and incorporated, Labs reviewed , Old chart reviewed, and Notes from prior ED visits   Differential diagnosis includes, but is not limited to, biliary colic or other biliary/hepatic issue,  acid reflux, pancreatitis, musculoskeletal strain.  No chest pain or shortness of breath.  Stable vital signs other than hypertension but this is likely situational as well as due to essential hypertension.  CBC, comprehensive metabolic panel, and lipase are all within normal limits.  Patient denies having any urinary symptoms and this is unlikely to be the cause of right upper quadrant abdominal pain.  I explained to the patient that her work-up is reassuring thus far and her physical exam is generally reassuring as well.  I will check an ultrasound to see if she has gallstones to help her with diagnosis and follow-up but at this point I do not think she would benefit from CT scan.  She understands and agrees with plan.  She does not need analgesia nor antiemetics at this time.     Clinical Course as of 04/07/21 0041  Mon Apr 07, 2021  0028 US ABDOMEN LIMITED RUQ  (LIVER/GB) Ultrasound is reassuring.  No indication of any emergent or chronic condition.  I updated the patient that we do not have a specific explanation for symptoms but she is comfortable and agreeable to the plan for discharge.  She said that she already takes Prilosec and I encouraged her to continue doing so and to follow-up with her primary care doctor.  I gave my usual and customary return precautions and she understands and agrees with the plan.   [CF]  0041 Urinalysis, Complete w Microscopic Urine, Clean Catch(!) Normal UA [CF]    Clinical Course User Index [CF] Loleta Rose, MD     ____________________________________________  FINAL CLINICAL IMPRESSION(S) / ED DIAGNOSES  Final diagnoses:  RUQ abdominal pain     MEDICATIONS GIVEN DURING THIS VISIT:  Medications - No data to display   ED Discharge Orders     None        Note:  This document was prepared using Dragon voice recognition software and may include unintentional dictation errors.   Loleta Rose, MD 04/07/21 [phone]

## 2021-04-06 NOTE — ED Notes (Signed)
US being performed at the bedside

## 2021-04-06 NOTE — ED Notes (Signed)
MD at the bedside  

## 2021-04-06 NOTE — ED Triage Notes (Signed)
Pt to ED via POV c/o abdominal pain x 2 days. Pt states that the pain got worse today. Pt went to  urgent care and was told to come here. Pt denies any other symptoms. Pt states that in the past she has gotten sharp pain in her right shoulder with the RUQ pain but does not currently have that pain at this time.

## 2021-04-07 LAB — POC URINE PREG, ED: Preg Test, Ur: NEGATIVE

## 2021-04-07 LAB — URINALYSIS, COMPLETE (UACMP) WITH MICROSCOPIC
Bacteria, UA: NONE SEEN
Bilirubin Urine: NEGATIVE
Glucose, UA: NEGATIVE mg/dL
Hgb urine dipstick: NEGATIVE
Ketones, ur: NEGATIVE mg/dL
Leukocytes,Ua: NEGATIVE
Nitrite: NEGATIVE
Protein, ur: NEGATIVE mg/dL
Specific Gravity, Urine: 1.006 (ref 1.005–1.030)
pH: 6 (ref 5.0–8.0)

## 2021-04-07 NOTE — Discharge Instructions (Signed)
You have been seen in the Emergency Department (ED) for abdominal pain.  Your evaluation did not identify a clear cause of your symptoms but was generally reassuring.  Your lab work was within normal limits and your ultrasound was normal showing no sign of liver nor gallbladder abnormalities.  Please follow up as instructed above regarding today's emergent visit and the symptoms that are bothering you.  Return to the ED if your abdominal pain worsens or fails to improve, you develop bloody vomiting, bloody diarrhea, you are unable to tolerate fluids due to vomiting, fever greater than 101, or other symptoms that concern you.

## 2021-05-12 ENCOUNTER — Other Ambulatory Visit: Payer: Self-pay | Admitting: Family Medicine

## 2021-05-12 DIAGNOSIS — Z1231 Encounter for screening mammogram for malignant neoplasm of breast: Secondary | ICD-10-CM

## 2021-06-11 ENCOUNTER — Ambulatory Visit
Admission: RE | Admit: 2021-06-11 | Discharge: 2021-06-11 | Disposition: A | Discharge status: Home / Self Care | Payer: Medicaid Other | Source: Ambulatory Visit | Attending: Family Medicine | Admitting: Family Medicine

## 2021-06-11 ENCOUNTER — Other Ambulatory Visit: Payer: Self-pay

## 2021-06-11 DIAGNOSIS — Z1231 Encounter for screening mammogram for malignant neoplasm of breast: Secondary | ICD-10-CM

## 2021-06-12 ENCOUNTER — Encounter: Payer: Self-pay | Admitting: Family Medicine

## 2021-07-03 ENCOUNTER — Ambulatory Visit: Payer: Medicaid Other | Admitting: Family Medicine

## 2021-07-03 ENCOUNTER — Other Ambulatory Visit: Payer: Self-pay

## 2021-07-03 ENCOUNTER — Ambulatory Visit (HOSPITAL_COMMUNITY)
Admission: RE | Admit: 2021-07-03 | Discharge: 2021-07-03 | Disposition: A | Discharge status: Home / Self Care | Payer: Medicaid Other | Source: Ambulatory Visit | Attending: Family Medicine | Admitting: Family Medicine

## 2021-07-03 ENCOUNTER — Encounter: Payer: Self-pay | Admitting: Family Medicine

## 2021-07-03 VITALS — BP 184/102 | HR 81 | Ht 64.0 in | Wt 213.2 lb

## 2021-07-03 DIAGNOSIS — D869 Sarcoidosis, unspecified: Secondary | ICD-10-CM

## 2021-07-03 DIAGNOSIS — E1159 Type 2 diabetes mellitus with other circulatory complications: Secondary | ICD-10-CM | POA: Diagnosis not present

## 2021-07-03 DIAGNOSIS — E7800 Pure hypercholesterolemia, unspecified: Secondary | ICD-10-CM

## 2021-07-03 DIAGNOSIS — R002 Palpitations: Secondary | ICD-10-CM

## 2021-07-03 DIAGNOSIS — R1900 Intra-abdominal and pelvic swelling, mass and lump, unspecified site: Secondary | ICD-10-CM

## 2021-07-03 DIAGNOSIS — Z131 Encounter for screening for diabetes mellitus: Secondary | ICD-10-CM

## 2021-07-03 DIAGNOSIS — R5383 Other fatigue: Secondary | ICD-10-CM | POA: Diagnosis not present

## 2021-07-03 DIAGNOSIS — I152 Hypertension secondary to endocrine disorders: Secondary | ICD-10-CM | POA: Diagnosis not present

## 2021-07-03 DIAGNOSIS — E1165 Type 2 diabetes mellitus with hyperglycemia: Secondary | ICD-10-CM | POA: Diagnosis not present

## 2021-07-03 DIAGNOSIS — I10 Essential (primary) hypertension: Secondary | ICD-10-CM | POA: Diagnosis not present

## 2021-07-03 DIAGNOSIS — E782 Mixed hyperlipidemia: Secondary | ICD-10-CM

## 2021-07-03 DIAGNOSIS — D863 Sarcoidosis of skin: Secondary | ICD-10-CM | POA: Diagnosis not present

## 2021-07-03 LAB — POCT GLYCOSYLATED HEMOGLOBIN (HGB A1C): HbA1c, POC (controlled diabetic range): 6.9 % (ref 0.0–7.0)

## 2021-07-03 MED ORDER — AMLODIPINE BESYLATE 5 MG PO TABS: 5.0000 mg | ORAL_TABLET | Freq: Every day | ORAL | 1 refills | Status: DC

## 2021-07-03 MED ORDER — METFORMIN HCL ER 500 MG PO TB24: 500.0000 mg | ORAL_TABLET | Freq: Every day | ORAL | 3 refills | Status: DC

## 2021-07-03 NOTE — Patient Instructions (Addendum)
It was a pleasure to see you today!  We will get some labs today.  If they are abnormal or we need to do something about them, I will call you.  If they are normal, I will send you a message on MyChart (if it is active) or a letter in the mail.  If you don't hear from [place] in 2 weeks, please call the office  [phone]. For your blood pressure: start taking amlodipine 5 mg once a day. Follow up in 2 weeks. If you have bad headaches or leg swelling, let our office know. For the lump on your side: we are going to get a CT of your abdomen. We will call you with an appointment for this as it will need to be approved by insurance first. For the hand swelling, palpitations: we will get an echo. This will have to be approved by insurance first so we will call you with an appointment date A1c: you have diabetes with an A1c 6.9%. Start taking one tablet of metformin per day, 500 mg. If you have problems with diarrhea or upset stomach, please call our office Follow up in 2 weeks    Be Well,  Dr. Leary Roca

## 2021-07-03 NOTE — Progress Notes (Signed)
SUBJECTIVE:   CHIEF COMPLAINT / HPI: palpitations, concern for diabetes, lump on side  Hypertension: BP elevated to 184/102. Reviewing flowsheets, patient had similar BP 180s/100s at last visit. Prior to that in 2022-2021 BP tended to be 140s/80s. She is not on any medication. Denies any headaches or blurry vision.  HLD: patient reports that her family has a history of very elevated cholesterol and states that last year she had high cholesterol and would like to get this checked today. CMP and Lipid panel ordered.  DM: patient reports that she has had prediabetes in the past, but notes that she gained weight recently and is concerned that her A1c may have worsened. She denies polyuria, polydipsia. Will obtain A1c today.  Palpitations: patient notes that she has had palpitations on a daily basis that can be quite bothersome. She denies chest pain, syncope, and dyspnea, no orthopnea. She notes that she has had swelling of her face and hands recently, cannot wear her wedding bands right now. She does yard work and house work and has no problems doing these activities, but has noticed she has less overall energy and is more fatigued generally. Will obtain CBC for fatigue to assess anemia, echo given hand swelling and uncontrolled hypertension. Last ekg in 2019 was WNL, will obtain one today as well for updated baseline.   Ride side "lump": patient reports that for the last year she has had a lump on her right side that is very bothersome and tender to palpation. She thinks it has been getting bigger. It was evaluated by a dermatologist who had recommend an ultrasound. She also has had LFTs in August that were WNL, but 1 year ago had extremely elevated total cholesterol, LDL, triglycerides. RUQ [place] in August was normal, but did not evaluate soft tissue inferior to costal margin on right side.  PERTINENT  PMH / PSH: HLD, hyperglycemia, elevated blood pressure  OBJECTIVE:   BP (!) 184/102   Pulse 81    Ht 5\' 4"  (1.626 m)   Wt 213 lb 3.2 oz (96.7 kg)   LMP 06/29/2021   SpO2 96%   BMI 36.60 kg/m   Nursing note and vitals reviewed GEN: age-appropriate WW, resting comfortably in chair, NAD, class II obesity Cardiac: Regular rate and rhythm. Normal S1/S2. No murmurs, rubs, or gallops appreciated. 2+ radial pulses. Lungs: Clear bilaterally to ascultation. No increased WOB, no accessory muscle usage. No w/r/r. Abdomen: Normoactive bowel sounds. No rebound or guarding.  +TTP over mass on RUQ. Difficult to differentiate wether HSM vs soft tissue mass. 5 cm x 2.5 cm mass about 2 inches below right costal margin between mid-clavicular and mid-axillary line  Neuro: AOx3  Ext: 1+ edema present in hands Psych: Pleasant and appropriate   ASSESSMENT/PLAN:   Type 2 diabetes mellitus with hyperglycemia (HCC) A1c 6.9% today. Patient reports that she had previously been prescribed metformin, but never took it. Counseled on diabetes and diet. Recommend follow up in 2 weeks with PCP. Will start metformin 500 mg qd and titrate up.  Hypertension associated with diabetes [facility]) Patient qualifies for diagnosis of hypertension now with multiple visits with BP >140/80, and with last two visits 180s/100s. Would like to start ARB for renal protection, however, insurance formulary only with losartan-hctz combo. Patient may benefit from this in the future, but she reports that she is very afraid of starting multiple medications as she is very sensitive to medication. Will start tx with amlodipine 5 mg, follow up in 2  weeks.   Lofgren's syndrome Previously followed at [facility] pulmonology, though patient has not been there since prior to the COVID pandemic. Had normal PFTs in 2016. Diagnosed by cutaneous sarcoidosis bx in 2014, with hilar adenopathy, erythema nodosum and polyarthritis found in 2014. Previously incorrectly entered as "loeffler's syndrome" into Epic problem list. Recommend follow up with [facility]  pulmonology.  HYPERCHOLESTEROLEMIA Patient has very elevated lipids: t chol 371, HDL 37, LDL 311, TGL 123. ASCVD risk is 50% on [facility] calculator. Patient will need high-intensity statin, likely recommend follow up with cardiology lipid clinic as further modulation will likely be necessary. Also recommend evaluation for lipoprotein a. Follow up in 2 weeks with PCP.  Palpitations Pt has palpitations on a daily basis in setting of very elevated hypertension and hyperlipidemia and significant peripheral edema of UEs. EKG today normal sinus rhythm. Recommend echo given hypertension and peripheral edema. Upon digging into care everywhere history, patient has had a diagnosis of lofgren's syndrome (incorrectly entered as loeffler's syndrome on Epic problem list) and cutaneous sarcoidosis since 2014, previously followed by dermatology and pulmonology at [facility], but she hasn't been there since COVID pandemic. Given history of sarcoidosis, patient should follow up with pulmonology, but also cardiology as well since this organ could develop sarcoidosis over time. She had a cardiac MRI in 2016 that was normal.   Mass of soft tissue of abdomen Patient has a mass on her RUQ that is tender to palpation and she reports it is increasing in size. Patient has exceedingly elevated hyperlipidemia as well as a history of cutaneous sarcoidosis. POC [place] used with Dr. Manson Passey at bedside, not for diagnostic purposes, however US shows mass goes deeper than 3 cm. Recommend CT abdomen with contrast for better characterization.   Shirlean Mylar, MD [facility]

## 2021-07-04 DIAGNOSIS — I152 Hypertension secondary to endocrine disorders: Secondary | ICD-10-CM | POA: Insufficient documentation

## 2021-07-04 DIAGNOSIS — R1900 Intra-abdominal and pelvic swelling, mass and lump, unspecified site: Secondary | ICD-10-CM | POA: Insufficient documentation

## 2021-07-04 DIAGNOSIS — D863 Sarcoidosis of skin: Secondary | ICD-10-CM | POA: Insufficient documentation

## 2021-07-04 DIAGNOSIS — E1159 Type 2 diabetes mellitus with other circulatory complications: Secondary | ICD-10-CM | POA: Insufficient documentation

## 2021-07-04 LAB — CBC WITH DIFFERENTIAL/PLATELET
Basophils Absolute: 0.1 10*3/uL (ref 0.0–0.2)
Basos: 1 %
EOS (ABSOLUTE): 0.1 10*3/uL (ref 0.0–0.4)
Eos: 3 %
Hematocrit: 40.5 % (ref 34.0–46.6)
Hemoglobin: 13.4 g/dL (ref 11.1–15.9)
Immature Grans (Abs): 0 10*3/uL (ref 0.0–0.1)
Immature Granulocytes: 0 %
Lymphocytes Absolute: 2.5 10*3/uL (ref 0.7–3.1)
Lymphs: 45 %
MCH: 28.8 pg (ref 26.6–33.0)
MCHC: 33.1 g/dL (ref 31.5–35.7)
MCV: 87 fL (ref 79–97)
Monocytes Absolute: 0.3 10*3/uL (ref 0.1–0.9)
Monocytes: 5 %
Neutrophils Absolute: 2.6 10*3/uL (ref 1.4–7.0)
Neutrophils: 46 %
Platelets: 309 10*3/uL (ref 150–450)
RBC: 4.65 x10E6/uL (ref 3.77–5.28)
RDW: 12.4 % (ref 11.7–15.4)
WBC: 5.6 10*3/uL (ref 3.4–10.8)

## 2021-07-04 LAB — LIPID PANEL
Chol/HDL Ratio: 10 ratio — ABNORMAL HIGH (ref 0.0–4.4)
Cholesterol, Total: 371 mg/dL — ABNORMAL HIGH (ref 100–199)
HDL: 37 mg/dL — ABNORMAL LOW (ref >39)
LDL Chol Calc (NIH): 311 mg/dL — ABNORMAL HIGH (ref 0–99)
Triglycerides: 123 mg/dL (ref 0–149)
VLDL Cholesterol Cal: 23 mg/dL (ref 5–40)

## 2021-07-04 LAB — BASIC METABOLIC PANEL
BUN/Creatinine Ratio: 13 (ref 9–23)
BUN: 9 mg/dL (ref 6–24)
CO2: 25 mmol/L (ref 20–29)
Calcium: 9.6 mg/dL (ref 8.7–10.2)
Chloride: 105 mmol/L (ref 96–106)
Creatinine, Ser: 0.67 mg/dL (ref 0.57–1.00)
Glucose: 148 mg/dL — ABNORMAL HIGH (ref 70–99)
Potassium: 4.6 mmol/L (ref 3.5–5.2)
Sodium: 141 mmol/L (ref 134–144)
eGFR: 104 mL/min/{1.73_m2} (ref >59)

## 2021-07-04 NOTE — Assessment & Plan Note (Signed)
Patient qualifies for diagnosis of hypertension now with multiple visits with BP >140/80, and with last two visits 180s/100s. Would like to start ARB for renal protection, however, insurance formulary only with losartan-hctz combo. Patient may benefit from this in the future, but she reports that she is very afraid of starting multiple medications as she is very sensitive to medication. Will start tx with amlodipine 5 mg, follow up in 2 weeks.

## 2021-07-04 NOTE — Assessment & Plan Note (Signed)
A1c 6.9% today. Patient reports that she had previously been prescribed metformin, but never took it. Counseled on diabetes and diet. Recommend follow up in 2 weeks with PCP. Will start metformin 500 mg qd and titrate up.

## 2021-07-04 NOTE — Assessment & Plan Note (Addendum)
Previously followed at [facility] pulmonology, though patient has not been there since prior to the COVID pandemic. Had normal PFTs in 2016. Diagnosed by cutaneous sarcoidosis bx in 2014, with hilar adenopathy, erythema nodosum and polyarthritis found in 2014. Previously incorrectly entered as "loeffler's syndrome" into Epic problem list. Recommend follow up with [facility] pulmonology.

## 2021-07-04 NOTE — Assessment & Plan Note (Signed)
Patient has very elevated lipids: t chol 371, HDL 37, LDL 311, TGL 123. ASCVD risk is 50% on [facility] calculator. Patient will need high-intensity statin, likely recommend follow up with cardiology lipid clinic as further modulation will likely be necessary. Also recommend evaluation for lipoprotein a. Follow up in 2 weeks with PCP.

## 2021-07-04 NOTE — Assessment & Plan Note (Signed)
Pt has palpitations on a daily basis in setting of very elevated hypertension and hyperlipidemia and significant peripheral edema of UEs. EKG today normal sinus rhythm. Recommend echo given hypertension and peripheral edema. Upon digging into care everywhere history, patient has had a diagnosis of lofgren's syndrome (incorrectly entered as loeffler's syndrome on Epic problem list) and cutaneous sarcoidosis since 2014, previously followed by dermatology and pulmonology at [facility], but she hasn't been there since COVID pandemic. Given history of sarcoidosis, patient should follow up with pulmonology, but also cardiology as well since this organ could develop sarcoidosis over time. She had a cardiac MRI in 2016 that was normal.

## 2021-07-04 NOTE — Assessment & Plan Note (Signed)
Patient has a mass on her RUQ that is tender to palpation and she reports it is increasing in size. Patient has exceedingly elevated hyperlipidemia as well as a history of cutaneous sarcoidosis. POC [place] used with Dr. Manson Passey at bedside, not for diagnostic purposes, however US shows mass goes deeper than 3 cm. Recommend CT abdomen with contrast for better characterization.

## 2021-07-11 ENCOUNTER — Other Ambulatory Visit: Payer: Self-pay

## 2021-07-11 ENCOUNTER — Encounter (HOSPITAL_COMMUNITY): Payer: Self-pay

## 2021-07-11 ENCOUNTER — Ambulatory Visit (HOSPITAL_COMMUNITY)
Admission: RE | Admit: 2021-07-11 | Discharge: 2021-07-11 | Disposition: A | Discharge status: Home / Self Care | Payer: Medicaid Other | Source: Ambulatory Visit | Attending: Family Medicine | Admitting: Family Medicine

## 2021-07-11 DIAGNOSIS — R1900 Intra-abdominal and pelvic swelling, mass and lump, unspecified site: Secondary | ICD-10-CM | POA: Insufficient documentation

## 2021-07-11 MED ORDER — IOHEXOL 350 MG/ML SOLN
80.0000 mL | Freq: Once | INTRAVENOUS | Status: AC | PRN
  Administered 2021-07-11: 80 mL via INTRAVENOUS

## 2021-07-21 ENCOUNTER — Encounter: Payer: Self-pay | Admitting: Family Medicine

## 2021-07-21 DIAGNOSIS — I700 Atherosclerosis of aorta: Secondary | ICD-10-CM

## 2021-07-21 HISTORY — DX: Atherosclerosis of aorta: I70.0

## 2021-08-13 ENCOUNTER — Ambulatory Visit (INDEPENDENT_AMBULATORY_CARE_PROVIDER_SITE_OTHER): Payer: Medicaid Other | Admitting: Family Medicine

## 2021-08-13 ENCOUNTER — Other Ambulatory Visit: Payer: Self-pay

## 2021-08-13 ENCOUNTER — Encounter: Payer: Self-pay | Admitting: Family Medicine

## 2021-08-13 VITALS — BP 149/92 | HR 87 | Wt 215.2 lb

## 2021-08-13 DIAGNOSIS — I152 Hypertension secondary to endocrine disorders: Secondary | ICD-10-CM

## 2021-08-13 DIAGNOSIS — R0609 Other forms of dyspnea: Secondary | ICD-10-CM

## 2021-08-13 DIAGNOSIS — E1159 Type 2 diabetes mellitus with other circulatory complications: Secondary | ICD-10-CM | POA: Diagnosis not present

## 2021-08-13 DIAGNOSIS — R002 Palpitations: Secondary | ICD-10-CM

## 2021-08-13 DIAGNOSIS — Z1211 Encounter for screening for malignant neoplasm of colon: Secondary | ICD-10-CM

## 2021-08-13 DIAGNOSIS — E7800 Pure hypercholesterolemia, unspecified: Secondary | ICD-10-CM

## 2021-08-13 MED ORDER — ATORVASTATIN CALCIUM 80 MG PO TABS: 80.0000 mg | ORAL_TABLET | Freq: Every day | ORAL | 3 refills | Status: DC

## 2021-08-13 NOTE — Patient Instructions (Signed)
Good to see you today - Thank you for coming in  Things we discussed today:  Shortness of breath - Contact [facility] for an check up - I will refer you to cardiology.  They should call you if you do not hear from them in 2 weeks call me - If the shortness of breath suddenly get worse or sever chest pain go to the ER  Cholesterol Take lipitor every day  Elbow - lateral epicondylitis  Aspercreme four times a day on the sore spot along with ice  You need a Pap smear to prevent cervical cancer.  Please make an appointment.   To screen for colon cancer I have ordered a Cologuard test to be sent to your home.  Please use it and send it in.  If you have questions you can call [phone].        Please always bring your medication bottles  Come back to see me in 2 months

## 2021-08-13 NOTE — Assessment & Plan Note (Signed)
Without hypoxia.  Recent lab work up, cbc bmet, unremarkable.  Has history of anxiety but also severe hyperlipidemia and Lofgren syndrome with pulmonary involvement in the past and current bothersome palpitations.  Asked her to contact her [facility] pulmonologist and put in referral for cardiology. She has an echo scheduled next week.

## 2021-08-13 NOTE — Assessment & Plan Note (Signed)
Associated with shortness of breath.  Will refer to cardiology for work up of possible SVT or other arrhythmia

## 2021-08-13 NOTE — Assessment & Plan Note (Signed)
Severe.  She will start Lipitor again.  Will check effectiveness at follow up.  May need more intensive therapy. Although she is very leery of medications

## 2021-08-13 NOTE — Progress Notes (Signed)
° ° °  SUBJECTIVE:   CHIEF COMPLAINT / HPI:   Saw Dr Leary Roca on 11/3 for   Hypertension associated with diabetes [facility]) She is not taking amlodipine.  Really want to minimize any medications. She wants to monitor at home   Dyspnea Feels that over the last few months has had worsening dyspnea. Works for [employer] and become more short of breath than she feels is normal after going up steps.  No chest pain or edema or fever.  History of Lofgren syndrome with lung involvement and has followed with [facility] pulmonology in past.  Has had a cardiac MRI in 2019 or so.     HYPERCHOLESTEROLEMIA Not taking her lipitor.  Does not like to take medications.  She is willing to restart this.   Palpitations Continues to have palpitations at rest and sometimes when moving around.  No syncope   Mass of soft tissue of abdomen Continues to feel a firm vague area mid right lateral side.  Feels may be a little bigger since can now feel when sitting.  Nontender.  No nausea and vomiting.  CT of abdomen was normal   PERTINENT  PMH / PSH: Last saw cardiology in 2013   OBJECTIVE:   BP (!) 149/92    Pulse 87    Wt 215 lb 3.2 oz (97.6 kg)    SpO2 100%    BMI 36.94 kg/m   Heart - Regular rate and rhythm.  No murmurs, gallops or rubs.    Lungs:  Normal respiratory effort, chest expands symmetrically. Lungs are clear to auscultation, no crackles or wheezes. Abdomen - vague superficial feeling firmness R lateral abdomen wall.  Nontender not fluctuant  No pedal edema  ASSESSMENT/PLAN:   Hypertension associated with diabetes (HCC) BP Readings from Last 3 Encounters:  08/13/21 (!) 149/92  07/03/21 (!) 184/102  04/06/21 (!) 183/105   Not well controlled but better than in past.  Does not want to take medications.  Suggest follow at home and healthy diet.  May need ambulatory blood pressure monitoring   Palpitations Associated with shortness of breath.  Will refer to cardiology for work up of possible SVT or other  arrhythmia   HYPERCHOLESTEROLEMIA Severe.  She will start Lipitor again.  Will check effectiveness at follow up.  May need more intensive therapy. Although she is very leery of medications   Dyspnea Without hypoxia.  Recent lab work up, cbc bmet, unremarkable.  Has history of anxiety but also severe hyperlipidemia and Lofgren syndrome with pulmonary involvement in the past and current bothersome palpitations.  Asked her to contact her [facility] pulmonologist and put in referral for cardiology. She has an echo scheduled next week.       Carney Living, MD [facility]

## 2021-08-13 NOTE — Assessment & Plan Note (Addendum)
BP Readings from Last 3 Encounters:  08/13/21 (!) 149/92  07/03/21 (!) 184/102  04/06/21 (!) 183/105   Not well controlled but better than in past.  Does not want to take medications.  Suggest follow at home and healthy diet.  May need ambulatory blood pressure monitoring

## 2021-08-15 ENCOUNTER — Telehealth: Payer: Self-pay | Admitting: *Deleted

## 2021-08-15 DIAGNOSIS — D869 Sarcoidosis, unspecified: Secondary | ICD-10-CM

## 2021-08-15 NOTE — Telephone Encounter (Signed)
Referral placed  Thanks  LC

## 2021-08-15 NOTE — Telephone Encounter (Signed)
Patient called [facility] and they need a new referral since patient hasn't been there in 3 years.  Will ask provider to place this and I will send it over.  Haley Mcloughlin Glory Rosebush, MD   [address]   [address] [place] [address]   [place], [place] [address]   [phone] (Work)   [phone] (Fax)

## 2021-08-19 ENCOUNTER — Ambulatory Visit (HOSPITAL_COMMUNITY)
Admission: RE | Admit: 2021-08-19 | Discharge: 2021-08-19 | Disposition: A | Discharge status: Home / Self Care | Payer: Medicaid Other | Source: Ambulatory Visit | Attending: Family Medicine | Admitting: Family Medicine

## 2021-08-19 DIAGNOSIS — I10 Essential (primary) hypertension: Secondary | ICD-10-CM | POA: Diagnosis not present

## 2021-08-19 DIAGNOSIS — R002 Palpitations: Secondary | ICD-10-CM | POA: Diagnosis not present

## 2021-08-19 DIAGNOSIS — E119 Type 2 diabetes mellitus without complications: Secondary | ICD-10-CM | POA: Insufficient documentation

## 2021-08-19 DIAGNOSIS — E785 Hyperlipidemia, unspecified: Secondary | ICD-10-CM | POA: Diagnosis not present

## 2021-08-19 DIAGNOSIS — R012 Other cardiac sounds: Secondary | ICD-10-CM | POA: Diagnosis not present

## 2021-08-19 LAB — ECHOCARDIOGRAM COMPLETE
Area-P 1/2: 3.53 cm2
MV VTI: 3.27 cm2
S' Lateral: 2.8 cm

## 2021-08-19 NOTE — Progress Notes (Signed)
°  Echocardiogram 2D Echocardiogram has been performed.  Gerda Diss 08/19/2021, 8:49 AM

## 2021-08-28 ENCOUNTER — Other Ambulatory Visit: Payer: Self-pay

## 2021-08-28 ENCOUNTER — Ambulatory Visit: Payer: Medicaid Other | Admitting: Cardiology

## 2021-08-28 ENCOUNTER — Encounter: Payer: Self-pay | Admitting: Cardiology

## 2021-08-28 VITALS — BP 160/110 | HR 98 | Ht 64.0 in | Wt 214.4 lb

## 2021-08-28 DIAGNOSIS — R0600 Dyspnea, unspecified: Secondary | ICD-10-CM | POA: Diagnosis not present

## 2021-08-28 DIAGNOSIS — E7800 Pure hypercholesterolemia, unspecified: Secondary | ICD-10-CM | POA: Diagnosis not present

## 2021-08-28 DIAGNOSIS — R072 Precordial pain: Secondary | ICD-10-CM | POA: Diagnosis not present

## 2021-08-28 DIAGNOSIS — I10 Essential (primary) hypertension: Secondary | ICD-10-CM

## 2021-08-28 MED ORDER — IVABRADINE HCL 7.5 MG PO TABS: 15.0000 mg | ORAL_TABLET | Freq: Once | ORAL | 0 refills | Status: AC

## 2021-08-28 MED ORDER — LOSARTAN POTASSIUM 50 MG PO TABS: 50.0000 mg | ORAL_TABLET | Freq: Every day | ORAL | 3 refills | Status: DC

## 2021-08-28 MED ORDER — METOPROLOL TARTRATE 100 MG PO TABS: 100.0000 mg | ORAL_TABLET | Freq: Once | ORAL | 0 refills | Status: DC

## 2021-08-28 NOTE — Patient Instructions (Signed)
Medication Instructions:   Your physician has recommended you make the following change in your medication:    START taking Losartan 50 MG once a day.  *If you need a refill on your cardiac medications before your next appointment, please call your pharmacy*   Lab Work:  BMP will be drawn in office today.  If you have labs (blood work) drawn today and your tests are completely normal, you will receive your results only by: MyChart Message (if you have MyChart) OR A paper copy in the mail If you have any lab test that is abnormal or we need to change your treatment, we will call you to review the results.   Testing/Procedures:  Your cardiac CT will be scheduled at:  [facility] [address] [place], [place] [address] [phone]  Please arrive 15 mins early for check-in and test prep.    Please follow these instructions carefully (unless otherwise directed):    On the Night Before the Test: Be sure to Drink plenty of water. Do not consume any caffeinated/decaffeinated beverages or chocolate 12 hours prior to your test.    On the Day of the Test: Drink plenty of water until 1 hour prior to the test. Do not eat any food 4 hours prior to the test. You may take your regular medications prior to the test.  Take metoprolol (Lopressor) 100 MG two hours prior to test. Take Ivabradine (Corlanor) 15 MG two hours prior to test. FEMALES- please wear underwire-free bra if available, avoid dresses & tight clothing        After the Test: Drink plenty of water. After receiving IV contrast, you may experience a mild flushed feeling. This is normal. On occasion, you may experience a mild rash up to 24 hours after the test. This is not dangerous. If this occurs, you can take Benadryl 25 mg and increase your fluid intake. If you experience trouble breathing, this can be serious. If it is severe call 911 IMMEDIATELY. If it is mild,  please call our office. If you take any of these medications: Glipizide/Metformin, Avandament, Glucavance, please do not take 48 hours after completing test unless otherwise instructed.  Please allow 2-4 weeks for scheduling of routine cardiac CTs. Some insurance companies require a pre-authorization which may delay scheduling of this test.   For non-scheduling related questions, please contact the cardiac imaging nurse navigator should you have any questions/concerns: Rockwell Alexandria, Cardiac Imaging Nurse Navigator Larey Brick, Cardiac Imaging Nurse Navigator [facility] Direct Office Dial: [phone]   For scheduling needs, including cancellations and rescheduling, please call [place], [phone].     Follow-Up: At [facility], you and your health needs are our priority.  As part of our continuing mission to provide you with exceptional heart care, we have created designated Provider Care Teams.  These Care Teams include your primary Cardiologist (physician) and Advanced Practice Providers (APPs -  Physician Assistants and Nurse Practitioners) who all work together to provide you with the care you need, when you need it.  We recommend signing up for the patient portal called "MyChart".  Sign up information is provided on this After Visit Summary.  MyChart is used to connect with patients for Virtual Visits (Telemedicine).  Patients are able to view lab/test results, encounter notes, upcoming appointments, etc.  Non-urgent messages can be sent to your provider as well.   To learn more about what you can do with MyChart, go to  [email or web address].    Your next appointment:   1 month(s)  The format for your next appointment:   In Person  Provider:   You may see Debbe Odea, MD or one of the following Advanced Practice Providers on your designated Care Team:   Nicolasa Ducking, NP Eula Listen, PA-C Cadence Fransico Michael, [place]    Other  Instructions

## 2021-08-28 NOTE — Progress Notes (Signed)
Cardiology Office Note:    Date:  08/28/2021   ID:  Haley Mclaughlin, DOB 1966-07-29, MRN [id number]  PCP:  Carney Living, MD   [facility] Providers Cardiologist:  None     Referring MD: Carney Living, *   Chief Complaint  Patient presents with   New Patient (Initial Visit)    Ref by Dr. Deirdre Priest for shortness of breath on exertion. Medications reviewed by the patient verbally. Patient c/p palpitations, shortness of breath on exertion with occasional chest pain at times.     History of Present Illness:    Haley Mclaughlin is a 55 y.o. female with a hx of hypertension, hyperlipidemia, diabetes who presents due to dyspnea on exertion.  Patient has worsening shortness of breath with exertion ongoing over the past 3 months.  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  Also has occasional chest tightness.  She denies smoking.  Has a family history of CAD with mom having MI age 52.  Recent cholesterol check showed severely elevated cholesterol levels.  States parents also had elevated cholesterol.  She was started on Lipitor 80 mg daily, has been taking this for about 1 to 2 weeks now.  Was previously on amlodipine, has some leg edema, amlodipine was stopped.  She states losing some weight, with improvement in diabetic control.  Currently being managed medically, metformin was stopped.  Past Medical History:  Diagnosis Date   Abnormal Pap smear    Atherosclerosis of aorta (HCC) 07/21/2021   Gestational diabetes    diet controlled with #3   Hyperlipemia    Hyperlipidemia    Hypertension    PID (pelvic inflammatory disease)    Preterm labor    with last 4 preg, del at term   Sarcoidosis    Urinary tract infection     Past Surgical History:  Procedure Laterality Date   APPENDECTOMY     INDUCED ABORTION      Current Medications: Current Meds  Medication Sig   atorvastatin (LIPITOR) 80 MG tablet Take 1  tablet (80 mg total) by mouth daily.   hydrocortisone 2.5 % cream Apply topically 2 (two) times daily.   ibuprofen (ADVIL,MOTRIN) 600 MG tablet Take 1 tablet (600 mg total) by mouth every 6 (six) hours as needed.   ivabradine (CORLANOR) 7.5 MG TABS tablet Take 2 tablets (15 mg total) by mouth once for 1 dose. Take 2 hours prior to your CT scan.   losartan (COZAAR) 50 MG tablet Take 1 tablet (50 mg total) by mouth daily.   metoprolol tartrate (LOPRESSOR) 100 MG tablet Take 1 tablet (100 mg total) by mouth once for 1 dose. Take 2 hours prior to your CT scan.   omeprazole (PRILOSEC) 40 MG capsule TAKE 1 CAPSULE BY MOUTH ONCE DAILY AS NEEDED     Allergies:   Sulfamethoxazole-trimethoprim   Social History   Socioeconomic History   Marital status: Married    Spouse name: Not on file   Number of children: Not on file   Years of education: Not on file   Highest education level: Not on file  Occupational History   Not on file  Tobacco Use   Smoking status: Never   Smokeless tobacco: Never  Substance and Sexual Activity   Alcohol use: No   Drug use: No   Sexual activity: Yes    Birth control/protection: Condom    Comment: IUD removed 06/17/2011  Other  Topics Concern   Not on file  Social History Narrative   Not on file   Social Determinants of Health   Financial Resource Strain: Not on file  Food Insecurity: Not on file  Transportation Needs: Not on file  Physical Activity: Not on file  Stress: Not on file  Social Connections: Not on file     Family History: The patient's family history includes Diabetes in her father; Heart disease in her father and mother; Hypertension in her mother. There is no history of Anesthesia problems.  ROS:   Please see the history of present illness.     All other systems reviewed and are negative.  EKGs/Labs/Other Studies Reviewed:    The following studies were reviewed today:   EKG:  EKG is  ordered today.  The ekg ordered today  demonstrates normal sinus rhythm  Recent Labs: 04/06/2021: ALT 20 07/03/2021: BUN 9; Creatinine, Ser 0.67; Hemoglobin 13.4; Platelets 309; Potassium 4.6; Sodium 141  Recent Lipid Panel    Component Value Date/Time   CHOL 371 (H) 07/03/2021 1215   TRIG 123 07/03/2021 1215   HDL 37 (L) 07/03/2021 1215   CHOLHDL 10.0 (H) 07/03/2021 1215   CHOLHDL 9.9 01/09/2015 1040   VLDL 28 01/09/2015 1040   LDLCALC 311 (H) 07/03/2021 1215     Risk Assessment/Calculations:          Physical Exam:    VS:  BP (!) 160/110 (BP Location: Left Arm, Patient Position: Sitting, Cuff Size: Large)    Pulse 98    Ht  (1.626 m)    Wt 214 lb 6 oz (97.2 kg)    SpO2 98%    BMI 36.80 kg/m     Wt Readings from Last 3 Encounters:  08/28/21 214 lb 6 oz (97.2 kg)  08/13/21 215 lb 3.2 oz (97.6 kg)  07/03/21 213 lb 3.2 oz (96.7 kg)     GEN:  Well nourished, well developed in no acute distress HEENT: Normal NECK: No JVD; No carotid bruits LYMPHATICS: No lymphadenopathy CARDIAC: RRR, no murmurs, rubs, gallops RESPIRATORY:  Clear to auscultation without rales, wheezing or rhonchi  ABDOMEN: Soft, non-tender, non-distended MUSCULOSKELETAL:  No edema; No deformity  SKIN: Warm and dry NEUROLOGIC:  Alert and oriented x 3 PSYCHIATRIC:  Normal affect   ASSESSMENT:    1. Dyspnea, unspecified type   2. Primary hypertension   3. Pure hypercholesterolemia   4. Precordial pain    PLAN:    In order of problems listed above:  Dyspnea on exertion, occasional chest pain.  Echocardiogram 07/2021 showed normal systolic and diastolic function, EF 60 to 65%.  Risk factors include hypertension, hyperlipidemia, family history of early CAD.  Get coronary CTA to evaluate presence of CAD. Hypertension, BP elevated, patient also diabetic.  Start losartan 50 mg daily. Hypercholesterolemia, may have a genetic component to it.  I agree with Lipitor 80 mg daily.  Follow-up after coronary CTA.       Medication  Adjustments/Labs and Tests Ordered: Current medicines are reviewed at length with the patient today.  Concerns regarding medicines are outlined above.  Orders Placed This Encounter  Procedures   CT CORONARY MORPH W/CTA COR W/SCORE W/CA W/CM &/OR WO/CM   Basic metabolic panel   EKG 12-Lead   Meds ordered this encounter  Medications   losartan (COZAAR) 50 MG tablet    Sig: Take 1 tablet (50 mg total) by mouth daily.    Dispense:  30 tablet    Refill:  3   ivabradine (CORLANOR) 7.5 MG TABS tablet    Sig: Take 2 tablets (15 mg total) by mouth once for 1 dose. Take 2 hours prior to your CT scan.    Dispense:  2 tablet    Refill:  0    Does not need prior auth, patient will pay cash   metoprolol tartrate (LOPRESSOR) 100 MG tablet    Sig: Take 1 tablet (100 mg total) by mouth once for 1 dose. Take 2 hours prior to your CT scan.    Dispense:  1 tablet    Refill:  0    Patient Instructions  Medication Instructions:   Your physician has recommended you make the following change in your medication:    START taking Losartan 50 MG once a day.  *If you need a refill on your cardiac medications before your next appointment, please call your pharmacy*   Lab Work:  BMP will be drawn in office today.  If you have labs (blood work) drawn today and your tests are completely normal, you will receive your results only by: MyChart Message (if you have MyChart) OR A paper copy in the mail If you have any lab test that is abnormal or we need to change your treatment, we will call you to review the results.   Testing/Procedures:  Your cardiac CT will be scheduled at:  [facility] [address] [place], [place] [address] [phone]  Please arrive 15 mins early for check-in and test prep.    Please follow these instructions carefully (unless otherwise directed):    On the Night Before the Test: Be sure to Drink plenty of water. Do  not consume any caffeinated/decaffeinated beverages or chocolate 12 hours prior to your test.    On the Day of the Test: Drink plenty of water until 1 hour prior to the test. Do not eat any food 4 hours prior to the test. You may take your regular medications prior to the test.  Take metoprolol (Lopressor) 100 MG two hours prior to test. Take Ivabradine (Corlanor) 15 MG two hours prior to test. FEMALES- please wear underwire-free bra if available, avoid dresses & tight clothing        After the Test: Drink plenty of water. After receiving IV contrast, you may experience a mild flushed feeling. This is normal. On occasion, you may experience a mild rash up to 24 hours after the test. This is not dangerous. If this occurs, you can take Benadryl 25 mg and increase your fluid intake. If you experience trouble breathing, this can be serious. If it is severe call 911 IMMEDIATELY. If it is mild, please call our office. If you take any of these medications: Glipizide/Metformin, Avandament, Glucavance, please do not take 48 hours after completing test unless otherwise instructed.  Please allow 2-4 weeks for scheduling of routine cardiac CTs. Some insurance companies require a pre-authorization which may delay scheduling of this test.   For non-scheduling related questions, please contact the cardiac imaging nurse navigator should you have any questions/concerns: Rockwell Alexandria, Cardiac Imaging Nurse Navigator Larey Brick, Cardiac Imaging Nurse Navigator [facility] Direct Office Dial: [phone]   For scheduling needs, including cancellations and rescheduling, please call [place], [phone].     Follow-Up: At [facility], you and your health needs are our priority.  As part of our continuing mission to provide you with exceptional heart care, we have created designated Provider Care Teams.  These Care Teams include your primary Cardiologist (physician)  and Advanced Practice Providers (APPs -  Physician Assistants and Nurse Practitioners) who all work together to provide you with the care you need, when you need it.  We recommend signing up for the patient portal called "MyChart".  Sign up information is provided on this After Visit Summary.  MyChart is used to connect with patients for Virtual Visits (Telemedicine).  Patients are able to view lab/test results, encounter notes, upcoming appointments, etc.  Non-urgent messages can be sent to your provider as well.   To learn more about what you can do with MyChart, go to [email or web address].    Your next appointment:   1 month(s)  The format for your next appointment:   In Person  Provider:   You may see Debbe Odea, MD or one of the following Advanced Practice Providers on your designated Care Team:   Nicolasa Ducking, NP Eula Listen, PA-C Cadence Fransico Michael, [place]    Other Instructions     Signed, Debbe Odea, MD  08/28/2021 10:26 AM    [facility]

## 2021-08-29 LAB — BASIC METABOLIC PANEL
BUN/Creatinine Ratio: 16 (ref 9–23)
BUN: 11 mg/dL (ref 6–24)
CO2: 24 mmol/L (ref 20–29)
Calcium: 9.3 mg/dL (ref 8.7–10.2)
Chloride: 106 mmol/L (ref 96–106)
Creatinine, Ser: 0.69 mg/dL (ref 0.57–1.00)
Glucose: 150 mg/dL — ABNORMAL HIGH (ref 70–99)
Potassium: 4.2 mmol/L (ref 3.5–5.2)
Sodium: 140 mmol/L (ref 134–144)
eGFR: 103 mL/min/{1.73_m2} (ref >59)

## 2021-09-01 ENCOUNTER — Other Ambulatory Visit: Payer: Self-pay | Admitting: Family Medicine

## 2021-09-01 DIAGNOSIS — I10 Essential (primary) hypertension: Secondary | ICD-10-CM

## 2021-09-01 LAB — COLOGUARD: COLOGUARD: NEGATIVE

## 2021-09-10 ENCOUNTER — Telehealth (HOSPITAL_COMMUNITY): Payer: Self-pay | Admitting: Emergency Medicine

## 2021-09-10 NOTE — Telephone Encounter (Signed)
Reaching out to patient to offer assistance regarding upcoming cardiac imaging study; pt verbalizes understanding of appt date/time, parking situation and where to check in, pre-test NPO status and medications ordered, and verified current allergies; name and call back number provided for further questions should they arise Rockwell Alexandria RN Navigator Cardiac Imaging [facility] [phone] office [phone] cell  Denies iv issues 100mg  metoprolol + 15mg  ivab  Arrive 11a

## 2021-09-11 ENCOUNTER — Telehealth: Payer: Self-pay

## 2021-09-11 ENCOUNTER — Other Ambulatory Visit: Payer: Self-pay

## 2021-09-11 ENCOUNTER — Ambulatory Visit
Admission: RE | Admit: 2021-09-11 | Discharge: 2021-09-11 | Disposition: A | Discharge status: Home / Self Care | Payer: Medicaid Other | Source: Ambulatory Visit | Attending: Cardiology | Admitting: Cardiology

## 2021-09-11 DIAGNOSIS — R072 Precordial pain: Secondary | ICD-10-CM | POA: Insufficient documentation

## 2021-09-11 MED ORDER — NITROGLYCERIN 0.4 MG SL SUBL: 0.8000 mg | SUBLINGUAL_TABLET | Freq: Once | SUBLINGUAL | Status: DC

## 2021-09-11 MED ORDER — DILTIAZEM HCL 25 MG/5ML IV SOLN
15.0000 mg | Freq: Once | INTRAVENOUS | Status: AC
  Administered 2021-09-11: 15 mg via INTRAVENOUS

## 2021-09-11 MED ORDER — DILTIAZEM HCL 25 MG/5ML IV SOLN
10.0000 mg | Freq: Once | INTRAVENOUS | Status: AC
  Administered 2021-09-11: 10 mg via INTRAVENOUS

## 2021-09-11 NOTE — Telephone Encounter (Signed)
Spoke with Dr. Azucena Cecil and he ordered a Myoview for this patient as her HR was too high to complete her CCTA today.   I called patient and reviewed the instructions with her, as well as sent her a copy to her MyChart. Patient verbalized understanding and agreed with plan.  Patient is aware that the front desk will be calling her to schedule this.

## 2021-09-11 NOTE — Progress Notes (Signed)
Dr Myriam Forehand called and made aware of the ongoing elevated heart rate of the pt despite cardiazem administration. Pt informed that Dr  Keith Rake nurse would be reaching out to her to reschedule for an alternate test. Pt tolerated the medications well, pt stated her anxiety as the main factor in her heart rate.

## 2021-09-11 NOTE — Telephone Encounter (Signed)
Scheduled

## 2021-09-12 ENCOUNTER — Encounter: Payer: Self-pay | Admitting: Cardiology

## 2021-09-15 ENCOUNTER — Other Ambulatory Visit: Payer: Self-pay

## 2021-09-15 DIAGNOSIS — R079 Chest pain, unspecified: Secondary | ICD-10-CM

## 2021-09-15 NOTE — Progress Notes (Signed)
Scheduled

## 2021-09-15 NOTE — Progress Notes (Unsigned)
Patient requested to change Myoview to an exercise treadmill.  Will route to Scheduling to cancel Myoview and schedule GXT.

## 2021-09-17 ENCOUNTER — Ambulatory Visit: Payer: Medicaid Other

## 2021-09-22 ENCOUNTER — Telehealth: Payer: Self-pay | Admitting: *Deleted

## 2021-09-22 ENCOUNTER — Other Ambulatory Visit: Payer: Medicaid Other

## 2021-09-22 NOTE — Telephone Encounter (Signed)
Spoke with pt and reviewed instructions below for ETT scheduled tomorrow.  Pt aware to arrive 15 min prior to appointment.  Attestation form has been completed.  Pt has no further questions at this time.   Treadmill stress test scheduled 09/23/21 at 9:00 AM  - you may eat a light breakfast/ lunch prior to your procedure - no caffeine for 24 hours prior to your test (coffee, tea, soft drinks, or chocolate)  - no smoking/ vaping for 4 hours prior to your test - you may take your regular medications the day of your test  - bring any inhalers with you to your test - wear comfortable clothing & tennis/ non-skid shoes to walk on the treadmill

## 2021-09-23 ENCOUNTER — Ambulatory Visit (INDEPENDENT_AMBULATORY_CARE_PROVIDER_SITE_OTHER): Payer: Medicaid Other

## 2021-09-23 ENCOUNTER — Other Ambulatory Visit: Payer: Self-pay

## 2021-09-23 DIAGNOSIS — R079 Chest pain, unspecified: Secondary | ICD-10-CM | POA: Diagnosis not present

## 2021-09-26 LAB — EXERCISE TOLERANCE TEST
Angina Index: 0
Base ST Depression (mm): 0 mm
Duke Treadmill Score: 6
Estimated workload: 7.6
Exercise duration (min): 6 min
Exercise duration (sec): 26 s
MPHR: 166 {beats}/min
Peak HR: 179 {beats}/min
Percent HR: 107 %
RPE: 15
Rest HR: 100 {beats}/min
ST Depression (mm): 0 mm

## 2021-09-29 ENCOUNTER — Ambulatory Visit: Payer: Medicaid Other | Admitting: Cardiology

## 2021-09-29 ENCOUNTER — Encounter: Payer: Self-pay | Admitting: Cardiology

## 2021-09-29 ENCOUNTER — Other Ambulatory Visit: Payer: Self-pay

## 2021-09-29 VITALS — BP 110/90 | HR 102 | Ht 64.0 in | Wt 209.0 lb

## 2021-09-29 DIAGNOSIS — Z6835 Body mass index (BMI) 35.0-35.9, adult: Secondary | ICD-10-CM | POA: Diagnosis not present

## 2021-09-29 DIAGNOSIS — I10 Essential (primary) hypertension: Secondary | ICD-10-CM | POA: Diagnosis not present

## 2021-09-29 DIAGNOSIS — R0600 Dyspnea, unspecified: Secondary | ICD-10-CM

## 2021-09-29 DIAGNOSIS — E7800 Pure hypercholesterolemia, unspecified: Secondary | ICD-10-CM

## 2021-09-29 NOTE — Patient Instructions (Signed)
Medication Instructions:  Your physician recommends that you continue on your current medications as directed. Please refer to the Current Medication list given to you today.  *If you need a refill on your cardiac medications before your next appointment, please call your pharmacy*   Lab Work:  Your physician recommends that you return for a FASTING lipid profile: The week prior to your 3 month follow up.  - You will need to be fasting. Please do not have anything to eat or drink after midnight the morning you have the lab work. You may only have water or black coffee with no cream or sugar.   We will call you closer to your appointment time and schedule you in our office for this lab draw.    Testing/Procedures: None Ordered   Follow-Up: At [facility], you and your health needs are our priority.  As part of our continuing mission to provide you with exceptional heart care, we have created designated Provider Care Teams.  These Care Teams include your primary Cardiologist (physician) and Advanced Practice Providers (APPs -  Physician Assistants and Nurse Practitioners) who all work together to provide you with the care you need, when you need it.  We recommend signing up for the patient portal called "MyChart".  Sign up information is provided on this After Visit Summary.  MyChart is used to connect with patients for Virtual Visits (Telemedicine).  Patients are able to view lab/test results, encounter notes, upcoming appointments, etc.  Non-urgent messages can be sent to your provider as well.   To learn more about what you can do with MyChart, go to [email or web address].    Your next appointment:   6 month(s)  The format for your next appointment:   In Person  Provider:   You may see Kate Sable, MD or one of the following Advanced Practice Providers on your designated Care Team:   Murray Hodgkins, NP Christell Faith, PA-C Cadence Kathlen Mody, [place]    Other  Instructions

## 2021-09-29 NOTE — Progress Notes (Signed)
Cardiology Office Note:    Date:  09/29/2021   ID:  Haley Mclaughlin, DOB Aug 08, 1966, MRN [id number]  PCP:  Carney Livinghambliss, Marshall L, MD   [facility] Providers Cardiologist:  None     Referring MD: Carney Livinghambliss, Marshall L, *   Chief Complaint  Patient presents with   Follow-up    F/U after cardiac CTA    History of Present Illness:    Haley DoomWendy D Mclaughlin is a 56 y.o. female with a hx of hypertension, hyperlipidemia, diabetes who presents for follow-up.  Previously seen due to dyspnea on exertion.  Due to risk factors, coronary CTA was ordered but heart rates were too elevated.  Lexiscan Myoview was recommended but patient declined.  Exercise treadmill test was performed.  BP was elevated, losartan 50 mg daily was started.  Tolerating Lipitor 80 mg daily for hyperlipidemia.  She states feeling much better since starting BP medication.  Able to exert herself more with less shortness of breath.  Prior notes Echo 07/2021 EF 60 to 65%, normal diastolic function Has a family history of CAD with mom having MI age 55.     Past Medical History:  Diagnosis Date   Abnormal Pap smear    Atherosclerosis of aorta (HCC) 07/21/2021   Gestational diabetes    diet controlled with #3   Hyperlipemia    Hyperlipidemia    Hypertension    PID (pelvic inflammatory disease)    Preterm labor    with last 4 preg, del at term   Sarcoidosis    Urinary tract infection     Past Surgical History:  Procedure Laterality Date   APPENDECTOMY     INDUCED ABORTION      Current Medications: Current Meds  Medication Sig   atorvastatin (LIPITOR) 80 MG tablet Take 1 tablet (80 mg total) by mouth daily.   hydrocortisone 2.5 % cream Apply topically 2 (two) times daily.   ibuprofen (ADVIL,MOTRIN) 600 MG tablet Take 1 tablet (600 mg total) by mouth every 6 (six) hours as needed.   losartan (COZAAR) 50 MG tablet Take 1 tablet (50 mg total) by mouth daily.   omeprazole (PRILOSEC) 40 MG capsule TAKE 1 CAPSULE BY  MOUTH ONCE DAILY AS NEEDED     Allergies:   Sulfamethoxazole-trimethoprim   Social History   Socioeconomic History   Marital status: Married    Spouse name: Not on file   Number of children: Not on file   Years of education: Not on file   Highest education level: Not on file  Occupational History   Not on file  Tobacco Use   Smoking status: Never   Smokeless tobacco: Never  Vaping Use   Vaping Use: Never used  Substance and Sexual Activity   Alcohol use: No   Drug use: No   Sexual activity: Yes    Birth control/protection: Condom    Comment: IUD removed 06/17/2011  Other Topics Concern   Not on file  Social History Narrative   Not on file   Social Determinants of Health   Financial Resource Strain: Not on file  Food Insecurity: Not on file  Transportation Needs: Not on file  Physical Activity: Not on file  Stress: Not on file  Social Connections: Not on file     Family History: The patient's family history includes Diabetes in her father; Heart disease in her father and mother; Hypertension in her mother and sister. There is no history of Anesthesia problems.  ROS:   Please see  the history of present illness.     All other systems reviewed and are negative.  EKGs/Labs/Other Studies Reviewed:    The following studies were reviewed today:   EKG:  EKG not ordered today.    Recent Labs: 04/06/2021: ALT 20 07/03/2021: Hemoglobin 13.4; Platelets 309 08/28/2021: BUN 11; Creatinine, Ser 0.69; Potassium 4.2; Sodium 140  Recent Lipid Panel    Component Value Date/Time   CHOL 371 (H) 07/03/2021 1215   TRIG 123 07/03/2021 1215   HDL 37 (L) 07/03/2021 1215   CHOLHDL 10.0 (H) 07/03/2021 1215   CHOLHDL 9.9 01/09/2015 1040   VLDL 28 01/09/2015 1040   LDLCALC 311 (H) 07/03/2021 1215     Risk Assessment/Calculations:          Physical Exam:    VS:  BP 110/90 (BP Location: Left Arm, Patient Position: Sitting, Cuff Size: Large)    Pulse (!) 102    Ht 5\' 4"   (1.626 m)    Wt 209 lb (94.8 kg)    SpO2 98%    BMI 35.87 kg/m     Wt Readings from Last 3 Encounters:  09/29/21 209 lb (94.8 kg)  08/28/21 214 lb 6 oz (97.2 kg)  08/13/21 215 lb 3.2 oz (97.6 kg)     GEN:  Well nourished, well developed in no acute distress HEENT: Normal NECK: No JVD; No carotid bruits LYMPHATICS: No lymphadenopathy CARDIAC: RRR, no murmurs, rubs, gallops RESPIRATORY:  Clear to auscultation without rales, wheezing or rhonchi  ABDOMEN: Soft, non-tender, non-distended MUSCULOSKELETAL:  No edema; No deformity  SKIN: Warm and dry NEUROLOGIC:  Alert and oriented x 3 PSYCHIATRIC:  Normal affect   ASSESSMENT:    1. Dyspnea, unspecified type   2. Primary hypertension   3. Pure hypercholesterolemia   4. BMI 35.0-35.9,adult     PLAN:    In order of problems listed above:  Dyspnea on exertion, occasional chest pain.  Echocardiogram 07/2021 showed normal systolic and diastolic function, EF 60 to 65%.  Patient declined Myoview, heart rate too elevated for CCTA.  ETT showed no inducible ischemia.  Obesity/deconditioning likely contributing to shortness of breath.  Weight loss, regular exercise advised. Hypertension, BP now controlled.  Continue losartan 50 mg daily. Hypercholesterolemia, continue Lipitor 80 mg daily.  Repeat fasting lipid profile in 3 months. Obesity, patient congratulated and encouraged on losing weight.  Advised to continue low-calorie diet.  Follow-up in 6 months.       Medication Adjustments/Labs and Tests Ordered: Current medicines are reviewed at length with the patient today.  Concerns regarding medicines are outlined above.  Orders Placed This Encounter  Procedures   Lipid panel   No orders of the defined types were placed in this encounter.   Patient Instructions  Medication Instructions:  Your physician recommends that you continue on your current medications as directed. Please refer to the Current Medication list given to you  today.  *If you need a refill on your cardiac medications before your next appointment, please call your pharmacy*   Lab Work:  Your physician recommends that you return for a FASTING lipid profile: The week prior to your 3 month follow up.  - You will need to be fasting. Please do not have anything to eat or drink after midnight the morning you have the lab work. You may only have water or black coffee with no cream or sugar.   We will call you closer to your appointment time and schedule you in our office for  this lab draw.    Testing/Procedures: None Ordered   Follow-Up: At [facility], you and your health needs are our priority.  As part of our continuing mission to provide you with exceptional heart care, we have created designated Provider Care Teams.  These Care Teams include your primary Cardiologist (physician) and Advanced Practice Providers (APPs -  Physician Assistants and Nurse Practitioners) who all work together to provide you with the care you need, when you need it.  We recommend signing up for the patient portal called "MyChart".  Sign up information is provided on this After Visit Summary.  MyChart is used to connect with patients for Virtual Visits (Telemedicine).  Patients are able to view lab/test results, encounter notes, upcoming appointments, etc.  Non-urgent messages can be sent to your provider as well.   To learn more about what you can do with MyChart, go to [email or web address].    Your next appointment:   6 month(s)  The format for your next appointment:   In Person  Provider:   You may see Debbe Odea, MD or one of the following Advanced Practice Providers on your designated Care Team:   Nicolasa Ducking, NP Eula Listen, PA-C Cadence Fransico Michael, [place]    Other Instructions     Signed, Debbe Odea, MD  09/29/2021 10:47 AM    [facility]

## 2021-10-04 ENCOUNTER — Encounter: Payer: Self-pay | Admitting: Family Medicine

## 2021-10-06 MED ORDER — OMEPRAZOLE 40 MG PO CPDR: 40.0000 mg | DELAYED_RELEASE_CAPSULE | Freq: Every day | ORAL | 2 refills | Status: DC | PRN

## 2021-10-06 MED ORDER — HYDROCORTISONE 2.5 % EX CREA: TOPICAL_CREAM | Freq: Two times a day (BID) | CUTANEOUS | 2 refills | Status: AC

## 2021-12-04 ENCOUNTER — Ambulatory Visit (INDEPENDENT_AMBULATORY_CARE_PROVIDER_SITE_OTHER): Payer: Medicaid Other

## 2021-12-04 ENCOUNTER — Ambulatory Visit: Payer: Medicaid Other | Admitting: Podiatry

## 2021-12-04 DIAGNOSIS — S93521A Sprain of metatarsophalangeal joint of right great toe, initial encounter: Secondary | ICD-10-CM

## 2021-12-04 NOTE — Progress Notes (Signed)
?Subjective:  ?Patient ID: Haley Mclaughlin, female    DOB: [birth year],  MRN: [id number] ? ?No chief complaint on file. ? ? ?56 y.o. female presents with the above complaint.  Patient presents with complaint of right first MPJ sprain and pain.  Patient states been going from a week and is progressive gotten worse.  She was getting off of work and stepped off a curb and sprained it.  She states that it is painful to walk on has progressed gotten worse as a lead.  She wanted get it evaluated.  She denies seeing anyone else prior to seeing me for this.  Pain scale 7 out of 10 hurts with ambulation ? ? ?Review of Systems: Negative except as noted in the HPI. Denies N/V/F/Ch. ? ?Past Medical History:  ?Diagnosis Date  ? Abnormal Pap smear   ? Atherosclerosis of aorta (HCC) 07/21/2021  ? Gestational diabetes   ? diet controlled with #3  ? Hyperlipemia   ? Hyperlipidemia   ? Hypertension   ? PID (pelvic inflammatory disease)   ? Preterm labor   ? with last 4 preg, del at term  ? Sarcoidosis   ? Urinary tract infection   ? ? ?Current Outpatient Medications:  ?  atorvastatin (LIPITOR) 80 MG tablet, Take 1 tablet (80 mg total) by mouth daily., Disp: 90 tablet, Rfl: 3 ?  hydrocortisone 2.5 % cream, Apply topically 2 (two) times daily., Disp: 30 g, Rfl: 2 ?  ibuprofen (ADVIL,MOTRIN) 600 MG tablet, Take 1 tablet (600 mg total) by mouth every 6 (six) hours as needed., Disp: 20 tablet, Rfl: 0 ?  losartan (COZAAR) 50 MG tablet, Take 1 tablet (50 mg total) by mouth daily., Disp: 30 tablet, Rfl: 3 ?  omeprazole (PRILOSEC) 40 MG capsule, Take 1 capsule (40 mg total) by mouth daily as needed., Disp: 90 capsule, Rfl: 2 ? ?Social History  ? ?Tobacco Use  ?Smoking Status Never  ?Smokeless Tobacco Never  ? ? ?Allergies  ?Allergen Reactions  ? Sulfamethoxazole-Trimethoprim Rash and Other (See Comments)  ?  "Feels shaky" also ?"Feels shaky" ?Other reaction(s): Other (See Comments) ?"Feels shaky" ?"Feels shaky" also ?"Feels shaky" ?Other  reaction(s): Other (See Comments) ?"Feels shaky" ?"Feels shaky" also ?"Feels shaky" ?Other reaction(s): Other (See Comments) ?"Feels shaky" ?"Feels shaky" ?Other reaction(s): Other (See Comments) ?"Feels shaky" ?"Feels shaky" also ?  ? ?Objective:  ?There were no vitals filed for this visit. ?There is no height or weight on file to calculate BMI. ?Constitutional Well developed. ?Well nourished.  ?Vascular Dorsalis pedis pulses palpable bilaterally. ?Posterior tibial pulses palpable bilaterally. ?Capillary refill normal to all digits.  ?No cyanosis or clubbing noted. ?Pedal hair growth normal.  ?Neurologic Normal speech. ?Oriented to person, place, and time. ?Epicritic sensation to light touch grossly present bilaterally.  ?Dermatologic Nails well groomed and normal in appearance. ?No open wounds. ?No skin lesions.  ?Orthopedic: First MPJ sprain noted.  Pain with aggressive dorsiflexion of the MPJ joint.  Mild pain at the sesamoidal complex.  Deep intra-articular first MPJ pain noted as well.  No pain at the second metatarsophalangeal joint.  ? ?Radiographs: 3 views of skeletally mature adult right foot: Bunion deformity noted moderate to severe to the right foot.  No acute fractures noted.  No bony abnormalities identified.  Pes planovalgus foot structure noted plantar heel spurring noted.  Mild elevatus of the first ray noted ?Assessment:  ? ?1. Sprain of metatarsophalangeal joint of right great toe, initial encounter   ? ?Plan:  ?Patient  was evaluated and treated and all questions answered. ? ?Right first MPJ sprain ?-All questions and concerns were discussed with the patient in extensive detail ?-I discussed with the patient she may have sprained the ligaments and the tendons that spanned the first MPJ joint.  She may have experienced hyperflexion injury.  I discussed that she will benefit from cam boot immobilization takes 4 to 6 weeks to Mclaughlin from it.  She states understanding would like to be immobilized in  the cam boot ?-Cam boot was dispensed ? ?No follow-ups on file.  ?

## 2021-12-10 ENCOUNTER — Other Ambulatory Visit (INDEPENDENT_AMBULATORY_CARE_PROVIDER_SITE_OTHER): Payer: Medicaid Other

## 2021-12-10 DIAGNOSIS — E7800 Pure hypercholesterolemia, unspecified: Secondary | ICD-10-CM

## 2021-12-11 LAB — LIPID PANEL
Chol/HDL Ratio: 5.9 ratio — ABNORMAL HIGH (ref 0.0–4.4)
Cholesterol, Total: 237 mg/dL — ABNORMAL HIGH (ref 100–199)
HDL: 40 mg/dL (ref >39)
LDL Chol Calc (NIH): 180 mg/dL — ABNORMAL HIGH (ref 0–99)
Triglycerides: 93 mg/dL (ref 0–149)
VLDL Cholesterol Cal: 17 mg/dL (ref 5–40)

## 2021-12-12 ENCOUNTER — Telehealth: Payer: Self-pay

## 2021-12-12 NOTE — Telephone Encounter (Signed)
-----   Message from Brian Agbor-Etang, MD sent at 12/12/2021  8:37 AM EDT ----- ?Cholesterol improved but not  controlled.  Start PCSK9, Repatha or Praluent if insurance approves. ?

## 2021-12-15 MED ORDER — REPATHA SURECLICK 140 MG/ML ~~LOC~~ SOAJ: 1.0000 "pen " | SUBCUTANEOUS | 11 refills | Status: DC

## 2021-12-15 NOTE — Telephone Encounter (Signed)
Called patient and reviewed result note. Patient verbalized understanding and agreed with plan. 

## 2021-12-15 NOTE — Telephone Encounter (Signed)
-----   Message from Debbe Odea, MD sent at 12/12/2021  8:37 AM EDT ----- ?Cholesterol improved but not  controlled.  Start PCSK9, Repatha or Praluent if insurance approves. ?

## 2021-12-16 ENCOUNTER — Telehealth: Payer: Self-pay | Admitting: *Deleted

## 2021-12-16 NOTE — Telephone Encounter (Signed)
Repatha was approved.  ? ?Approval dates 12/16/2021 through 12/16/2022 ?

## 2021-12-16 NOTE — Telephone Encounter (Signed)
PA required for Repatha 140 mg/ml inj. ?PA submitted via covermymeds. ?Awaiting approval. ?

## 2021-12-16 NOTE — Telephone Encounter (Signed)
Please wait for [facility] [place] to return a determination. ?

## 2022-01-01 ENCOUNTER — Ambulatory Visit: Payer: Medicaid Other | Admitting: Podiatry

## 2022-01-07 ENCOUNTER — Encounter: Payer: Self-pay | Admitting: Cardiology

## 2022-01-13 ENCOUNTER — Other Ambulatory Visit: Payer: Self-pay

## 2022-01-13 MED ORDER — LOSARTAN POTASSIUM 50 MG PO TABS: 50.0000 mg | ORAL_TABLET | Freq: Every day | ORAL | 3 refills | Status: DC

## 2022-02-03 ENCOUNTER — Encounter: Payer: Self-pay | Admitting: *Deleted

## 2022-03-17 ENCOUNTER — Ambulatory Visit: Payer: Medicaid Other | Admitting: Family Medicine

## 2022-03-17 ENCOUNTER — Encounter: Payer: Self-pay | Admitting: Family Medicine

## 2022-03-17 ENCOUNTER — Encounter (INDEPENDENT_AMBULATORY_CARE_PROVIDER_SITE_OTHER): Payer: Medicaid Other | Admitting: Cardiology

## 2022-03-17 VITALS — BP 149/109 | HR 95 | Wt 210.4 lb

## 2022-03-17 DIAGNOSIS — E1165 Type 2 diabetes mellitus with hyperglycemia: Secondary | ICD-10-CM | POA: Diagnosis present

## 2022-03-17 DIAGNOSIS — I152 Hypertension secondary to endocrine disorders: Secondary | ICD-10-CM

## 2022-03-17 DIAGNOSIS — E1159 Type 2 diabetes mellitus with other circulatory complications: Secondary | ICD-10-CM | POA: Diagnosis not present

## 2022-03-17 DIAGNOSIS — E7800 Pure hypercholesterolemia, unspecified: Secondary | ICD-10-CM | POA: Diagnosis not present

## 2022-03-17 DIAGNOSIS — R112 Nausea with vomiting, unspecified: Secondary | ICD-10-CM | POA: Insufficient documentation

## 2022-03-17 LAB — POCT GLYCOSYLATED HEMOGLOBIN (HGB A1C): HbA1c, POC (controlled diabetic range): 7.7 % — AB (ref 0.0–7.0)

## 2022-03-17 MED ORDER — ONDANSETRON HCL 4 MG PO TABS: 4.0000 mg | ORAL_TABLET | Freq: Three times a day (TID) | ORAL | 0 refills | Status: DC | PRN

## 2022-03-17 NOTE — Progress Notes (Signed)
    SUBJECTIVE:   CHIEF COMPLAINT / HPI:   GI Symptoms For last two days many episodes of watery diarrhea without blood with fever to 102 and nausea but no vomiting or rash or tick exposure or bleeding.  No suspect foods, medications and no sick contacts.    Hypertension  Knows her medications but did not bring in.  Usually no lightheadness but does have mild now with current illnes  Diabetes  Diet controlled.  Would rather not be on medications  Cholesterol Daily Lipitor without problems.  Could not tolerate Repatha that she took once in April.    PERTINENT  PMH / PSH: Was not able to see [facility] pulmonology again as has been too long and they arenot taking new patients   OBJECTIVE:   BP (!) 149/109   Pulse 95   Wt 210 lb 6.4 oz (95.4 kg)   SpO2 98%   BMI 36.12 kg/m   Mildly ill appearing Heart - rapid rate and rhythm.  No murmurs, gallops or rubs.    Lungs:  Normal respiratory effort, chest expands symmetrically. Lungs are clear to auscultation, no crackles or wheezes. Abdomen: soft and non-tender without masses, organomegaly or hernias noted.  No guarding or rebound Skin:  Intact without suspicious lesions or rashes Mobility:able to get up and down from exam table without assistance or distress    ASSESSMENT/PLAN:   Hypertension associated with diabetes (HCC) Not at goal but is acutely ill with GI illness. Monitor at home   Type 2 diabetes mellitus with hyperglycemia (HCC) A1c worsened.  Will continue diet and recheck in 3 months.  We briefly discussed GLP1 and SGLT2s   Nausea & vomiting Seems consistent with gastroentritis. Abdomen exam benign less likely focal appendicitis or GB disease.  Hydrate and trial of pepto and zofran.     HYPERCHOLESTEROLEMIA Will check labs.  If not at goal consider change to high dose Crestor and add zetia.  Can also follow up with Cardiology since failed Repatha     Patient Instructions  Good to see you today - Thank you for coming  in  Things we discussed today:  GI Drink very frequently Start peptobismol up to four times a day  Try the zofran and small sips frequently If not better in 3 days or if worsened with pain or bleeding then let me now  Your goal blood pressure is less than 140/90.  Check your blood pressure several times a week.  If regularly higher than this please let me know - either with MyChart or leaving a phone message. Next visit please bring in your blood pressure cuff.     I will call you if your tests are not good.  Otherwise, I will send you a message on MyChart (if it is active) or a letter in the mail..  If you do not hear from me with in 2 weeks please call our office.      You need an diabetes eye exam every year.  Please see your eye doctor.  Ask them to fax [place] a report of your exam   You need a Pap smear to prevent cervical cancer.  Please make an appointment.   Please always bring your medication bottles  Come back to see me in 3 months    Carney Living, MD [facility]

## 2022-03-17 NOTE — Assessment & Plan Note (Signed)
Not at goal but is acutely ill with GI illness. Monitor at home

## 2022-03-17 NOTE — Assessment & Plan Note (Signed)
Seems consistent with gastroentritis. Abdomen exam benign less likely focal appendicitis or GB disease.  Hydrate and trial of pepto and zofran.

## 2022-03-17 NOTE — Assessment & Plan Note (Signed)
Will check labs.  If not at goal consider change to high dose Crestor and add zetia.  Can also follow up with Cardiology since failed Repatha

## 2022-03-17 NOTE — Assessment & Plan Note (Signed)
A1c worsened.  Will continue diet and recheck in 3 months.  We briefly discussed GLP1 and SGLT2s

## 2022-03-17 NOTE — Patient Instructions (Addendum)
Good to see you today - Thank you for coming in  Things we discussed today:  GI Drink very frequently Start peptobismol up to four times a day  Try the zofran and small sips frequently If not better in 3 days or if worsened with pain or bleeding then let me now  Your goal blood pressure is less than 140/90.  Check your blood pressure several times a week.  If regularly higher than this please let me know - either with MyChart or leaving a phone message. Next visit please bring in your blood pressure cuff.     I will call you if your tests are not good.  Otherwise, I will send you a message on MyChart (if it is active) or a letter in the mail..  If you do not hear from me with in 2 weeks please call our office.      You need an diabetes eye exam every year.  Please see your eye doctor.  Ask them to fax [place] a report of your exam   You need a Pap smear to prevent cervical cancer.  Please make an appointment.   Please always bring your medication bottles  Come back to see me in 3 months

## 2022-03-18 LAB — LIPID PANEL
Chol/HDL Ratio: 6.8 ratio — ABNORMAL HIGH (ref 0.0–4.4)
Cholesterol, Total: 210 mg/dL — ABNORMAL HIGH (ref 100–199)
HDL: 31 mg/dL — ABNORMAL LOW (ref >39)
LDL Chol Calc (NIH): 161 mg/dL — ABNORMAL HIGH (ref 0–99)
Triglycerides: 101 mg/dL (ref 0–149)
VLDL Cholesterol Cal: 18 mg/dL (ref 5–40)

## 2022-03-20 ENCOUNTER — Other Ambulatory Visit: Payer: Self-pay

## 2022-03-20 MED ORDER — BEMPEDOIC ACID 180 MG PO TABS: 1.0000 | ORAL_TABLET | Freq: Every day | ORAL | 3 refills | Status: DC

## 2022-03-20 MED ORDER — EZETIMIBE 10 MG PO TABS: 10.0000 mg | ORAL_TABLET | Freq: Every day | ORAL | 3 refills | Status: DC

## 2022-03-20 NOTE — Telephone Encounter (Signed)
Please see the MyChart message reply(ies) for my assessment and plan.    This patient gave consent for this Medical Advice Message and is aware that it may result in a bill to their insurance company, as well as the possibility of receiving a bill for a co-payment or deductible. They are an established patient, but are not seeking medical advice exclusively about a problem treated during an in person or video visit in the last seven days. I did not recommend an in person or video visit within seven days of my reply.    I spent a total of 12 minutes cumulative time within 7 days through MyChart messaging.  Sharley Keeler Agbor-Etang, MD   

## 2022-03-23 ENCOUNTER — Telehealth: Payer: Self-pay

## 2022-03-23 NOTE — Telephone Encounter (Signed)
Prior Authorization for Nexletol 180 mg initiated by covermymeds.com KEY: [id number] Response: [facility] has not replied to your PA request. Turnaround time for review of a PA request is dependent upon insurance plan and can range from 24 hours to 5 calendar days.

## 2022-03-23 NOTE — Telephone Encounter (Signed)
Per fax received from [place] [employer] [place] [facility] [place], [place] has been approved from 03/23/22-09/19/22.

## 2022-03-26 ENCOUNTER — Encounter: Payer: Self-pay | Admitting: Family Medicine

## 2022-03-26 DIAGNOSIS — E7800 Pure hypercholesterolemia, unspecified: Secondary | ICD-10-CM

## 2022-04-13 ENCOUNTER — Ambulatory Visit: Payer: Medicaid Other | Admitting: Medical

## 2022-04-13 ENCOUNTER — Encounter: Payer: Self-pay | Admitting: Medical

## 2022-04-13 VITALS — BP 152/92 | HR 87 | Ht 64.0 in | Wt 213.2 lb

## 2022-04-13 DIAGNOSIS — E7800 Pure hypercholesterolemia, unspecified: Secondary | ICD-10-CM

## 2022-04-13 DIAGNOSIS — R079 Chest pain, unspecified: Secondary | ICD-10-CM

## 2022-04-13 DIAGNOSIS — Z6835 Body mass index (BMI) 35.0-35.9, adult: Secondary | ICD-10-CM

## 2022-04-13 DIAGNOSIS — R0600 Dyspnea, unspecified: Secondary | ICD-10-CM

## 2022-04-13 DIAGNOSIS — I10 Essential (primary) hypertension: Secondary | ICD-10-CM

## 2022-04-13 MED ORDER — LOSARTAN POTASSIUM 100 MG PO TABS: 100.0000 mg | ORAL_TABLET | Freq: Every day | ORAL | 1 refills | Status: DC

## 2022-04-13 NOTE — Patient Instructions (Signed)
Medication Instructions:  Your physician has recommended you make the following change in your medication:   INCREASE Losartan 100 mg daily. An Rx has been sent to your pharmacy.   *If you need a refill on your cardiac medications before your next appointment, please call your pharmacy*   Lab Work: None ordered If you have labs (blood work) drawn today and your tests are completely normal, you will receive your results only by: MyChart Message (if you have MyChart) OR A paper copy in the mail If you have any lab test that is abnormal or we need to change your treatment, we will call you to review the results.   Testing/Procedures: None ordered   Follow-Up: At [facility], you and your health needs are our priority.  As part of our continuing mission to provide you with exceptional heart care, we have created designated Provider Care Teams.  These Care Teams include your primary Cardiologist (physician) and Advanced Practice Providers (APPs -  Physician Assistants and Nurse Practitioners) who all work together to provide you with the care you need, when you need it.  We recommend signing up for the patient portal called "MyChart".  Sign up information is provided on this After Visit Summary.  MyChart is used to connect with patients for Virtual Visits (Telemedicine).  Patients are able to view lab/test results, encounter notes, upcoming appointments, etc.  Non-urgent messages can be sent to your provider as well.   To learn more about what you can do with MyChart, go to [email or web address].    Your next appointment:   4 month(s)  The format for your next appointment:   In Person  Provider:   Cadence Fransico Michael, PA-C   Other Instructions N/A  Important Information About Sugar

## 2022-04-13 NOTE — Progress Notes (Signed)
Cardiology Office Note:    Date:  04/13/2022   ID:  Haley Mclaughlin, DOB [birth year], MRN [id number]  PCP:  Carney Living, MD  [facility] Cardiologist:  Debbe Odea, MD  [facility] Electrophysiologist:  None   Referring MD: Carney Living, *   Chief Complaint: 6 month follow-up  History of Present Illness:    Haley Mclaughlin is a 56 y.o. female with a hx of HTN, HLD, and Diabetes who presents for 6 month follow-up.   Due to risk factors, Cardiac CTA was ordered but heart rates were too elevated. Lexsican Myoview was recommended, but patient declined. Echo 07/2021 showed LVEF 60-65%, normal diastolic dysfunction. ETT was performed, which was normal with no ischemia.   Last seen 09/29/21 and was feeling better. BP was better on Losartan.   Today, the patient reports she had a foot injury a month ago, which limited her walking. Before the injury she was walking 3-4 miles. She just recently started walking again and feels more shortness of breath than before. With severe exertion she experiences some chest fullness that goes up into her neck. Feels it may shut her throat off. It occurs when her heart rates starts climbing. No LLE, orthopnea, pnd. Occasional dizziness and lightheadedness. BP is higher today, she did have medication today. At home it's 140/90. Repeat BP 142/88. She has an appointment with a  nutritionist at the beginning at September.    Past Medical History:  Diagnosis Date   Abnormal Pap smear    Atherosclerosis of aorta (HCC) 07/21/2021   Gestational diabetes    diet controlled with #3   Hyperlipemia    Hyperlipidemia    Hypertension    PID (pelvic inflammatory disease)    Preterm labor    with last 4 preg, del at term   Sarcoidosis    Urinary tract infection     Past Surgical History:  Procedure Laterality Date   APPENDECTOMY     INDUCED ABORTION      Current Medications: Current Meds  Medication Sig   atorvastatin (LIPITOR)  80 MG tablet Take 1 tablet (80 mg total) by mouth daily.   hydrocortisone 2.5 % cream Apply topically 2 (two) times daily.   omeprazole (PRILOSEC) 40 MG capsule Take 1 capsule (40 mg total) by mouth daily as needed.   [DISCONTINUED] losartan (COZAAR) 50 MG tablet Take 1 tablet (50 mg total) by mouth daily.     Allergies:   Sulfamethoxazole-trimethoprim   Social History   Socioeconomic History   Marital status: Married    Spouse name: Not on file   Number of children: Not on file   Years of education: Not on file   Highest education level: Not on file  Occupational History   Not on file  Tobacco Use   Smoking status: Never   Smokeless tobacco: Never  Vaping Use   Vaping Use: Never used  Substance and Sexual Activity   Alcohol use: No   Drug use: No   Sexual activity: Yes    Birth control/protection: Condom    Comment: IUD removed 06/17/2011  Other Topics Concern   Not on file  Social History Narrative   Not on file   Social Determinants of Health   Financial Resource Strain: Not on file  Food Insecurity: Not on file  Transportation Needs: Not on file  Physical Activity: Not on file  Stress: Not on file  Social Connections: Not on file     Family History:  The patient's family history includes Diabetes in her father; Heart disease in her father and mother; Hypertension in her mother and sister. There is no history of Anesthesia problems.  ROS:   Please see the history of present illness.     All other systems reviewed and are negative.  EKGs/Labs/Other Studies Reviewed:    The following studies were reviewed today:  Echo 07/2021  1. Left ventricular ejection fraction, by estimation, is 60 to 65%. The  left ventricle has normal function. The left ventricle has no regional  wall motion abnormalities. There is mild left ventricular hypertrophy.  Left ventricular diastolic parameters  were normal.   2. Right ventricular systolic function is normal. The right  ventricular  size is normal. There is normal pulmonary artery systolic pressure. The  estimated right ventricular systolic pressure is 23.4 mmHg.   3. The mitral valve is normal in structure. Trivial mitral valve  regurgitation. No evidence of mitral stenosis.   4. The aortic valve is tricuspid. Aortic valve regurgitation is not  visualized. No aortic stenosis is present.   5. The inferior vena cava is normal in size with greater than 50%  respiratory variability, suggesting right atrial pressure of 3 mmHg.   ETT 08/2021   No ST deviation was noted.   Normal stress test   No evidence for inducible ischemia  EKG:  EKG is ordered today.  The ekg ordered today demonstrates NSR 87bpm, PAC, PVC, low voltage, nonspecific T wave changes  Recent Labs: 07/03/2021: Hemoglobin 13.4; Platelets 309 08/28/2021: BUN 11; Creatinine, Ser 0.69; Potassium 4.2; Sodium 140  Recent Lipid Panel    Component Value Date/Time   CHOL 210 (H) 03/17/2022 1427   TRIG 101 03/17/2022 1427   HDL 31 (L) 03/17/2022 1427   CHOLHDL 6.8 (H) 03/17/2022 1427   CHOLHDL 9.9 01/09/2015 1040   VLDL 28 01/09/2015 1040   LDLCALC 161 (H) 03/17/2022 1427    Physical Exam:    VS:  BP (!) 152/92 (BP Location: Left Arm, Patient Position: Sitting, Cuff Size: Large)   Pulse 87   Ht 5\' 4"  (1.626 m)   Wt 213 lb 3.2 oz (96.7 kg)   SpO2 95%   BMI 36.60 kg/m     Wt Readings from Last 3 Encounters:  04/13/22 213 lb 3.2 oz (96.7 kg)  03/17/22 210 lb 6.4 oz (95.4 kg)  09/29/21 209 lb (94.8 kg)     GEN:  Well nourished, well developed in no acute distress HEENT: Normal NECK: No JVD; No carotid bruits LYMPHATICS: No lymphadenopathy CARDIAC: RRR, no murmurs, rubs, gallops RESPIRATORY:  Clear to auscultation without rales, wheezing or rhonchi  ABDOMEN: Soft, non-tender, non-distended MUSCULOSKELETAL:  No edema; No deformity  SKIN: Warm and dry NEUROLOGIC:  Alert and oriented x 3 PSYCHIATRIC:  Normal affect   ASSESSMENT:     1. Dyspnea, unspecified type   2. Primary hypertension   3. Pure hypercholesterolemia   4. Chest pain of uncertain etiology   5. BMI 35.0-35.9,adult    PLAN:    In order of problems listed above:  DOE/chest fullness Worsening DOE after foot injury a month ago and was unable to walk. She is now back to walking and slowly trying to build up to where she was. She has some chest fullness with extreme exertion, which I suspect is just related to deconditioning. EKG today with no significant changes. Prior ETT showed no ischemic changes. Could not get a Cardiac CTA due to elevated heart rate. Patient  is also working on diet and weight loss. I recommend she continue to increase endurance and we will reevaluate symptoms at follow-up.   HTN BP is mildly elevated today. I will increase Losartan to 100mg  daily.   Hypercholesterolemia Last lipid panel showed TG 101, HDL 31, LDL 161, total chol 210. Continue Lipitor and Zetia. Diet changes discussed.   Obesity Patient is back walking again as above. Plan to see a nutritionist for further recommendations.   Disposition: Follow up in 4 month(s) with MD/APP    Signed, Shareece Bultman , PA-C  04/13/2022 10:45 AM    [facility]

## 2022-05-06 ENCOUNTER — Ambulatory Visit: Payer: Medicaid Other | Admitting: Medical

## 2022-05-22 ENCOUNTER — Encounter: Payer: Self-pay | Admitting: Dietician

## 2022-05-22 ENCOUNTER — Encounter: Payer: Medicaid Other | Attending: Family Medicine | Admitting: Dietician

## 2022-05-22 VITALS — Ht 64.0 in | Wt 211.2 lb

## 2022-05-22 DIAGNOSIS — Z6836 Body mass index (BMI) 36.0-36.9, adult: Secondary | ICD-10-CM | POA: Insufficient documentation

## 2022-05-22 DIAGNOSIS — Z713 Dietary counseling and surveillance: Secondary | ICD-10-CM | POA: Insufficient documentation

## 2022-05-22 DIAGNOSIS — E6609 Other obesity due to excess calories: Secondary | ICD-10-CM | POA: Insufficient documentation

## 2022-05-22 DIAGNOSIS — E1165 Type 2 diabetes mellitus with hyperglycemia: Secondary | ICD-10-CM | POA: Insufficient documentation

## 2022-05-22 DIAGNOSIS — E7800 Pure hypercholesterolemia, unspecified: Secondary | ICD-10-CM | POA: Insufficient documentation

## 2022-05-22 NOTE — Progress Notes (Signed)
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 03/17/22 labs indicate total cholesterol 210, HDL 31, LDL 161, triglycerides 101; HbA1C 7.7% She reports some constipation, has metamucil Patient states she  Food allergies: none Special diet practices: occasional fasting for several hours during a day. Denies any issues with hypoglycemia Energy is often low in part due to sarcoidosis Patient seeks help with improving BG control and weight loss, in hopes of reducing need for medication. She would like help with knowing good options for meals, especially with her occasionally erratic work schedule.    Dietary Intake:  Usual eating pattern includes 2 meals and 1-2 snacks per day. Dining out frequency: 9-11 meals per week. Who plans meals/ buys groceries? self Who prepares meals? self  Breakfast: eats maybe 2x a week, typically takeout food -- egg and cheese occ with bacon on biscuit, no sides Snack: none Lunch: 12:30 if no breakfast -- occ 4p with mother  salad/ meat + veg; when with son --takeout food -- pizza buffet/ mcdonalds or other fast food Snack: sometimes chips Supper: 6:30pm or later -- spaghetti/ tacos/ sandwich -- quick meals Snack: sometimes if hungry handful of snack crackers; pop tart Beverages: diet pepsi 24-40oz daily, trying to increase water but struggling, does not like plain water  Physical activity: walk,  stationary bike 30-60 minutes, 3x a week   Intervention:   Nutrition Care Education:   Basic nutrition: basic food groups; appropriate nutrient balance; appropriate meal and snack schedule; general nutrition guidelines    Weight control: importance of low sugar and low fat choices; portion control; estimated energy needs for weight loss at 1300 kcal, provided guidance for 45% CHO, 25% pro, 30% fat; role of physical activity Diabetes: appropriate meal and snack schedule; appropriate carb intake and balance, healthy carb choices; role of fiber, protein, fat; physical activity; effects of stress Hyperlipidemia: healthy and unhealthy fats; role of fiber, plant sterols; role of exercise Other: importance of adequate fluid intake to manage constipation; role of fiber    Other intervention notes: Patient has been making some changes to reduce carb intake and calories. She is motivated to continue.  Established goals for change with direction from patient   Nutritional Diagnosis:  [place]-2.2 Altered nutrition-related laboratory As related to hyperlipidemia, Type 2 diabetes.  As evidenced by elevated total cholesterol and LDL, low HDL, elevated HbA1C. [place]-3.3 Overweight/obesity As related to history of excess calories, history of inadequate physical activity.  As evidenced by patient with current BMI of 36.25.   Education Materials given:  General diet guidelines for Diabetes and heart health Plate Planner with food lists, sample meal pattern Sample menus Foods to Choose to Lower Cholesterol (AHA) Visit summary with goals/ instructions   Learner/ who was taught:  Patient   Level of understanding: Verbalizes/ demonstrates competency  Demonstrated degree of understanding via:   Teach back Learning barriers: None  Willingness to learn/ readiness for change: Eager, change in progress   Monitoring and Evaluation:  Dietary intake, exercise, BG control and blood lipids, and body weight  follow up:  07/15/22 at 9:15am

## 2022-05-22 NOTE — Patient Instructions (Addendum)
Plan to eat a meal or snack every 3-5 hours during the day. Include a protein food along with one or more other food groups Increase fluids during the day by drinking sugar free flavored water. Keep a drink close by to continuously drink fluids. Goal is at least 64oz of fluids daily. Continue to control the amount of carbohydrate foods, great job reducing sugar and starch intake so far! Keep up some exercise regularly

## 2022-07-14 ENCOUNTER — Other Ambulatory Visit: Payer: Self-pay | Admitting: Family Medicine

## 2022-07-14 DIAGNOSIS — Z1231 Encounter for screening mammogram for malignant neoplasm of breast: Secondary | ICD-10-CM

## 2022-07-15 ENCOUNTER — Encounter: Payer: Medicaid Other | Attending: Family Medicine | Admitting: Dietician

## 2022-07-15 ENCOUNTER — Encounter: Payer: Self-pay | Admitting: Dietician

## 2022-07-15 VITALS — Ht 64.0 in | Wt 210.0 lb

## 2022-07-15 DIAGNOSIS — E1165 Type 2 diabetes mellitus with hyperglycemia: Secondary | ICD-10-CM | POA: Insufficient documentation

## 2022-07-15 DIAGNOSIS — E7800 Pure hypercholesterolemia, unspecified: Secondary | ICD-10-CM | POA: Diagnosis not present

## 2022-07-15 DIAGNOSIS — E6609 Other obesity due to excess calories: Secondary | ICD-10-CM | POA: Insufficient documentation

## 2022-07-15 DIAGNOSIS — Z6836 Body mass index (BMI) 36.0-36.9, adult: Secondary | ICD-10-CM | POA: Diagnosis present

## 2022-07-15 NOTE — Patient Instructions (Addendum)
Try Ocean Spray Diet 5 juice drinks, or Nature's Twist sugar free drinks, Sparkling Ice drinks. Keep carbs to about 30grams with meals, not more than 45grams. (About 150grams per day) Include a protein food and a small amount of starch + a veg and/or a fruit with each meals. Keep looking for suitable options for vegetables and fruits to eat with meals or snacks. Eat larger portions of low carb veggies as often as possible. Keep foods low in fat and sugar (ideally 10g or less per serving) as often as possible. Continue with regular exercise, great job!

## 2022-07-15 NOTE — Progress Notes (Signed)
Medical Nutrition Therapy: Visit start time: 0920  end time: 1010  Assessment:  Diagnosis: hyperlipidemia, Type 2 diabetes, obesity Medical history changes: no changes per patient Psychosocial issues/ stress concerns: none  Medications, supplement changes: no changes   Current weight: 210.0lbs Height: 5'4" BMI: 36.05  Progress and evaluation:  Reports some confusion/ concern over finding acceptable foods that won't affect BG and cholesterol.  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 She sometimes gets takeout food while picking up a meal order, which is mostly chicken tenders or nuggets to avoid excess carbs from bread. She has been reading food labels and avoiding foods with 30g or more of carbohydrates. Next medical appt is 07/2022    Dietary Intake:  Usual eating pattern includes 2-3 meals and 1-2 snacks per day. Dining out frequency: several meals per week.  Breakfast: no takeout; often cheese stick; cheerios + cheese stick; avocado toast Snack: none Lunch: sometimes eats with son; occ cheese stick if larger breakfast Snack: none or cheese stick Supper: busy with meal delivery; sometimes chicken nuggets and mac and cheese; tries to avoid bread; about 2 times a week cooks dinner -- baked/ air-fried meat ie  chicken and potatoes/ tacos/ spaghetti Snack: 10:30 arrives home from working, [employer] hungry and eats mini bag of popcorn or cheese stick Beverages: water at restaurants, reports not drinking much at home, does drink 1-2 bottles water  Physical activity: 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 most 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  Intervention:   Nutrition Care Education:  Basic nutrition: reviewed appropriate nutrient balance; appropriate meal and snack schedule; general nutrition guidelines    Weight control: reviewed progress since previous visit;  tracking food intake; keeping fat and sugar levels low in foods Advanced nutrition: food label reading for carbohydrate Diabetes:  reviewed appropriate carb intake and balance; healthy carb choices for lower glycemic response; suitable beverage options; simple balanced meal and snack options; effects of stress, physical activity Hypertension: high sodium foods, food sources of potassium, magnesium Hyperlipidemia:  reviewed healthy and unhealthy fats; appropriate food choices    Nutritional Diagnosis:  [place]-2.2 Altered nutrition-related laboratory As related to hyperlipidemia, Type 2 diabetes.  As evidenced by elevated total cholesterol and LDL, low HDL, elevated HbA1C. [place]-3.3 Overweight/obesity As related to history of excess calories, history of inadequate physical activity.  As evidenced by patient with current BMI of 36, working on diet and lifestyle changes to promote weight loss and improve health risk.   Education Materials given:  Microbiologist who was taught:  Patient    Level of understanding: Verbalizes/ demonstrates competency   Demonstrated degree of understanding via:   Teach back Learning barriers: None  Willingness to learn/ readiness for change: Eager, change in progress   Monitoring and Evaluation:  Dietary intake, exercise, BG control, blood lipids, and body weight      follow up:  09/16/22 at 11:15am

## 2022-08-12 NOTE — Progress Notes (Signed)
Cardiology Office Note:    Date:  08/14/2022   ID:  Haley Mclaughlin, DOB [birth year], MRN [id number]  PCP:  Carney Living, MD  [facility] Cardiologist:  Debbe Odea, MD  [facility] Electrophysiologist:  None   Referring MD: Carney Living, *   Chief Complaint: 4 month follow-up  History of Present Illness:    Haley Mclaughlin is a 56 y.o. female with a hx of HTN, HLD, and Diabetes who presents for 6 month follow-up.    Due to risk factors, Cardiac CTA was ordered but heart rates were too elevated. Lexsican Myoview was recommended, but patient declined. Echo 07/2021 showed LVEF 60-65%, normal diastolic dysfunction. ETT was performed, which was normal with no ischemia.    Last seen 03/2022 and reported foot injury that was limiting activity. She reported DOE that was felt to be related to deconditioning.   Today, the patient reports foot injury has resolved. She is more active during the day. She does a lot of walking. She does 2 mile hikes on the weekends. She rides stationary bike at least 3o minutes. Breathing has gotten much better. No chest pain.   Past Medical History:  Diagnosis Date   Abnormal Pap smear    Atherosclerosis of aorta (HCC) 07/21/2021   Gestational diabetes    diet controlled with #3   Hyperlipemia    Hyperlipidemia    Hypertension    PID (pelvic inflammatory disease)    Preterm labor    with last 4 preg, del at term   Sarcoidosis    Urinary tract infection     Past Surgical History:  Procedure Laterality Date   APPENDECTOMY     INDUCED ABORTION      Current Medications: Current Meds  Medication Sig   acetaminophen (TYLENOL) 325 MG tablet Take 650 mg by mouth every 6 (six) hours as needed.   atorvastatin (LIPITOR) 80 MG tablet Take 1 tablet (80 mg total) by mouth daily.   ezetimibe (ZETIA) 10 MG tablet Take 1 tablet (10 mg total) by mouth daily.   hydrocortisone 2.5 % cream Apply topically 2 (two) times daily.    losartan (COZAAR) 100 MG tablet Take 1 tablet (100 mg total) by mouth daily.   Multiple Vitamins-Minerals (MULTIVITAMIN ADULTS 50+) TABS Take 1 tablet by mouth daily.   omeprazole (PRILOSEC) 40 MG capsule Take 1 capsule (40 mg total) by mouth daily as needed.     Allergies:   Sulfamethoxazole-trimethoprim   Social History   Socioeconomic History   Marital status: Married    Spouse name: Not on file   Number of children: Not on file   Years of education: Not on file   Highest education level: Not on file  Occupational History   Not on file  Tobacco Use   Smoking status: Never   Smokeless tobacco: Never  Vaping Use   Vaping Use: Never used  Substance and Sexual Activity   Alcohol use: No   Drug use: No   Sexual activity: Yes    Birth control/protection: Condom    Comment: IUD removed 06/17/2011  Other Topics Concern   Not on file  Social History Narrative   Not on file   Social Determinants of Health   Financial Resource Strain: Not on file  Food Insecurity: Not on file  Transportation Needs: Not on file  Physical Activity: Not on file  Stress: Not on file  Social Connections: Not on file     Family History: The  patient's family history includes Diabetes in her father; Heart disease in her father and mother; Hypertension in her mother and sister. There is no history of Anesthesia problems.  ROS:   Please see the history of present illness.     All other systems reviewed and are negative.  EKGs/Labs/Other Studies Reviewed:    The following studies were reviewed today:  Echo 07/2021  1. Left ventricular ejection fraction, by estimation, is 60 to 65%. The  left ventricle has normal function. The left ventricle has no regional  wall motion abnormalities. There is mild left ventricular hypertrophy.  Left ventricular diastolic parameters  were normal.   2. Right ventricular systolic function is normal. The right ventricular  size is normal. There is normal  pulmonary artery systolic pressure. The  estimated right ventricular systolic pressure is 23.4 mmHg.   3. The mitral valve is normal in structure. Trivial mitral valve  regurgitation. No evidence of mitral stenosis.   4. The aortic valve is tricuspid. Aortic valve regurgitation is not  visualized. No aortic stenosis is present.   5. The inferior vena cava is normal in size with greater than 50%  respiratory variability, suggesting right atrial pressure of 3 mmHg.    ETT 08/2021   No ST deviation was noted.   Normal stress test   No evidence for inducible ischemia    EKG:  EKG is ordered today.  The ekg ordered today demonstrates NSR 85bpm, no significant changes  Recent Labs: 08/28/2021: BUN 11; Creatinine, Ser 0.69; Potassium 4.2; Sodium 140  Recent Lipid Panel    Component Value Date/Time   CHOL 210 (H) 03/17/2022 1427   TRIG 101 03/17/2022 1427   HDL 31 (L) 03/17/2022 1427   CHOLHDL 6.8 (H) 03/17/2022 1427   CHOLHDL 9.9 01/09/2015 1040   VLDL 28 01/09/2015 1040   LDLCALC 161 (H) 03/17/2022 1427    Physical Exam:    VS:  BP 120/68 (BP Location: Left Arm, Patient Position: Sitting, Cuff Size: Large)   Pulse 85   Ht 5\' 4"  (1.626 m)   Wt 211 lb 6 oz (95.9 kg)   SpO2 99%   BMI 36.28 kg/m     Wt Readings from Last 3 Encounters:  08/14/22 211 lb 6 oz (95.9 kg)  07/15/22 210 lb (95.3 kg)  05/22/22 211 lb 3.2 oz (95.8 kg)     GEN:  Well nourished, well developed in no acute distress HEENT: Normal NECK: No JVD; No carotid bruits LYMPHATICS: No lymphadenopathy CARDIAC: RRR, no murmurs, rubs, gallops RESPIRATORY:  Clear to auscultation without rales, wheezing or rhonchi  ABDOMEN: Soft, non-tender, non-distended MUSCULOSKELETAL:  No edema; No deformity  SKIN: Warm and dry NEUROLOGIC:  Alert and oriented x 3 PSYCHIATRIC:  Normal affect   ASSESSMENT:    1. Dyspnea, unspecified type   2. Primary hypertension   3. Pure hypercholesterolemia   4. Chest pain of uncertain  etiology   5. BMI 35.0-35.9,adult    PLAN:    In order of problems listed above:  DOE/chest fullness Patient reports resolved chest pain and DOE, suspected it was from deconditioning after her foot injury. Her foot injury has since resolved and she is back exercising. She is biking and walking daily. Prior ETT showed no ischemic changes. No further work-up at this time.  HTN Bp is good today, continue Losartan 100mg  daily.   Hypercholesterolemia Total chol 210, HDL 31, LDL 161, Tg 101. Continue Lipitor and Zetia. We can re-check lipid profile at follow-up.  Obesity Patient is back to increased activity.   Disposition: Follow up in 6 month(s) with MD/APP    Signed, Oliveah Zwack David Stall, PA-C  08/14/2022 10:53 AM    [facility]

## 2022-08-14 ENCOUNTER — Ambulatory Visit: Payer: Medicaid Other | Attending: Medical | Admitting: Medical

## 2022-08-14 ENCOUNTER — Encounter: Payer: Self-pay | Admitting: Medical

## 2022-08-14 VITALS — BP 120/68 | HR 85 | Ht 64.0 in | Wt 211.4 lb

## 2022-08-14 DIAGNOSIS — I10 Essential (primary) hypertension: Secondary | ICD-10-CM | POA: Diagnosis not present

## 2022-08-14 DIAGNOSIS — E7800 Pure hypercholesterolemia, unspecified: Secondary | ICD-10-CM

## 2022-08-14 DIAGNOSIS — Z6835 Body mass index (BMI) 35.0-35.9, adult: Secondary | ICD-10-CM

## 2022-08-14 DIAGNOSIS — R0600 Dyspnea, unspecified: Secondary | ICD-10-CM

## 2022-08-14 DIAGNOSIS — R079 Chest pain, unspecified: Secondary | ICD-10-CM | POA: Diagnosis not present

## 2022-08-14 NOTE — Patient Instructions (Signed)
Medication Instructions:  - Your physician recommends that you continue on your current medications as directed. Please refer to the Current Medication list given to you today. *If you need a refill on your cardiac medications before your next appointment, please call your pharmacy*   Lab Work: - none ordered If you have labs (blood work) drawn today and your tests are completely normal, you will receive your results only by: MyChart Message (if you have MyChart) OR A paper copy in the mail If you have any lab test that is abnormal or we need to change your treatment, we will call you to review the results.   Testing/Procedures: - none ordered   Follow-Up: At [facility], you and your health needs are our priority.  As part of our continuing mission to provide you with exceptional heart care, we have created designated Provider Care Teams.  These Care Teams include your primary Cardiologist (physician) and Advanced Practice Providers (APPs -  Physician Assistants and Nurse Practitioners) who all work together to provide you with the care you need, when you need it.  We recommend signing up for the patient portal called "MyChart".  Sign up information is provided on this After Visit Summary.  MyChart is used to connect with patients for Virtual Visits (Telemedicine).  Patients are able to view lab/test results, encounter notes, upcoming appointments, etc.  Non-urgent messages can be sent to your provider as well.   To learn more about what you can do with MyChart, go to [email or web address].    Your next appointment:   6 month(s)  The format for your next appointment:   In Person  Provider:   You may see Debbe Odea, MD or one of the following Advanced Practice Providers on your designated Care Team:    Cadence Fransico Michael, [place]   Other Instructions N/a  Important Information About Sugar

## 2022-08-20 ENCOUNTER — Ambulatory Visit: Payer: Medicaid Other

## 2022-08-28 ENCOUNTER — Other Ambulatory Visit: Payer: Self-pay | Admitting: Family Medicine

## 2022-09-16 ENCOUNTER — Ambulatory Visit: Payer: Medicaid Other | Admitting: Dietician

## 2022-10-01 ENCOUNTER — Ambulatory Visit: Payer: Medicaid Other | Admitting: Dietician

## 2022-10-04 ENCOUNTER — Other Ambulatory Visit: Payer: Self-pay | Admitting: Medical

## 2022-10-12 ENCOUNTER — Ambulatory Visit: Payer: Medicaid Other

## 2022-10-23 ENCOUNTER — Other Ambulatory Visit: Payer: Self-pay | Admitting: Family Medicine

## 2022-11-04 ENCOUNTER — Encounter: Payer: Self-pay | Admitting: Dietician

## 2022-11-04 NOTE — Progress Notes (Signed)
Have not heard from patient to reschedule her missed appointment from 10/01/22. Sent notification to referring provider.

## 2022-11-24 ENCOUNTER — Ambulatory Visit
Admission: RE | Admit: 2022-11-24 | Discharge: 2022-11-24 | Disposition: A | Discharge status: Home / Self Care | Payer: Medicaid Other | Source: Ambulatory Visit | Attending: Family Medicine | Admitting: Family Medicine

## 2022-11-24 DIAGNOSIS — Z1231 Encounter for screening mammogram for malignant neoplasm of breast: Secondary | ICD-10-CM

## 2022-12-10 ENCOUNTER — Other Ambulatory Visit: Payer: Self-pay | Admitting: Family Medicine

## 2022-12-31 ENCOUNTER — Other Ambulatory Visit (HOSPITAL_COMMUNITY): Payer: Self-pay

## 2023-02-15 ENCOUNTER — Encounter: Payer: Self-pay | Admitting: Cardiology

## 2023-02-15 ENCOUNTER — Ambulatory Visit: Payer: Medicaid Other | Attending: Cardiology | Admitting: Cardiology

## 2023-02-15 ENCOUNTER — Ambulatory Visit: Payer: Medicaid Other

## 2023-02-15 VITALS — BP 142/102 | HR 75 | Ht 64.0 in | Wt 204.2 lb

## 2023-02-15 DIAGNOSIS — E7800 Pure hypercholesterolemia, unspecified: Secondary | ICD-10-CM | POA: Diagnosis not present

## 2023-02-15 DIAGNOSIS — R009 Unspecified abnormalities of heart beat: Secondary | ICD-10-CM

## 2023-02-15 DIAGNOSIS — I10 Essential (primary) hypertension: Secondary | ICD-10-CM

## 2023-02-15 NOTE — Patient Instructions (Signed)
Medication Instructions:   Your physician recommends that you continue on your current medications as directed. Please refer to the Current Medication list given to you today.  *If you need a refill on your cardiac medications before your next appointment, please call your pharmacy*   Lab Work:  None Ordered  If you have labs (blood work) drawn today and your tests are completely normal, you will receive your results only by: MyChart Message (if you have MyChart) OR A paper copy in the mail If you have any lab test that is abnormal or we need to change your treatment, we will call you to review the results.   Testing/Procedures:  Your physician has recommended that you wear a Zio monitor.   This monitor is a medical device that records the heart's electrical activity. Doctors most often use these monitors to diagnose arrhythmias. Arrhythmias are problems with the speed or rhythm of the heartbeat. The monitor is a small device applied to your chest. You can wear one while you do your normal daily activities. While wearing this monitor if you have any symptoms to push the button and record what you felt. Once you have worn this monitor for the period of time provider prescribed (Usually 14 days), you will return the monitor device in the postage paid box. Once it is returned they will download the data collected and provide us with a report which the provider will then review and we will call you with those results. Important tips:  Avoid showering during the first 24 hours of wearing the monitor. Avoid excessive sweating to help maximize wear time. Do not submerge the device, no hot tubs, and no swimming pools. Keep any lotions or oils away from the patch. After 24 hours you may shower with the patch on. Take brief showers with your back facing the shower head.  Do not remove patch once it has been placed because that will interrupt data and decrease adhesive wear time. Push the button  when you have any symptoms and write down what you were feeling. Once you have completed wearing your monitor, remove and place into box which has postage paid and place in your outgoing mailbox.  If for some reason you have misplaced your box then call our office and we can provide another box and/or mail it off for you.      Follow-Up: At [facility], you and your health needs are our priority.  As part of our continuing mission to provide you with exceptional heart care, we have created designated Provider Care Teams.  These Care Teams include your primary Cardiologist (physician) and Advanced Practice Providers (APPs -  Physician Assistants and Nurse Practitioners) who all work together to provide you with the care you need, when you need it.  We recommend signing up for the patient portal called "MyChart".  Sign up information is provided on this After Visit Summary.  MyChart is used to connect with patients for Virtual Visits (Telemedicine).  Patients are able to view lab/test results, encounter notes, upcoming appointments, etc.  Non-urgent messages can be sent to your provider as well.   To learn more about what you can do with MyChart, go to [email or web address].    Your next appointment:   6 - 8 week(s)  Provider:   You may see Brian Agbor-Etang, MD or one of the following Advanced Practice Providers on your designated Care Team:   Christopher Berge, NP Ryan Dunn, PA-C Cadence Furth, PA-C Sheri Hammock, NP  

## 2023-02-15 NOTE — Progress Notes (Signed)
Cardiology Office Note:    Date:  02/15/2023   ID:  Haley Mclaughlin, DOB 13-Mar-1966, MRN [id number]  PCP:  Carney Living, MD   [facility] Providers Cardiologist:  Debbe Odea, MD     Referring MD: Carney Living, *   Chief Complaint  Patient presents with   Follow-up    Patient having more frequent palpitations during rest.  Also tachycardia after eating for past couple of months.  Blood pressure is elevated today and took bp medications less than 1 hour ago    History of Present Illness:    Haley Mclaughlin is a 57 y.o. female with a hx of hypertension, hyperlipidemia, diabetes who presents due to palpitations.  Over the past several weeks, she has had a sensation of her heart "flopping" when she lays on her left side.  Also noticed a delay/pause in her heartbeat when this happens.  Compliant with BP medications as prescribed, systolic usually in the 130s at home.  Prior notes Echo 07/2021 EF 60 to 65%, normal diastolic function ETT 08/2021 no inducible ischemia, normal stress test Has a family history of CAD with mom having MI age 55.     Past Medical History:  Diagnosis Date   Abnormal Pap smear    Atherosclerosis of aorta (HCC) 07/21/2021   Gestational diabetes    diet controlled with #3   Hyperlipemia    Hyperlipidemia    Hypertension    PID (pelvic inflammatory disease)    Preterm labor    with last 4 preg, del at term   Sarcoidosis    Urinary tract infection     Past Surgical History:  Procedure Laterality Date   APPENDECTOMY     INDUCED ABORTION      Current Medications: Current Meds  Medication Sig   acetaminophen (TYLENOL) 325 MG tablet Take 650 mg by mouth every 6 (six) hours as needed.   atorvastatin (LIPITOR) 80 MG tablet TAKE 1 TABLET BY MOUTH EVERY DAY   hydrocortisone 2.5 % cream Apply topically 2 (two) times daily.   losartan (COZAAR) 100 MG tablet Take 1 tablet (100 mg total) by mouth daily.   Multiple  Vitamins-Minerals (MULTIVITAMIN ADULTS 50+) TABS Take 1 tablet by mouth daily.   omeprazole (PRILOSEC) 40 MG capsule TAKE 1 CAPSULE BY MOUTH EVERY DAY AS NEEDED     Allergies:   Sulfamethoxazole-trimethoprim   Social History   Socioeconomic History   Marital status: Married    Spouse name: Not on file   Number of children: Not on file   Years of education: Not on file   Highest education level: Not on file  Occupational History   Not on file  Tobacco Use   Smoking status: Never   Smokeless tobacco: Never  Vaping Use   Vaping Use: Never used  Substance and Sexual Activity   Alcohol use: No   Drug use: No   Sexual activity: Yes    Birth control/protection: Condom    Comment: IUD removed 06/17/2011  Other Topics Concern   Not on file  Social History Narrative   Not on file   Social Determinants of Health   Financial Resource Strain: Not on file  Food Insecurity: Not on file  Transportation Needs: Not on file  Physical Activity: Not on file  Stress: Not on file  Social Connections: Not on file     Family History: The patient's family history includes Diabetes in her father; Heart disease in her father and mother;  Hypertension in her mother and sister. There is no history of Anesthesia problems.  ROS:   Please see the history of present illness.     All other systems reviewed and are negative.  EKGs/Labs/Other Studies Reviewed:    The following studies were reviewed today:   EKG:  EKG is ordered today.  EKG shows normal sinus rhythm  Recent Labs: No results found for requested labs within last 365 days.  Recent Lipid Panel    Component Value Date/Time   CHOL 210 (H) 03/17/2022 1427   TRIG 101 03/17/2022 1427   HDL 31 (L) 03/17/2022 1427   CHOLHDL 6.8 (H) 03/17/2022 1427   CHOLHDL 9.9 01/09/2015 1040   VLDL 28 01/09/2015 1040   LDLCALC 161 (H) 03/17/2022 1427     Risk Assessment/Calculations:          Physical Exam:    VS:  BP (!) 142/102 (BP  Location: Left Arm, Patient Position: Sitting, Cuff Size: Large)   Pulse 75   Ht 5\' 4"  (1.626 m)   Wt 204 lb 3.2 oz (92.6 kg)   SpO2 99%   BMI 35.05 kg/m     Wt Readings from Last 3 Encounters:  02/15/23 204 lb 3.2 oz (92.6 kg)  08/14/22 211 lb 6 oz (95.9 kg)  07/15/22 210 lb (95.3 kg)     GEN:  Well nourished, well developed in no acute distress HEENT: Normal NECK: No JVD; No carotid bruits CARDIAC: RRR, no murmurs, rubs, gallops RESPIRATORY:  Clear to auscultation without rales, wheezing or rhonchi  ABDOMEN: Soft, non-tender, non-distended MUSCULOSKELETAL:  No edema; No deformity  SKIN: Warm and dry NEUROLOGIC:  Alert and oriented x 3 PSYCHIATRIC:  Normal affect   ASSESSMENT:    1. Heart beat abnormality   2. Primary hypertension   3. Pure hypercholesterolemia    PLAN:    In order of problems listed above:  Abnormal heartbeat, occasional pause when laying on left side.  This could be PVCs with compensatory pause.  Place cardiac monitor to evaluate any significant arrhythmias. Hypertension, BP elevated today, usually controlled.  Continue losartan 100 mg daily. Hypercholesterolemia, continue Lipitor 80 mg daily.    Follow-up in 6 weeks.     Medication Adjustments/Labs and Tests Ordered: Current medicines are reviewed at length with the patient today.  Concerns regarding medicines are outlined above.  Orders Placed This Encounter  Procedures   LONG TERM MONITOR (3-14 DAYS)   EKG 12-Lead   No orders of the defined types were placed in this encounter.   Patient Instructions  Medication Instructions:   Your physician recommends that you continue on your current medications as directed. Please refer to the Current Medication list given to you today.  *If you need a refill on your cardiac medications before your next appointment, please call your pharmacy*   Lab Work:  None Ordered  If you have labs (blood work) drawn today and your tests are completely  normal, you will receive your results only by: MyChart Message (if you have MyChart) OR A paper copy in the mail If you have any lab test that is abnormal or we need to change your treatment, we will call you to review the results.   Testing/Procedures:  Your physician has recommended that you wear a Zio monitor.   This monitor is a medical device that records the heart's electrical activity. Doctors most often use these monitors to diagnose arrhythmias. Arrhythmias are problems with the speed or rhythm of the heartbeat.  The monitor is a small device applied to your chest. You can wear one while you do your normal daily activities. While wearing this monitor if you have any symptoms to push the button and record what you felt. Once you have worn this monitor for the period of time provider prescribed (Usually 14 days), you will return the monitor device in the postage paid box. Once it is returned they will download the data collected and provide [place] with a report which the provider will then review and we will call you with those results. Important tips:  Avoid showering during the first 24 hours of wearing the monitor. Avoid excessive sweating to help maximize wear time. Do not submerge the device, no hot tubs, and no swimming pools. Keep any lotions or oils away from the patch. After 24 hours you may shower with the patch on. Take brief showers with your back facing the shower head.  Do not remove patch once it has been placed because that will interrupt data and decrease adhesive wear time. Push the button when you have any symptoms and write down what you were feeling. Once you have completed wearing your monitor, remove and place into box which has postage paid and place in your outgoing mailbox.  If for some reason you have misplaced your box then call our office and we can provide another box and/or mail it off for you.      Follow-Up: At [facility], you and your health  needs are our priority.  As part of our continuing mission to provide you with exceptional heart care, we have created designated Provider Care Teams.  These Care Teams include your primary Cardiologist (physician) and Advanced Practice Providers (APPs -  Physician Assistants and Nurse Practitioners) who all work together to provide you with the care you need, when you need it.  We recommend signing up for the patient portal called "MyChart".  Sign up information is provided on this After Visit Summary.  MyChart is used to connect with patients for Virtual Visits (Telemedicine).  Patients are able to view lab/test results, encounter notes, upcoming appointments, etc.  Non-urgent messages can be sent to your provider as well.   To learn more about what you can do with MyChart, go to [email or web address].    Your next appointment:    6-8 weeks  Provider:   You may see Debbe Odea, MD or one of the following Advanced Practice Providers on your designated Care Team:   Nicolasa Ducking, NP Eula Listen, PA-C Cadence Fransico Michael, PA-C Charlsie Quest, NP    Signed, Debbe Odea, MD  02/15/2023 12:27 PM    [facility]

## 2023-02-17 DIAGNOSIS — R009 Unspecified abnormalities of heart beat: Secondary | ICD-10-CM | POA: Diagnosis not present

## 2023-02-26 ENCOUNTER — Ambulatory Visit: Payer: Medicaid Other | Admitting: Family Medicine

## 2023-02-26 ENCOUNTER — Encounter: Payer: Self-pay | Admitting: Family Medicine

## 2023-02-26 ENCOUNTER — Other Ambulatory Visit: Payer: Self-pay

## 2023-02-26 VITALS — BP 134/85 | HR 76 | Ht 64.0 in | Wt 202.6 lb

## 2023-02-26 DIAGNOSIS — E1165 Type 2 diabetes mellitus with hyperglycemia: Secondary | ICD-10-CM | POA: Diagnosis present

## 2023-02-26 DIAGNOSIS — I152 Hypertension secondary to endocrine disorders: Secondary | ICD-10-CM

## 2023-02-26 DIAGNOSIS — E1159 Type 2 diabetes mellitus with other circulatory complications: Secondary | ICD-10-CM

## 2023-02-26 DIAGNOSIS — E7800 Pure hypercholesterolemia, unspecified: Secondary | ICD-10-CM | POA: Diagnosis not present

## 2023-02-26 LAB — POCT GLYCOSYLATED HEMOGLOBIN (HGB A1C): HbA1c, POC (controlled diabetic range): 7 % (ref 0.0–7.0)

## 2023-02-26 NOTE — Assessment & Plan Note (Signed)
Check lipids.  Only taking lipitor 80 mg daily

## 2023-02-26 NOTE — Assessment & Plan Note (Signed)
CT 07/11/21 was normal.

## 2023-02-26 NOTE — Progress Notes (Signed)
    SUBJECTIVE:   CHIEF COMPLAINT / HPI:   Here for physical feels well  Hypertension taking losartan regularly.  Took later than usual this AM about 30 minutes before the visit  Diabetes  Watching her diet and walking with her 56 yo granddaughter   Seeing cardiology regularly  No symptoms of [facility] or sarcoid   OBJECTIVE:   BP 134/85   Pulse 76   Ht 5\' 4"  (1.626 m)   Wt 202 lb 9.6 oz (91.9 kg)   SpO2 95%   BMI 34.78 kg/m   Heart - Regular rate and rhythm.  No murmurs, gallops or rubs.    Lungs:  Normal respiratory effort, chest expands symmetrically. Lungs are clear to auscultation, no crackles or wheezes. Abdomen: soft and non-tender without masses, organomegaly or hernias noted.  No guarding or rebound Extremities:  No cyanosis, edema, or deformity noted with good range of motion of all major joints.   Neurologic exam : Cn 2-7 intact Strength equal & normal in upper & lower extremities Able to walk on heels and toes.   Balance normal     ASSESSMENT/PLAN:   Type 2 diabetes mellitus with hyperglycemia, without long-term current use of insulin (HCC) Assessment & Plan: Diet controlled.  Continue exercise (hiking) and diet   Orders: -     POCT glycosylated hemoglobin (Hb A1C) -     Microalbumin / creatinine urine ratio  Hypertension associated with diabetes (HCC) Assessment & Plan: Stable continue current medications. Check labs   Orders: -     CMP14+EGFR -     CBC  Pure hypercholesterolemia Assessment & Plan: Check lipids.  Only taking lipitor 80 mg daily   Orders: -     Lipid panel     Patient Instructions  Good to see you today - Thank you for coming in  Things we discussed today:  You need an diabetes eye exam every year.  Please see your eye doctor.  Ask them to fax [place] a report of your exam   You need a Pap smear to prevent cervical cancer.  Please make an appointment.   I will call you if your tests are not good.  Otherwise, I will  send you a message on MyChart (if it is active) or a letter in the mail..  If you do not hear from me with in 2 weeks please call our office.      Please always bring your medication bottles  Come back to see me in 6 months for the diabetes    Carney Living, MD [facility]

## 2023-02-26 NOTE — Assessment & Plan Note (Signed)
Stable continue current medications. Check labs

## 2023-02-26 NOTE — Assessment & Plan Note (Signed)
Diet controlled.  Continue exercise (hiking) and diet

## 2023-02-26 NOTE — Patient Instructions (Addendum)
Good to see you today - Thank you for coming in  Things we discussed today:  You need an diabetes eye exam every year.  Please see your eye doctor.  Ask them to fax [place] a report of your exam   You need a Pap smear to prevent cervical cancer.  Please make an appointment.   I will call you if your tests are not good.  Otherwise, I will send you a message on MyChart (if it is active) or a letter in the mail..  If you do not hear from me with in 2 weeks please call our office.      Please always bring your medication bottles  Come back to see me in 6 months for the diabetes

## 2023-02-28 LAB — MICROALBUMIN / CREATININE URINE RATIO
Creatinine, Urine: 166.4 mg/dL
Microalb/Creat Ratio: 4 mg/g creat (ref 0–29)
Microalbumin, Urine: 6.9 ug/mL

## 2023-02-28 LAB — CMP14+EGFR
ALT: 25 IU/L (ref 0–32)
AST: 22 IU/L (ref 0–40)
Albumin: 4.1 g/dL (ref 3.8–4.9)
Alkaline Phosphatase: 63 IU/L (ref 44–121)
BUN/Creatinine Ratio: 14 (ref 9–23)
BUN: 10 mg/dL (ref 6–24)
Bilirubin Total: 0.7 mg/dL (ref 0.0–1.2)
CO2: 20 mmol/L (ref 20–29)
Calcium: 9.2 mg/dL (ref 8.7–10.2)
Chloride: 104 mmol/L (ref 96–106)
Creatinine, Ser: 0.71 mg/dL (ref 0.57–1.00)
Globulin, Total: 2.7 g/dL (ref 1.5–4.5)
Glucose: 128 mg/dL — ABNORMAL HIGH (ref 70–99)
Potassium: 4.3 mmol/L (ref 3.5–5.2)
Sodium: 139 mmol/L (ref 134–144)
Total Protein: 6.8 g/dL (ref 6.0–8.5)
eGFR: 100 mL/min/{1.73_m2} (ref >59)

## 2023-02-28 LAB — LIPID PANEL
Chol/HDL Ratio: 5.6 ratio — ABNORMAL HIGH (ref 0.0–4.4)
Cholesterol, Total: 196 mg/dL (ref 100–199)
HDL: 35 mg/dL — ABNORMAL LOW (ref >39)
LDL Chol Calc (NIH): 139 mg/dL — ABNORMAL HIGH (ref 0–99)
Triglycerides: 123 mg/dL (ref 0–149)
VLDL Cholesterol Cal: 22 mg/dL (ref 5–40)

## 2023-02-28 LAB — CBC
Hematocrit: 38.6 % (ref 34.0–46.6)
Hemoglobin: 13 g/dL (ref 11.1–15.9)
MCH: 28.6 pg (ref 26.6–33.0)
MCHC: 33.7 g/dL (ref 31.5–35.7)
MCV: 85 fL (ref 79–97)
Platelets: 259 10*3/uL (ref 150–450)
RBC: 4.54 x10E6/uL (ref 3.77–5.28)
RDW: 11.8 % (ref 11.7–15.4)
WBC: 6.6 10*3/uL (ref 3.4–10.8)

## 2023-03-17 ENCOUNTER — Other Ambulatory Visit: Payer: Self-pay | Admitting: Family Medicine

## 2023-03-30 ENCOUNTER — Ambulatory Visit: Payer: Medicaid Other | Attending: Medical | Admitting: Medical

## 2023-03-30 ENCOUNTER — Encounter: Payer: Self-pay | Admitting: Medical

## 2023-03-30 VITALS — BP 142/88 | HR 78 | Ht 64.0 in | Wt 203.6 lb

## 2023-03-30 DIAGNOSIS — I471 Supraventricular tachycardia, unspecified: Secondary | ICD-10-CM | POA: Diagnosis not present

## 2023-03-30 DIAGNOSIS — E7800 Pure hypercholesterolemia, unspecified: Secondary | ICD-10-CM

## 2023-03-30 DIAGNOSIS — I10 Essential (primary) hypertension: Secondary | ICD-10-CM | POA: Diagnosis not present

## 2023-03-30 DIAGNOSIS — Z79899 Other long term (current) drug therapy: Secondary | ICD-10-CM | POA: Diagnosis not present

## 2023-03-30 NOTE — Patient Instructions (Signed)
Medication Instructions:  Your physician recommends that you continue on your current medications as directed. Please refer to the Current Medication list given to you today.  *If you need a refill on your cardiac medications before your next appointment, please call your pharmacy*   Lab Work: Your provider would like for you to have following labs drawn today (Mg, TSH).     Testing/Procedures: None ordered today   Follow-Up: At [facility], you and your health needs are our priority.  As part of our continuing mission to provide you with exceptional heart care, we have created designated Provider Care Teams.  These Care Teams include your primary Cardiologist (physician) and Advanced Practice Providers (APPs -  Physician Assistants and Nurse Practitioners) who all work together to provide you with the care you need, when you need it.  We recommend signing up for the patient portal called "MyChart".  Sign up information is provided on this After Visit Summary.  MyChart is used to connect with patients for Virtual Visits (Telemedicine).  Patients are able to view lab/test results, encounter notes, upcoming appointments, etc.  Non-urgent messages can be sent to your provider as well.   To learn more about what you can do with MyChart, go to [email or web address].    Your next appointment:   6 month(s)  Provider:   You may see Debbe Odea, MD or one of the following Advanced Practice Providers on your designated Care Team:   Nicolasa Ducking, NP Eula Listen, PA-C Cadence Fransico Michael, PA-C Charlsie Quest, NP

## 2023-03-30 NOTE — Progress Notes (Signed)
Cardiology Office Note:    Date:  03/30/2023   ID:  Haley Mclaughlin, DOB Oct 06, 1965, MRN [id number]  PCP:  Carney Living, MD  [facility] Cardiologist:  Debbe Odea, MD  [facility] Electrophysiologist:  None   Referring MD: Carney Living, *   Chief Complaint: heart monitor follow-up  History of Present Illness:    Haley Mclaughlin is a 57 y.o. female with a hx of HTN, HLD, and Diabetes who presents for 6 month follow-up.    Due to risk factors, Cardiac CTA was ordered but heart rates were too elevated. Lexsican Myoview was recommended, but patient declined. Echo 07/2021 showed LVEF 60-65%, normal diastolic dysfunction. ETT was performed, which was normal with no ischemia.   Patient was last seen 01/2023 reporting palpitations.  Heart monitor was ordered. Heart monitor showed NSR, avg HR 84bpm, 3 runs of SVT, fastest 14 beats with a max HR 158bpm  Today, the hear monitor was reviewed. She is still feeling about the same. She still has palpitations and feeling like her heart flops. She denies lower leg edema, orthopnea or pnd. Patient feels symptoms are tolerable and she is not wanting to start a new medication.  Past Medical History:  Diagnosis Date   Abnormal Pap smear    Atherosclerosis of aorta (HCC) 07/21/2021   Gestational diabetes    diet controlled with #3   Hyperlipemia    Hyperlipidemia    Hypertension    PID (pelvic inflammatory disease)    Preterm labor    with last 4 preg, del at term   Sarcoidosis    Urinary tract infection     Past Surgical History:  Procedure Laterality Date   APPENDECTOMY     INDUCED ABORTION      Current Medications: Current Meds  Medication Sig   acetaminophen (TYLENOL) 325 MG tablet Take 650 mg by mouth every 6 (six) hours as needed.   atorvastatin (LIPITOR) 80 MG tablet TAKE 1 TABLET BY MOUTH EVERY DAY   hydrocortisone 2.5 % cream Apply topically 2 (two) times daily.   losartan (COZAAR) 100 MG tablet  Take 1 tablet (100 mg total) by mouth daily.   Multiple Vitamins-Minerals (MULTIVITAMIN ADULTS 50+) TABS Take 1 tablet by mouth daily.   omeprazole (PRILOSEC) 40 MG capsule TAKE 1 CAPSULE BY MOUTH EVERY DAY AS NEEDED     Allergies:   Sulfamethoxazole-trimethoprim   Social History   Socioeconomic History   Marital status: Married    Spouse name: Not on file   Number of children: Not on file   Years of education: Not on file   Highest education level: Not on file  Occupational History   Not on file  Tobacco Use   Smoking status: Never   Smokeless tobacco: Never  Vaping Use   Vaping status: Never Used  Substance and Sexual Activity   Alcohol use: No   Drug use: No   Sexual activity: Yes    Birth control/protection: Condom    Comment: IUD removed 06/17/2011  Other Topics Concern   Not on file  Social History Narrative   Not on file   Social Determinants of Health   Financial Resource Strain: Not on file  Food Insecurity: Not on file  Transportation Needs: Not on file  Physical Activity: Not on file  Stress: Not on file  Social Connections: Not on file     Family History: The patient's family history includes Diabetes in her father; Heart disease in her father  and mother; Hypertension in her mother and sister. There is no history of Anesthesia problems.  ROS:   Please see the history of present illness.     All other systems reviewed and are negative.  EKGs/Labs/Other Studies Reviewed:    The following studies were reviewed today:   Heart monitor 03/2023 Patch Wear Time:  13 days and 23 hours (2024-06-19T20:08:25-0400 to 2024-07-03T20:08:21-0400)   Patient had a min HR of 54 bpm, max HR of 173 bpm, and avg HR of 84 bpm. Predominant underlying rhythm was Sinus Rhythm. 3 Supraventricular Tachycardia runs occurred, the run with the fastest interval lasting 14 beats with a max rate of 158 bpm (avg 134  bpm); the run with the fastest interval was also the longest.  Supraventricular Tachycardia was detected within +/- 45 seconds of symptomatic patient event(s). Isolated SVEs were rare (<1.0%), SVE Couplets were rare (<1.0%), and SVE Triplets were rare  (<1.0%). Isolated VEs were rare (<1.0%, 5595), VE Couplets were rare (<1.0%, 21), and VE Triplets were rare (<1.0%, 1). Ventricular Trigeminy was present.    Conclusion Cardiac monitor showed rare PVCs and occasional SVTs. Patient triggered events associated with PVCs which were rare. Overall benign cardiac monitor with no significant sustained arrhythmias.  Echo 07/2021  1. Left ventricular ejection fraction, by estimation, is 60 to 65%. The  left ventricle has normal function. The left ventricle has no regional  wall motion abnormalities. There is mild left ventricular hypertrophy.  Left ventricular diastolic parameters  were normal.   2. Right ventricular systolic function is normal. The right ventricular  size is normal. There is normal pulmonary artery systolic pressure. The  estimated right ventricular systolic pressure is 23.4 mmHg.   3. The mitral valve is normal in structure. Trivial mitral valve  regurgitation. No evidence of mitral stenosis.   4. The aortic valve is tricuspid. Aortic valve regurgitation is not  visualized. No aortic stenosis is present.   5. The inferior vena cava is normal in size with greater than 50%  respiratory variability, suggesting right atrial pressure of 3 mmHg.    ETT 08/2021   No ST deviation was noted.   Normal stress test   No evidence for inducible ischemia    EKG:  EKG is not ordered today.    Recent Labs: 02/26/2023: ALT 25; BUN 10; Creatinine, Ser 0.71; Hemoglobin 13.0; Platelets 259; Potassium 4.3; Sodium 139  Recent Lipid Panel    Component Value Date/Time   CHOL 196 02/26/2023 1225   TRIG 123 02/26/2023 1225   HDL 35 (L) 02/26/2023 1225   CHOLHDL 5.6 (H) 02/26/2023 1225   CHOLHDL 9.9 01/09/2015 1040   VLDL 28 01/09/2015 1040   LDLCALC 139  (H) 02/26/2023 1225    Physical Exam:    VS:  BP (!) 142/88 (BP Location: Left Arm, Patient Position: Sitting)   Pulse 78   Ht 5\' 4"  (1.626 m)   Wt 203 lb 9.6 oz (92.4 kg)   SpO2 99%   BMI 34.95 kg/m     Wt Readings from Last 3 Encounters:  03/30/23 203 lb 9.6 oz (92.4 kg)  02/26/23 202 lb 9.6 oz (91.9 kg)  02/15/23 204 lb 3.2 oz (92.6 kg)     GEN:  Well nourished, well developed in no acute distress HEENT: Normal NECK: No JVD; No carotid bruits LYMPHATICS: No lymphadenopathy CARDIAC: RRR, no murmurs, rubs, gallops RESPIRATORY:  Clear to auscultation without rales, wheezing or rhonchi  ABDOMEN: Soft, non-tender, non-distended MUSCULOSKELETAL:  No edema;  No deformity  SKIN: Warm and dry NEUROLOGIC:  Alert and oriented x 3 PSYCHIATRIC:  Normal affect   ASSESSMENT:    1. PSVT (paroxysmal supraventricular tachycardia)   2. Medication management   3. Essential hypertension   4. HYPERCHOLESTEROLEMIA    PLAN:    In order of problems listed above:  pSVT Palpitations Recent heart monitor showed NSR, average HR 84bpm, 3 runs of SVT, the longest lasting 14 beats with a max rate of 115 bpm, fastest was also the longest, rare ventricular ectopy.  SVT was detected within 45 seconds of symptomatic patient events.  Patient reports persistent intermittent palpitations and heart fluttering.  She reports symptoms are tolerable.  She is not wanting to start additional medication for her symptoms.  She will let us know if symptoms worsen or become more frequent, can trial BB/CCB.  HTN BP is mildly elevated, but normal at home. Continue Losartan 100mg  daily.   Hypercholesterolemia LDL 139, continue Lipitor and Zetia.   Disposition: Follow up in 6 month(s) with MD/APP    Signed, Maykayla Highley David Stall, PA-C  03/30/2023 12:08 PM    [facility]

## 2023-03-31 ENCOUNTER — Other Ambulatory Visit: Payer: Self-pay | Admitting: Family Medicine

## 2023-03-31 ENCOUNTER — Other Ambulatory Visit: Payer: Self-pay

## 2023-03-31 MED ORDER — LOSARTAN POTASSIUM 100 MG PO TABS: 100.0000 mg | ORAL_TABLET | Freq: Every day | ORAL | 3 refills | Status: DC

## 2023-04-05 ENCOUNTER — Other Ambulatory Visit: Payer: Self-pay

## 2023-04-05 DIAGNOSIS — E079 Disorder of thyroid, unspecified: Secondary | ICD-10-CM

## 2023-04-20 NOTE — Progress Notes (Unsigned)
    SUBJECTIVE:   CHIEF COMPLAINT / HPI:   TSH Low Was checked in July by Cardiology.  Follow up T4 T3 was normal.  Other than feeling somewhat tired the last few weeks no change in palpitations or any temperature intolerance or insomnia or weight/appetite change  Diabetes Diet controlled.  Her weight loss has stopped thinks due to walking less.  Plans to restart  Cutaneous Sarcoid Would like a re-referral to [facility] dermatology who was following thise     OBJECTIVE:   BP 118/60   Pulse 69   Temp 97.7 F (36.5 C) (Oral)   Ht 5\' 4"  (1.626 m)   Wt 202 lb (91.6 kg)   SpO2 99%   BMI 34.67 kg/m   Heart - Regular rate and rhythm.  Gr1-2 early systolic murmur No gallops or rubs.    Lungs:  Normal respiratory effort, chest expands symmetrically. Lungs are clear to auscultation, no crackles or wheezes. Neck:  No deformities, thyromegaly, masses, or tenderness noted.   Supple with full range of motion without pain. Extremities:  No cyanosis, edema, or deformity noted with good range of motion of all major joints.     ASSESSMENT/PLAN:   Cutaneous sarcoidosis Assessment & Plan: Put in referral for [facility] dermatology  Orders: -     Ambulatory referral to Dermatology  Low TSH level Assessment & Plan: No definite symptoms of hyperthyroidism.  Likely subclinical hyperthyroidism or lab variation.  Will recheck TSH and T4 in a month.    Orders: -     TSH; Future -     T4, free; Future  Type 2 diabetes mellitus with hyperglycemia, without long-term current use of insulin (HCC) Assessment & Plan: Unchanged.  No longer losing weight.  She will work on more walking.  Discussed GLP1s as possible option       Patient Instructions  Good to see you today - Thank you for coming in  Things we discussed today:  Thyroid - come in for a blood test in 3-4 weeks - call the day before to make a lab appointment   Keep walking. We will talk more about GLP1s next vist  You need a Pap smear  to prevent cervical cancer.  Please make an appointment.   You need an diabetes eye exam every year.  Please see your eye doctor.  Ask them to fax [place] a report of your exam   Please always bring your medication bottles  Come back to see me in October   Carney Living, MD [facility]

## 2023-04-20 NOTE — Patient Instructions (Signed)
Good to see you today - Thank you for coming in  Things we discussed today:  You need a Pap smear to prevent cervical cancer.  Please make an appointment.   You need an diabetes eye exam every year.  Please see your eye doctor.  Ask them to fax [place] a report of your exam   Please always bring your medication bottles  Come back to see me in ***

## 2023-04-21 ENCOUNTER — Encounter: Payer: Self-pay | Admitting: Family Medicine

## 2023-04-21 ENCOUNTER — Ambulatory Visit: Payer: Medicaid Other | Admitting: Family Medicine

## 2023-04-21 VITALS — BP 118/60 | HR 69 | Temp 97.7°F | Ht 64.0 in | Wt 202.0 lb

## 2023-04-21 DIAGNOSIS — E1165 Type 2 diabetes mellitus with hyperglycemia: Secondary | ICD-10-CM | POA: Diagnosis not present

## 2023-04-21 DIAGNOSIS — D863 Sarcoidosis of skin: Secondary | ICD-10-CM

## 2023-04-21 DIAGNOSIS — R7989 Other specified abnormal findings of blood chemistry: Secondary | ICD-10-CM

## 2023-04-21 NOTE — Assessment & Plan Note (Signed)
Put in referral for [facility] dermatology

## 2023-04-21 NOTE — Assessment & Plan Note (Addendum)
No definite symptoms of hyperthyroidism.  Likely subclinical hyperthyroidism or lab variation.  Will recheck TSH and T4 in a month.    On repeat testing TSH still low but normal T4.  Since she is < 56 yo and low risk for osteoporosis will observe and repeat in 3 months. If TSH remains < 0.1 would consider endocrinology referral.

## 2023-04-21 NOTE — Assessment & Plan Note (Signed)
Unchanged.  No longer losing weight.  She will work on more walking.  Discussed GLP1s as possible option

## 2023-04-29 NOTE — Progress Notes (Signed)
    SUBJECTIVE:   CHIEF COMPLAINT: Pap smear HPI:   Haley Mclaughlin is a 57 y.o.  with history notable for cutaneous sarcoid and well controlled type 2 DM presenting for Pap smear.   She reports she has had some heavy cycles the last 2 months.Cycles are every 28 days, usually last 4 days. Last two were longer (6 days) and heavier. No symptoms of anemia. Not sexually active. Had normal pelvic ultrasound in 2019.   She is not sure when her mom entered menopause. She denies hot flashes, vaginal dryness.   She is compliant with her medications.   PERTINENT  PMH / PSH/Family/Social History : Pap was normal in 2018   OBJECTIVE:   BP (!) 140/90   Pulse 83   Ht 5\' 4"  (1.626 m)   Wt 199 lb 6.4 oz (90.4 kg)   SpO2 97%   BMI 34.23 kg/m   Today's weight:  Last Weight  Most recent update: 04/30/2023 10:11 AM    Weight  90.4 kg (199 lb 6.4 oz)            Review of prior weights: Filed Weights   04/30/23 1009  Weight: 199 lb 6.4 oz (90.4 kg)   RRR Lungs clear  GU Exam:    External exam: Normal-appearing female external genitalia.  Vaginal exam notable for some mild prolapsing of anterior wall.  Cervix without discharge or obvious lesion.  Bimanual exam reveals normal sized uterus no masses appreciated, no pain with examination.  Chaperoned examine, CMA Alycia. '  ASSESSMENT/PLAN:   No problem-specific Assessment & Plan notes found for this encounter.   Heavy menses No signs of symptoms of anemia.  This is only been ongoing 2 months.  I suspect she may be entering into menopause in the coming year or so.  She does not have any significant symptoms suggestive of menopause.  She is unsure when her vomiting to this.  Also considered but less likely fibroids given normal exam.  Will check a CBC and ferritin today.  If this persists I did discuss with her she may need ultrasound and referral to gynecology hide low suspicion for hyperplasia or atypia at this point given it has been just  2 months. 3  Healthcare maintenance, need for Pap smear Completed today      Terisa Starr, MD  Family Medicine Teaching Service  [facility]

## 2023-04-30 ENCOUNTER — Encounter: Payer: Self-pay | Admitting: Family Medicine

## 2023-04-30 ENCOUNTER — Ambulatory Visit: Payer: Medicaid Other | Admitting: Family Medicine

## 2023-04-30 ENCOUNTER — Other Ambulatory Visit (HOSPITAL_COMMUNITY)
Admission: RE | Admit: 2023-04-30 | Discharge: 2023-04-30 | Disposition: A | Discharge status: Home / Self Care | Payer: Medicaid Other | Source: Ambulatory Visit | Attending: Family Medicine | Admitting: Family Medicine

## 2023-04-30 VITALS — BP 140/90 | HR 83 | Ht 64.0 in | Wt 199.4 lb

## 2023-04-30 DIAGNOSIS — Z124 Encounter for screening for malignant neoplasm of cervix: Secondary | ICD-10-CM | POA: Diagnosis present

## 2023-04-30 DIAGNOSIS — N920 Excessive and frequent menstruation with regular cycle: Secondary | ICD-10-CM

## 2023-04-30 DIAGNOSIS — R7989 Other specified abnormal findings of blood chemistry: Secondary | ICD-10-CM

## 2023-04-30 NOTE — Patient Instructions (Signed)
It was wonderful to see you today.  Please bring ALL of your medications with you to every visit.   Today we talked about:  --Checking your blood count and iron given your heavy menses  --If your bleeding continues to be heavy, we may need an ultrasound  - Everything looked normal on examination   -- Please follow up for a lab visit for your thyroid--please schedule a lab visit for this   --I will message you with Pap results   Follow up for blood work in 2-3 weeks  Thank you for choosing [facility].   Please call [phone] with any questions about today's appointment.  Please be sure to schedule follow up at the front  desk before you leave today.   Terisa Starr, MD  Family Medicine

## 2023-04-30 NOTE — Assessment & Plan Note (Signed)
She will return for follow-up in about 2 to 3 weeks for lab visit.

## 2023-05-01 ENCOUNTER — Encounter: Payer: Self-pay | Admitting: Family Medicine

## 2023-05-01 LAB — FERRITIN: Ferritin: 32 ng/mL (ref 15–150)

## 2023-05-01 LAB — CBC
Hematocrit: 38.7 % (ref 34.0–46.6)
Hemoglobin: 12.7 g/dL (ref 11.1–15.9)
MCH: 29.8 pg (ref 26.6–33.0)
MCHC: 32.8 g/dL (ref 31.5–35.7)
MCV: 91 fL (ref 79–97)
Platelets: 263 10*3/uL (ref 150–450)
RBC: 4.26 x10E6/uL (ref 3.77–5.28)
RDW: 12 % (ref 11.7–15.4)
WBC: 5.6 10*3/uL (ref 3.4–10.8)

## 2023-05-06 LAB — CYTOLOGY - PAP
Comment: NEGATIVE
Diagnosis: NEGATIVE
High risk HPV: NEGATIVE

## 2023-05-14 ENCOUNTER — Other Ambulatory Visit: Payer: Medicaid Other

## 2023-05-14 DIAGNOSIS — R7989 Other specified abnormal findings of blood chemistry: Secondary | ICD-10-CM

## 2023-05-15 LAB — T4, FREE: Free T4: 1.3 ng/dL (ref 0.82–1.77)

## 2023-05-15 LAB — TSH: TSH: 0.029 u[IU]/mL — ABNORMAL LOW (ref 0.450–4.500)

## 2023-05-27 ENCOUNTER — Encounter: Payer: Self-pay | Admitting: Family Medicine

## 2023-08-18 ENCOUNTER — Ambulatory Visit: Payer: Medicaid Other | Admitting: Family Medicine

## 2023-08-18 ENCOUNTER — Encounter: Payer: Self-pay | Admitting: Family Medicine

## 2023-08-18 VITALS — BP 132/84 | HR 81 | Ht 64.0 in | Wt 202.4 lb

## 2023-08-18 DIAGNOSIS — E1165 Type 2 diabetes mellitus with hyperglycemia: Secondary | ICD-10-CM

## 2023-08-18 DIAGNOSIS — I152 Hypertension secondary to endocrine disorders: Secondary | ICD-10-CM | POA: Diagnosis not present

## 2023-08-18 DIAGNOSIS — D869 Sarcoidosis, unspecified: Secondary | ICD-10-CM

## 2023-08-18 DIAGNOSIS — F410 Panic disorder [episodic paroxysmal anxiety] without agoraphobia: Secondary | ICD-10-CM

## 2023-08-18 DIAGNOSIS — D863 Sarcoidosis of skin: Secondary | ICD-10-CM | POA: Diagnosis not present

## 2023-08-18 DIAGNOSIS — Z131 Encounter for screening for diabetes mellitus: Secondary | ICD-10-CM

## 2023-08-18 DIAGNOSIS — E7800 Pure hypercholesterolemia, unspecified: Secondary | ICD-10-CM | POA: Diagnosis not present

## 2023-08-18 DIAGNOSIS — R7989 Other specified abnormal findings of blood chemistry: Secondary | ICD-10-CM | POA: Diagnosis not present

## 2023-08-18 DIAGNOSIS — E1159 Type 2 diabetes mellitus with other circulatory complications: Secondary | ICD-10-CM | POA: Diagnosis not present

## 2023-08-18 LAB — POCT GLYCOSYLATED HEMOGLOBIN (HGB A1C): HbA1c, POC (controlled diabetic range): 7.8 % — AB (ref 0.0–7.0)

## 2023-08-18 NOTE — Patient Instructions (Addendum)
Things we discussed today:  I will call you if your tests are not good.  Otherwise, I will send you a message on MyChart (if it is active) or a letter in the mail..  If you do not hear from me with in 2 weeks please call our office.     You need an diabetes eye exam every year.  Please see your eye doctor.  Ask them to fax [place] a report of your exam   Consider - Flu and shingles immunizations  Come back to see your new doctor in 3-6 months  I will really miss working with you and your family - Be Well

## 2023-08-18 NOTE — Assessment & Plan Note (Signed)
Stable although currently has some stress with caring for her mother and her children and with her medical diagnoses.

## 2023-08-18 NOTE — Assessment & Plan Note (Signed)
Asymptomatic. Will recheck labs

## 2023-08-18 NOTE — Assessment & Plan Note (Signed)
Not as well controlled.  Discussed reasons to start medication.  She is very reluctant.  Will continue to work on diet and exercise and think about medications

## 2023-08-18 NOTE — Assessment & Plan Note (Signed)
Followed by cardiology.  Will recheck today

## 2023-08-18 NOTE — Assessment & Plan Note (Signed)
At goal today continue losartan

## 2023-08-18 NOTE — Progress Notes (Signed)
    SUBJECTIVE:   CHIEF COMPLAINT / HPI:   Diabetes Working on diet and exercise but hard over the holidays.  Really does not want to take a medication  Thyroid No new symptoms - no neck pain or significant weight loss  Cholesterol  Taking lipitor 80 mg daily Does not think ever took zetia.  Cardiology follows this   OBJECTIVE:   BP 132/84   Pulse 81   Ht 5\' 4"  (1.626 m)   Wt 202 lb 6.4 oz (91.8 kg)   SpO2 100%   BMI 34.74 kg/m   Weight is unchanged Normal pulse   ASSESSMENT/PLAN:   Screening for diabetes mellitus  Type 2 diabetes mellitus with hyperglycemia, without long-term current use of insulin (HCC) Assessment & Plan: Not as well controlled.  Discussed reasons to start medication.  She is very reluctant.  Will continue to work on diet and exercise and think about medications   Orders: -     POCT glycosylated hemoglobin (Hb A1C)  HYPERCHOLESTEROLEMIA Assessment & Plan: Followed by cardiology.  Will recheck today   Orders: -     Lipid panel  Low TSH level Assessment & Plan: Asymptomatic.  Will recheck labs   Orders: -     TSH -     T4, free  Lofgren's syndrome  Hypertension associated with diabetes (HCC) Assessment & Plan: At goal today continue losartan    Cutaneous sarcoidosis  PANIC ATTACKS Assessment & Plan: Stable although currently has some stress with caring for her mother and her children and with her medical diagnoses.        Patient Instructions  Things we discussed today:  I will call you if your tests are not good.  Otherwise, I will send you a message on MyChart (if it is active) or a letter in the mail..  If you do not hear from me with in 2 weeks please call our office.     You need an diabetes eye exam every year.  Please see your eye doctor.  Ask them to fax [place] a report of your exam   Consider - Flu and shingles immunizations  Come back to see your new doctor in 3-6 months  I will really miss working with you and  your family - Be Well    Carney Living, MD [facility]

## 2023-08-19 LAB — LIPID PANEL
Chol/HDL Ratio: 5.7 {ratio} — ABNORMAL HIGH (ref 0.0–4.4)
Cholesterol, Total: 228 mg/dL — ABNORMAL HIGH (ref 100–199)
HDL: 40 mg/dL (ref >39)
LDL Chol Calc (NIH): 176 mg/dL — ABNORMAL HIGH (ref 0–99)
Triglycerides: 71 mg/dL (ref 0–149)
VLDL Cholesterol Cal: 12 mg/dL (ref 5–40)

## 2023-08-19 LAB — T4, FREE: Free T4: 1.27 ng/dL (ref 0.82–1.77)

## 2023-08-19 LAB — TSH: TSH: 0.082 u[IU]/mL — ABNORMAL LOW (ref 0.450–4.500)

## 2023-09-09 ENCOUNTER — Encounter: Payer: Self-pay | Admitting: Family Medicine

## 2023-10-14 ENCOUNTER — Other Ambulatory Visit: Payer: Self-pay | Admitting: Family Medicine

## 2023-12-24 ENCOUNTER — Encounter: Payer: Self-pay | Admitting: Nurse Practitioner

## 2023-12-24 ENCOUNTER — Ambulatory Visit: Attending: Nurse Practitioner | Admitting: Nurse Practitioner

## 2023-12-24 VITALS — BP 160/96 | HR 70 | Ht 64.0 in | Wt 203.8 lb

## 2023-12-24 DIAGNOSIS — I10 Essential (primary) hypertension: Secondary | ICD-10-CM

## 2023-12-24 DIAGNOSIS — E782 Mixed hyperlipidemia: Secondary | ICD-10-CM | POA: Diagnosis not present

## 2023-12-24 DIAGNOSIS — I471 Supraventricular tachycardia, unspecified: Secondary | ICD-10-CM | POA: Diagnosis not present

## 2023-12-24 MED ORDER — CARVEDILOL 3.125 MG PO TABS: 3.1250 mg | ORAL_TABLET | Freq: Two times a day (BID) | ORAL | 3 refills | Status: AC

## 2023-12-24 NOTE — Patient Instructions (Addendum)
 Medication Instructions:  START Carvedilol 3.125 mg twice daily  *If you need a refill on your cardiac medications before your next appointment, please call your pharmacy*  Lab Work: None ordered If you have labs (blood work) drawn today and your tests are completely normal, you will receive your results only by: MyChart Message (if you have MyChart) OR A paper copy in the mail If you have any lab test that is abnormal or we need to change your treatment, we will call you to review the results.  Testing/Procedures: None ordered  Follow-Up: At [facility], you and your health needs are our priority.  As part of our continuing mission to provide you with exceptional heart care, our providers are all part of one team.  This team includes your primary Cardiologist (physician) and Advanced Practice Providers or APPs (Physician Assistants and Nurse Practitioners) who all work together to provide you with the care you need, when you need it.  Your next appointment:   6 month(s)  Provider:   Laneta Pintos, NP    We recommend signing up for the patient portal called "MyChart".  Sign up information is provided on this After Visit Summary.  MyChart is used to connect with patients for Virtual Visits (Telemedicine).  Patients are able to view lab/test results, encounter notes, upcoming appointments, etc.  Non-urgent messages can be sent to your provider as well.   To learn more about what you can do with MyChart, go to [email or web address].   Other Instructions

## 2023-12-24 NOTE — Progress Notes (Signed)
 Office Visit    Patient Name: Haley Mclaughlin Date of Encounter: 12/24/2023  Primary Care Provider:  Kandis Ormond, DO Primary Cardiologist:  Constancia Delton, MD  Chief Complaint    58 y.o. female with a history of chest pain, dyspnea, hypertension, hyperlipidemia, diabetes, palpitations, family history of premature CAD, and obesity, presents for follow-up related to HTN and palpitations.  Past Medical History  Subjective   Past Medical History:  Diagnosis Date   Abnormal Pap smear    Atherosclerosis of aorta (HCC) 07/21/2021   Dyspnea on exertion    a. 07/2021 Echo: EF of 60 to 65% without regional wall motion abnormalities, mild LVH, and trivial MR; b. 08/2021 ETT: Ex time 16m 26s. No ST/T changes ->low risk.   Gestational diabetes    diet controlled with #3   Hyperlipemia    Hyperlipidemia    Hypertension    PID (pelvic inflammatory disease)    Preterm labor    with last 4 preg, del at term   PSVT (paroxysmal supraventricular tachycardia) (HCC)    a. 01/2023 Zio: predominantly sinus rhythm at 84 bpm with 3 brief runs of SVT (up to 14 beats at 158 bpm) and rare PACs/PVCs.  Triggered events were associated with SVT.   Sarcoidosis    Urinary tract infection    Past Surgical History:  Procedure Laterality Date   APPENDECTOMY     INDUCED ABORTION      Allergies  Allergies  Allergen Reactions   Sulfamethoxazole-Trimethoprim Rash and Other (See Comments)    "Feels shaky" also "Feels shaky" Other reaction(s): Other (See Comments) "Feels shaky" "Feels shaky" also "Feels shaky" Other reaction(s): Other (See Comments) "Feels shaky" "Feels shaky" also "Feels shaky" Other reaction(s): Other (See Comments) "Feels shaky" "Feels shaky" Other reaction(s): Other (See Comments) "Feels shaky" "Feels shaky" also       History of Present Illness      58 y.o. y/o female with a history of chest pain, dyspnea, hypertension, hyperlipidemia, diabetes, palpitations,  family history of premature CAD (mother had an MI at age 53), and obesity.  She previously established care with Dr. Junnie Olives in December 2022 in the setting of dyspnea on exertion.  Echocardiogram in December 2022 showed an EF of 60 to 65% without regional wall motion abnormalities, mild LVH, and trivial MR.  Coronary CTA was ordered but heart rates were too elevated to perform.  Lexiscan Myoview was offered but patient declined.  She subsequently underwent exercise treadmill testing in January 2023 showed no evidence of ST/T changes, with fair exercise tolerance, walking 6 minutes 26 seconds (7.6 METS).  In June 2024, Ms. Dahmer complained of palpitations.  Event monitoring showed predominantly sinus rhythm at 84 bpm with 3 brief runs of SVT (up to 14 beats at 158 bpm) and rare PACs/PVCs.  Triggered events were associated with SVT.  She continued to have palpitations at follow-up visit in July 2024 and declined initiation of an AV nodal blocking agent.   Over the past year, she has continued to have intermittent palpitations.  These occur a few times a month, lasting up to about 20 seconds, and is associated with a sensation of elevated heart rates and dyspnea.  Symptoms resolved spontaneously.  She is unaware of any particular triggers.  She sometimes checks her blood pressure at home and frequently sees numbers in the 140s and above.  She works for [employer]), usually about 3 hours a day.  She does not routinely exercise.  She  denies chest pain, dyspnea, PND, orthopnea, dizziness, syncope, edema, or early satiety. Objective  Home Medications    Current Outpatient Medications  Medication Sig Dispense Refill   acetaminophen  (TYLENOL ) 325 MG tablet Take 650 mg by mouth every 6 (six) hours as needed.     atorvastatin  (LIPITOR) 80 MG tablet TAKE 1 TABLET BY MOUTH EVERY DAY 90 tablet 3   carvedilol (COREG) 3.125 MG tablet Take 1 tablet (3.125 mg total) by mouth 2 (two) times daily. 180 tablet 3    hydrocortisone  2.5 % cream Apply topically 2 (two) times daily. 30 g 2   losartan  (COZAAR ) 100 MG tablet Take 1 tablet (100 mg total) by mouth daily. 90 tablet 3   Multiple Vitamins-Minerals (MULTIVITAMIN ADULTS 50+) TABS Take 1 tablet by mouth daily.     omeprazole  (PRILOSEC) 40 MG capsule TAKE 1 CAPSULE BY MOUTH EVERY DAY AS NEEDED 90 capsule 0   No current facility-administered medications for this visit.     Physical Exam    VS:  BP (!) 158/90 (BP Location: Left Arm, Patient Position: Sitting)   Pulse 70   Ht 5\' 4"  (1.626 m)   Wt 203 lb 12.8 oz (92.4 kg)   SpO2 95%   BMI 34.98 kg/m  , BMI Body mass index is 34.98 kg/m.     Vitals:   12/24/23 1010 12/24/23 1226  BP: (!) 158/90 (!) 160/96  Pulse: 70   SpO2: 95%       GEN: Well nourished, well developed, in no acute distress. HEENT: normal. Neck: Supple, no JVD, carotid bruits, or masses. Cardiac: RRR, no murmurs, rubs, or gallops. No clubbing, cyanosis, edema.  Radials 2+/PT 2+ and equal bilaterally.  Respiratory:  Respirations regular and unlabored, clear to auscultation bilaterally. GI: Soft, nontender, nondistended, BS + x 4. MS: no deformity or atrophy. Skin: warm and dry, no rash. Neuro:  Strength and sensation are intact. Psych: Normal affect.  Accessory Clinical Findings    ECG personally reviewed by me today - EKG Interpretation Date/Time:  Friday December 24 2023 10:14:16 EDT Ventricular Rate:  74 PR Interval:  144 QRS Duration:  84 QT Interval:  398 QTC Calculation: 441 R Axis:   42  Text Interpretation: Normal sinus rhythm Cannot rule out Anterior infarct , age undetermined Confirmed by Laneta Pintos [phone]) on 12/24/2023 10:27:27 AM  - no acute changes.  Lab Results  Component Value Date   WBC 5.6 04/30/2023   HGB 12.7 04/30/2023   HCT 38.7 04/30/2023   MCV 91 04/30/2023   PLT 263 04/30/2023   Lab Results  Component Value Date   CREATININE 0.71 02/26/2023   BUN 10 02/26/2023   NA 139  02/26/2023   K 4.3 02/26/2023   CL 104 02/26/2023   CO2 20 02/26/2023   Lab Results  Component Value Date   ALT 25 02/26/2023   AST 22 02/26/2023   ALKPHOS 63 02/26/2023   BILITOT 0.7 02/26/2023   Lab Results  Component Value Date   CHOL 228 (H) 08/18/2023   HDL 40 08/18/2023   LDLCALC 176 (H) 08/18/2023   TRIG 71 08/18/2023   CHOLHDL 5.7 (H) 08/18/2023    Lab Results  Component Value Date   HGBA1C 7.8 (A) 08/18/2023   Lab Results  Component Value Date   TSH 0.082 (L) 08/18/2023       Assessment & Plan    1.  Primary hypertension: Blood pressure elevated on 2 readings today.  She only occasionally checks blood  pressure at home but notes that it is frequently greater than 140.  She has been reluctant to take additional medication for her blood pressure and we talked at length about lifestyle modifications, which she does plan to pursue.  After repeat blood pressure remained elevated at 160/96, she was willing to accept a prescription for carvedilol 3.125 mg twice daily.  This was chosen given ongoing palpitations with prior documented brief episodes of PSVT.  She will continue to monitor blood pressure closely at home.  2.  Palpitations/PSVT: Monitoring in June 2024 showed 3 brief runs of SVT up to 14 beats and 158 bpm, which were associated with triggered events.  Previously reluctant to accept prescription for AV nodal blocking agent.  As above, in the setting of ongoing palpitations and hypertension, I am prescribing carvedilol 3.125 mg twice daily today.  3.  Hyperlipidemia: LDL was 176 in December 2024, up from 139 in June 2024.  She reports compliance with atorvastatin  at this time but notes that due to concerns about taking medicines, she has not always been compliant in the past.  We discussed the importance of lifestyle modifications and the impact that these may have on her lipids (see below).  At this time she would like to try lifestyle modifications instead of adding  additional medication like Zetia  or bempedoic acid .    4.  Morbid obesity: She does not routine exercise at this time and notes a fair amount of meat consumption.  I strongly encouraged at least 30 minutes of moderate exercise daily as well as a more vegetable based diet.  She says she plans on starting to walk 2 miles daily as part of a May challenge that she signed up for.  She says she likes vegetables and will try incorporating more into her diet.  We set a goal weight loss of 1 pound per week and agreed to follow-up in 6 months to reevaluate.  5.  Disposition: Follow-up in 6 months or sooner if necessary.  Laneta Pintos, NP 12/24/2023, 12:21 PM

## 2024-02-10 NOTE — Progress Notes (Signed)
   SUBJECTIVE:   CHIEF COMPLAINT / HPI:   Hypertension: - Medications: coreg , losartan  - Compliance: good - Checking BP at home: yes, 130-150s SBP/90s.   Diabetes, Type 2 - Last A1c 7.8 08/2023 - Medications: none, diet controlled  - Eye exam: due - Foot exam: due - Microalbumin: due - Statin: yes  HLD - medications: lipitor - compliance: good - medication SEs: none  Sarcoidosis - with h/o Lofren's syndrome, follows with [facility]  Palpitations/PSVT - follows with Cardiology - unknown trigger but assoc with elevated HR and SOB. - started on coreg  with Cardiology 11/2023 - still getting palpitations, random when occurs - unsure coreg  is helping. Twice yesterday, before 2 weeks ago. - no associated chest pain, shortness of breath  OBJECTIVE:   BP 138/82   Pulse 87   Ht 5' 4 (1.626 m)   Wt 200 lb 12.8 oz (91.1 kg)   LMP 02/09/2024   SpO2 97%   BMI 34.47 kg/m   Gen: well appearing, in NAD Card: RRR Lungs: CTAB Ext: WWP, no edema   ASSESSMENT/PLAN:   Hypertension associated with diabetes (HCC) Doing well on current regimen, no changes made today.  Type 2 diabetes mellitus with hyperglycemia (HCC) At goal, diet controlled.  Low TSH level Recheck. If remains abnormal and consistent with subclinical hyperthyroidism, favor treating given recent h/o palpitations.   HYPERCHOLESTEROLEMIA Recheck. If remains elevated on recheck, consider addition of zetia . Already on max dose statin.   F/u 3-6 months pending lab results.  Kandis Ormond, DO

## 2024-02-11 ENCOUNTER — Ambulatory Visit: Admitting: Family Medicine

## 2024-02-11 ENCOUNTER — Encounter: Payer: Self-pay | Admitting: Family Medicine

## 2024-02-11 VITALS — BP 138/82 | HR 87 | Ht 64.0 in | Wt 200.8 lb

## 2024-02-11 DIAGNOSIS — R002 Palpitations: Secondary | ICD-10-CM

## 2024-02-11 DIAGNOSIS — E1159 Type 2 diabetes mellitus with other circulatory complications: Secondary | ICD-10-CM

## 2024-02-11 DIAGNOSIS — I152 Hypertension secondary to endocrine disorders: Secondary | ICD-10-CM

## 2024-02-11 DIAGNOSIS — R7989 Other specified abnormal findings of blood chemistry: Secondary | ICD-10-CM | POA: Diagnosis not present

## 2024-02-11 DIAGNOSIS — E7800 Pure hypercholesterolemia, unspecified: Secondary | ICD-10-CM | POA: Diagnosis not present

## 2024-02-11 DIAGNOSIS — E1165 Type 2 diabetes mellitus with hyperglycemia: Secondary | ICD-10-CM | POA: Diagnosis present

## 2024-02-11 LAB — POCT GLYCOSYLATED HEMOGLOBIN (HGB A1C): HbA1c, POC (controlled diabetic range): 6.9 % (ref 0.0–7.0)

## 2024-02-11 NOTE — Assessment & Plan Note (Signed)
Doing well on current regimen, no changes made today. 

## 2024-02-11 NOTE — Assessment & Plan Note (Signed)
 At goal, diet controlled.

## 2024-02-11 NOTE — Assessment & Plan Note (Signed)
 Recheck. If remains elevated on recheck, consider addition of zetia . Already on max dose statin.

## 2024-02-11 NOTE — Assessment & Plan Note (Signed)
 Recheck. If remains abnormal and consistent with subclinical hyperthyroidism, favor treating given recent h/o palpitations.

## 2024-02-11 NOTE — Patient Instructions (Signed)
 It was great to see you!  Our plans for today:  - No changes to your medications today.  - We are checking some labs today, we will release these results to your MyChart. - Come back in 3-6 months depending on your labwork.   Take care and seek immediate care sooner if you develop any concerns.   Dr. Rama Mcclintock

## 2024-02-14 ENCOUNTER — Encounter: Payer: Self-pay | Admitting: Family Medicine

## 2024-02-14 ENCOUNTER — Other Ambulatory Visit: Payer: Self-pay | Admitting: Family Medicine

## 2024-02-14 DIAGNOSIS — Z1231 Encounter for screening mammogram for malignant neoplasm of breast: Secondary | ICD-10-CM

## 2024-02-14 NOTE — Addendum Note (Signed)
 Addended by: Kandis Ormond on: 02/14/2024 12:21 PM   Modules accepted: Orders

## 2024-02-15 ENCOUNTER — Other Ambulatory Visit

## 2024-02-15 DIAGNOSIS — E1165 Type 2 diabetes mellitus with hyperglycemia: Secondary | ICD-10-CM

## 2024-02-15 DIAGNOSIS — E7800 Pure hypercholesterolemia, unspecified: Secondary | ICD-10-CM

## 2024-02-15 DIAGNOSIS — R7989 Other specified abnormal findings of blood chemistry: Secondary | ICD-10-CM

## 2024-02-15 DIAGNOSIS — E1159 Type 2 diabetes mellitus with other circulatory complications: Secondary | ICD-10-CM

## 2024-02-15 DIAGNOSIS — R002 Palpitations: Secondary | ICD-10-CM

## 2024-02-16 LAB — BASIC METABOLIC PANEL WITH GFR
BUN/Creatinine Ratio: 19 (ref 9–23)
BUN: 13 mg/dL (ref 6–24)
CO2: 20 mmol/L (ref 20–29)
Calcium: 9.1 mg/dL (ref 8.7–10.2)
Chloride: 106 mmol/L (ref 96–106)
Creatinine, Ser: 0.68 mg/dL (ref 0.57–1.00)
Glucose: 139 mg/dL — ABNORMAL HIGH (ref 70–99)
Potassium: 4.3 mmol/L (ref 3.5–5.2)
Sodium: 140 mmol/L (ref 134–144)
eGFR: 102 mL/min/{1.73_m2} (ref >59)

## 2024-02-16 LAB — LIPID PANEL
Chol/HDL Ratio: 5.4 ratio — ABNORMAL HIGH (ref 0.0–4.4)
Cholesterol, Total: 211 mg/dL — ABNORMAL HIGH (ref 100–199)
HDL: 39 mg/dL — ABNORMAL LOW (ref >39)
LDL Chol Calc (NIH): 158 mg/dL — ABNORMAL HIGH (ref 0–99)
Triglycerides: 80 mg/dL (ref 0–149)
VLDL Cholesterol Cal: 14 mg/dL (ref 5–40)

## 2024-02-16 LAB — THYROID PANEL WITH TSH
Free Thyroxine Index: 1.5 (ref 1.2–4.9)
T3 Uptake Ratio: 23 % — ABNORMAL LOW (ref 24–39)
T4, Total: 6.6 ug/dL (ref 4.5–12.0)
TSH: 0.036 u[IU]/mL — ABNORMAL LOW (ref 0.450–4.500)

## 2024-02-16 LAB — MICROALBUMIN / CREATININE URINE RATIO
Creatinine, Urine: 93.5 mg/dL
Microalb/Creat Ratio: 22 mg/g{creat} (ref 0–29)
Microalbumin, Urine: 20.8 ug/mL

## 2024-02-16 LAB — THYROTROPIN RECEPTOR AUTOABS: Thyrotropin Receptor Ab: 4.26 IU/L — ABNORMAL HIGH (ref 0.00–1.75)

## 2024-02-17 LAB — LIPID PANEL

## 2024-02-17 LAB — BASIC METABOLIC PANEL WITH GFR

## 2024-02-17 LAB — MICROALBUMIN / CREATININE URINE RATIO

## 2024-02-17 LAB — THYROID PANEL WITH TSH

## 2024-02-17 LAB — THYROTROPIN RECEPTOR AUTOABS

## 2024-02-18 ENCOUNTER — Ambulatory Visit: Payer: Self-pay | Admitting: Family Medicine

## 2024-02-18 DIAGNOSIS — R7989 Other specified abnormal findings of blood chemistry: Secondary | ICD-10-CM

## 2024-02-18 MED ORDER — EZETIMIBE 10 MG PO TABS: 10.0000 mg | ORAL_TABLET | Freq: Every day | ORAL | 3 refills | Status: AC

## 2024-02-23 ENCOUNTER — Ambulatory Visit
Admission: RE | Admit: 2024-02-23 | Discharge: 2024-02-23 | Disposition: A | Discharge status: Home / Self Care | Source: Ambulatory Visit | Attending: Family Medicine | Admitting: Family Medicine

## 2024-02-23 DIAGNOSIS — Z1231 Encounter for screening mammogram for malignant neoplasm of breast: Secondary | ICD-10-CM

## 2024-03-08 MED ORDER — METHIMAZOLE 5 MG PO TABS: 5.0000 mg | ORAL_TABLET | Freq: Every day | ORAL | 0 refills | Status: DC

## 2024-03-27 ENCOUNTER — Other Ambulatory Visit: Payer: Self-pay | Admitting: Cardiology

## 2024-05-11 ENCOUNTER — Ambulatory Visit (INDEPENDENT_AMBULATORY_CARE_PROVIDER_SITE_OTHER)

## 2024-05-11 VITALS — BP 142/80 | HR 79 | Ht 64.0 in | Wt 201.0 lb

## 2024-05-11 DIAGNOSIS — E1159 Type 2 diabetes mellitus with other circulatory complications: Secondary | ICD-10-CM

## 2024-05-11 DIAGNOSIS — R7989 Other specified abnormal findings of blood chemistry: Secondary | ICD-10-CM | POA: Diagnosis not present

## 2024-05-11 DIAGNOSIS — F411 Generalized anxiety disorder: Secondary | ICD-10-CM

## 2024-05-11 DIAGNOSIS — I152 Hypertension secondary to endocrine disorders: Secondary | ICD-10-CM | POA: Diagnosis not present

## 2024-05-11 DIAGNOSIS — R002 Palpitations: Secondary | ICD-10-CM | POA: Diagnosis not present

## 2024-05-11 DIAGNOSIS — E7800 Pure hypercholesterolemia, unspecified: Secondary | ICD-10-CM

## 2024-05-11 NOTE — Patient Instructions (Signed)
 Discussed medications for thyroid  and cholesterol. Encourage followup with endocrine. Continue current medications. Check BP and send message if consistently over 140/80

## 2024-05-11 NOTE — Assessment & Plan Note (Signed)
 Slightly elevated today. Has home BP cuff. Encouraged to use a few times per week and call if consistently >140/80.

## 2024-05-11 NOTE — Assessment & Plan Note (Signed)
-   Encouraged to continue statin and zetia . Discussed risk of heart attack and stroke, discussed side effects of zetia .

## 2024-05-11 NOTE — Assessment & Plan Note (Addendum)
 Likely related to anxiety and possible graves disease. She is not currently taking coreg  due to medication anxiety. Discussed coreg  is to help with palpitations and side effect profile of the medication. Given her anxiety about new meds, discussed that the methimazole  and zetia  are likely more impactful right now and we may hold the coreg  for now until she is comfortable adding this medication on.

## 2024-05-11 NOTE — Assessment & Plan Note (Signed)
 Pt has not started methimazole  at this time. She continues to have palpitations and hot flashes, although improved over past 3 weeks. She does have appt with endo next month. We discussed the symptoms of hyperthyroid and how they impact her other medical conditions. Discussed benefits of methimazole  and side effects.

## 2024-05-11 NOTE — Progress Notes (Signed)
    SUBJECTIVE:   CHIEF COMPLAINT / HPI:    HTN BP Readings from Last 5 Encounters:  05/11/24 (!) 142/80  02/11/24 138/82  12/24/23 (!) 160/96  08/18/23 132/84  04/30/23 (!) 140/90  - losartan  100mg  daily  HLD - lipitor 80mg  - Was instructed to start zetia  10mg  but she has not started. Also was given coreg  by cardiology but she has not started taking it. She has anxiety surrounding new medications.   Decreased TSH - ?hx of graves of disease. Has endo appt in Oct - Has not taken methimazole  as she has anxiety around new medications.  - Has palpitations, hot flashes, denies weight loss. Some blurry vision. No LE edema     PERTINENT  PMH / PSH: as above  OBJECTIVE:   BP (!) 142/80   Pulse 79   Ht 5' 4 (1.626 m)   Wt 201 lb (91.2 kg)   SpO2 99%   BMI 34.50 kg/m      Physical Exam General: Alert, conversant, cooperative. No acute distress.  HEENT: PERRL. EOMI. MMM.  Cardiovascular: RRR Respiratory: Lungs CTAB. Normal work of breathing. Abdomen: Non distended Extremities: No cyanosis. No edema Musculoskeletal: No gross deformities.  Skin: Warm. Dry. No rashes. No icterus.  Neurologic: No focal deficits. Moving all extremities. Psychiatric: Cooperative. Appropriate mood. Appropriate affect.   ASSESSMENT/PLAN:   Assessment & Plan HYPERCHOLESTEROLEMIA - Encouraged to continue statin and zetia . Discussed risk of heart attack and stroke, discussed side effects of zetia .  Low TSH level Pt has not started methimazole  at this time. She continues to have palpitations and hot flashes, although improved over past 3 weeks. She does have appt with endo next month. We discussed the symptoms of hyperthyroid and how they impact her other medical conditions. Discussed benefits of methimazole  and side effects.  Palpitations Likely related to anxiety and possible graves disease. She is not currently taking coreg  due to medication anxiety. Discussed coreg  is to help with  palpitations and side effect profile of the medication. Given her anxiety about new meds, discussed that the methimazole  and zetia  are likely more impactful right now and we may hold the coreg  for now until she is comfortable adding this medication on.  Hypertension associated with diabetes (HCC) Slightly elevated today. Has home BP cuff. Encouraged to use a few times per week and call if consistently >140/80.  GAD (generalized anxiety disorder) Pt admits to anxiety around medications. Taking medications for anxiety becomes even more difficult. Continue to offer support and discussion of medications and health status.      Haley LITTIE Deed, MD [facility]

## 2024-06-01 ENCOUNTER — Ambulatory Visit: Admitting: "Endocrinology

## 2024-06-02 ENCOUNTER — Ambulatory Visit

## 2024-06-02 VITALS — BP 132/76 | HR 78 | Ht 64.0 in | Wt 199.8 lb

## 2024-06-02 DIAGNOSIS — B029 Zoster without complications: Secondary | ICD-10-CM

## 2024-06-02 MED ORDER — GABAPENTIN 100 MG PO CAPS: 100.0000 mg | ORAL_CAPSULE | Freq: Two times a day (BID) | ORAL | 0 refills | Status: AC

## 2024-06-02 NOTE — Progress Notes (Signed)
    SUBJECTIVE:   CHIEF COMPLAINT / HPI:   Shingles - R sided abdominal pain, rash appeared a few days later - ED gave her valtrex on 9/27 - No decreased PO intake - Increased HA, cold symptoms - Denies fever or chills  PERTINENT  PMH / PSH: Graves, HTN  OBJECTIVE:   BP 132/76   Pulse 78   Ht 5' 4 (1.626 m)   Wt 199 lb 12.8 oz (90.6 kg)   SpO2 100%   BMI 34.30 kg/m    Physical Exam General: Alert, conversant, cooperative. No acute distress.  Extremities: No cyanosis. No edema Musculoskeletal: No gross deformities.  Skin: Warm. Dry. Vesicles on R abdomen and side in dermatome pattern Neurologic: No focal deficits. Moving all extremities. Psychiatric: Cooperative. Appropriate mood. Appropriate affect.   ASSESSMENT/PLAN:   Assessment & Plan Herpes zoster without complication Already treated with valtrex. Will give gabapentin  for persistent pain. No eye involvement, no systemic symptoms.      Milda LITTIE Deed, MD [facility]

## 2024-06-12 ENCOUNTER — Other Ambulatory Visit: Payer: Self-pay | Admitting: Family Medicine

## 2024-07-12 ENCOUNTER — Encounter: Payer: Self-pay | Admitting: Nurse Practitioner

## 2024-07-12 ENCOUNTER — Ambulatory Visit: Attending: Nurse Practitioner | Admitting: Nurse Practitioner

## 2024-07-12 VITALS — BP 120/90 | HR 89 | Ht 64.0 in | Wt 200.0 lb

## 2024-07-12 DIAGNOSIS — I471 Supraventricular tachycardia, unspecified: Secondary | ICD-10-CM | POA: Insufficient documentation

## 2024-07-12 DIAGNOSIS — I10 Essential (primary) hypertension: Secondary | ICD-10-CM | POA: Diagnosis present

## 2024-07-12 DIAGNOSIS — Z79899 Other long term (current) drug therapy: Secondary | ICD-10-CM | POA: Diagnosis present

## 2024-07-12 DIAGNOSIS — E782 Mixed hyperlipidemia: Secondary | ICD-10-CM | POA: Insufficient documentation

## 2024-07-12 NOTE — Patient Instructions (Signed)
 Medication Instructions:  Your physician recommends that you continue on your current medications as directed. Please refer to the Current Medication list given to you today.    [employer]  for possible prescription - this will be managed through MyChart  *If you need a refill on your cardiac medications before your next appointment, please call your pharmacy*  Lab Work: Your provider would like for you to have following labs drawn today Fasting Lipids and liver function test.   If you have labs (blood work) drawn today and your tests are completely normal, you will receive your results only by: MyChart Message (if you have MyChart) OR A paper copy in the mail If you have any lab test that is abnormal or we need to change your treatment, we will call you to review the results.  Follow-Up: At [facility], you and your health needs are our priority.  As part of our continuing mission to provide you with exceptional heart care, our providers are all part of one team.  This team includes your primary Cardiologist (physician) and Advanced Practice Providers or APPs (Physician Assistants and Nurse Practitioners) who all work together to provide you with the care you need, when you need it.  Your next appointment:   1 year(s)  Provider:   You may see [name], MD or one of the following Advanced Practice Providers on your designated Care Team:   Lonni Meager, NP Lesley Maffucci, PA-C Bernardino Bring, PA-C Cadence Liverpool, PA-C Tylene Lunch, NP Barnie Hila, NP

## 2024-07-12 NOTE — Progress Notes (Signed)
 Office Visit    Patient Name: Haley Mclaughlin Date of Encounter: 07/12/2024  Primary Care Provider:  [name], MD Primary Cardiologist:  [name], MD    Chief Complaint    58 y.o. female  with a history of chest pain, dyspnea, hypertension, hyperlipidemia, diabetes, palpitations, family history of premature CAD, and obesity, presents for follow-up related to HTN and palpitations.   Past Medical History   Subjective   Past Medical History:  Diagnosis Date   Abnormal Pap smear    Atherosclerosis of aorta 07/21/2021   Dyspnea on exertion    a. 07/2021 Echo: EF of 60 to 65% without regional wall motion abnormalities, mild LVH, and trivial MR; b. 08/2021 ETT: Ex time 11m 26s. No ST/T changes ->low risk.   Gestational diabetes    diet controlled with #3   Hyperlipemia    Hyperlipidemia    Hypertension    PID (pelvic inflammatory disease)    Preterm labor    with last 4 preg, del at term   PSVT (paroxysmal supraventricular tachycardia)    a. 01/2023 Zio: predominantly sinus rhythm at 84 bpm with 3 brief runs of SVT (up to 14 beats at 158 bpm) and rare PACs/PVCs.  Triggered events were associated with SVT.   Sarcoidosis    Urinary tract infection    Past Surgical History:  Procedure Laterality Date   APPENDECTOMY     INDUCED ABORTION      Allergies  Allergies  Allergen Reactions   Sulfamethoxazole-Trimethoprim Rash and Other (See Comments)    Feels shaky also Feels shaky Other reaction(s): Other (See Comments) Feels shaky Feels shaky also Feels shaky Other reaction(s): Other (See Comments) Feels shaky Feels shaky also Feels shaky Other reaction(s): Other (See Comments) Feels shaky Feels shaky Other reaction(s): Other (See Comments) Feels shaky Feels shaky also        History of Present Illness      58 y.o. y/o female with a history of chest pain, dyspnea, hypertension, hyperlipidemia, diabetes, palpitations,  family history of premature CAD (mother had an MI at age 59), and obesity.  She previously established care with Dr. Cave in December 2022 in the setting of dyspnea on exertion.  Echocardiogram in December 2022 showed an EF of 60 to 65% without regional wall motion abnormalities, mild LVH, and trivial MR.  Coronary CTA was ordered but heart rates were too elevated to perform.  Lexiscan Myoview was offered but patient declined.  She subsequently underwent exercise treadmill testing in January 2023, which showed no evidence of ST/T changes, with fair exercise tolerance, walking 6 minutes 26 seconds (7.6 METS).  In June 2024, Ms. Haley Mclaughlin complained of palpitations.  Event monitoring showed predominantly sinus rhythm at 84 bpm with 3 brief runs of SVT (up to 14 beats at 158 bpm) and rare PACs/PVCs.  Triggered events were associated with SVT.  She continued to have palpitations at follow-up visit in July 2024 and declined initiation of an AV nodal blocking agent, though carvedilol  was later added in the setting of palpitations and HTN in 11/2023.  She ended up discontinuing carvedilol  after a few weeks due to his profound fatigue.  She was subsequently diagnosed with hyperthyroidism and since being on methimazole , she has noted improvement in palpitations.     In September 2025, she was seen in the emergency department with abdominal pain and rash and diagnosed with shingles.  She was placed on Valtrex and subsequently gabapentin  with resolution of symptoms.  From a cardiac standpoint, she has done reasonably well.  She does not experience chest pain or dyspnea and as noted above, palpitations have improved on methimazole  therapy.  She denies PND, orthopnea, dizziness, syncope, edema, or early satiety.  LDL remained elevated at 158 in June 2025 and she was placed on Zetia  10 mg daily.  She has tolerated this.  She would be interested in taking Repatha  if necessary. Objective   Home Medications    Prior to  Admission medications   Medication Sig Start Date End Date Taking? Authorizing Provider  acetaminophen  (TYLENOL ) 325 MG tablet Take 650 mg by mouth every 6 (six) hours as needed.    [provider]  atorvastatin  (LIPITOR) 80 MG tablet TAKE 1 TABLET BY MOUTH EVERY DAY 10/14/23   Rumball, Alison M, DO  carvedilol  (COREG ) 3.125 MG tablet Take 1 tablet (3.125 mg total) by mouth 2 (two) times daily. Patient not taking: Reported on 07/12/2024 12/24/23 03/23/24  Vivienne Lonni Ingle, NP  ezetimibe  (ZETIA ) 10 MG tablet Take 1 tablet (10 mg total) by mouth daily. 02/18/24   Rumball, Alison M, DO  gabapentin  (NEURONTIN ) 100 MG capsule Take 1 capsule (100 mg total) by mouth 2 (two) times daily. Patient not taking: Reported on 07/12/2024 06/02/24   Pruett, Milda CROME, MD  hydrocortisone  2.5 % cream Apply topically 2 (two) times daily. 10/06/21   Jeanelle Layman CROME, MD  losartan  (COZAAR ) 100 MG tablet TAKE 1 TABLET BY MOUTH EVERY DAY 03/28/24   [name], MD  methimazole  (TAPAZOLE ) 5 MG tablet TAKE 1 TABLET (5 MG TOTAL) BY MOUTH DAILY. 06/12/24   Pruett, Milda CROME, MD  Multiple Vitamins-Minerals (MULTIVITAMIN ADULTS 50+) TABS Take 1 tablet by mouth daily.    [provider]  omeprazole  (PRILOSEC) 40 MG capsule TAKE 1 CAPSULE BY MOUTH EVERY DAY AS NEEDED 03/18/23   Delores Suzann HERO, MD       Physical Exam    VS:  BP (!) 120/90 (BP Location: Left Arm, Patient Position: Sitting, Cuff Size: Large)   Pulse 89   Ht 5' 4 (1.626 m)   Wt 200 lb (90.7 kg)   SpO2 97%   BMI 34.33 kg/m  , BMI Body mass index is 34.33 kg/m.          GEN: Well nourished, well developed, in no acute distress. HEENT: normal. Neck: Supple, no JVD, carotid bruits, or masses. Cardiac: RRR, no murmurs, rubs, or gallops. No clubbing, cyanosis, edema.  Radials 2+/PT 2+ and equal bilaterally.  Respiratory:  Respirations regular and unlabored, clear to auscultation bilaterally. GI: Soft, nontender,  nondistended, BS + x 4. MS: no deformity or atrophy. Skin: warm and dry, no rash. Neuro:  Strength and sensation are intact. Psych: Normal affect.  Accessory Clinical Findings    ECG personally reviewed by me today - EKG Interpretation Date/Time:  Wednesday July 12 2024 10:03:02 EST Ventricular Rate:  89 PR Interval:  144 QRS Duration:  76 QT Interval:  352 QTC Calculation: 428 R Axis:   3  Text Interpretation: Normal sinus rhythm Low voltage QRS Confirmed by Vivienne Lonni [phone]) on 07/12/2024 10:09:17 AM  - no acute changes.  Lab Results  Component Value Date   WBC 5.6 04/30/2023   HGB 12.7 04/30/2023   HCT 38.7 04/30/2023   MCV 91 04/30/2023   PLT 263 04/30/2023   Lab Results  Component Value Date   CREATININE 0.68 02/15/2024   BUN 13 02/15/2024   NA 140 02/15/2024  K 4.3 02/15/2024   CL 106 02/15/2024   CO2 20 02/15/2024   Lab Results  Component Value Date   ALT 25 02/26/2023   AST 22 02/26/2023   ALKPHOS 63 02/26/2023   BILITOT 0.7 02/26/2023   Lab Results  Component Value Date   CHOL 211 (H) 02/15/2024   HDL 39 (L) 02/15/2024   LDLCALC 158 (H) 02/15/2024   TRIG 80 02/15/2024   CHOLHDL 5.4 (H) 02/15/2024    Lab Results  Component Value Date   HGBA1C 6.9 02/11/2024   Lab Results  Component Value Date   TSH 0.036 (L) 02/15/2024       Assessment & Plan    1.  Hyperlipidemia/aortic atherosclerosis: Patient with total cholesterol is in the upper 300s (371 in 2022), now on atorvastatin  80 mg and Zetia  10 mg.  The latter was started in June in the setting of an LDL of 158.  Patient with family history of premature CAD as well as aortic atherosclerosis previously noted on CT.  We discussed the importance of aggressive lipid management.  I will follow-up lipids and LFTs today as she has been on Zetia  for several months and if LDL remains greater than 70, we will look to add Repatha .  We did discuss potentially referring to lipid clinic in  [place] though she prefers to be followed here if feasible.  She does indicate that her 3 children have tested positive for familial hyperlipidemia and will be seeing Dr. Mona in the future.  2.  Primary hypertension: Blood pressure elevated at last office visit prompting initiation of carvedilol  however, patient did not tolerate it and subsequently discontinued on her own.  Diastolic pressure is elevated today at 90 with normal systolic of 120.  She remains on losartan  therapy.  No change today.  She will follow-up at home.  3.  Palpitations/PSVT: Monitoring in June 2024 showed 3 brief runs of SVT up to 14 beats at 158 bpm.  Previously tried carvedilol  but caused fatigue.  Subsequently diagnosed with hyperthyroidism and has been on methimazole  with relative resolution of palpitations.  4.  Type 2 diabetes mellitus: A1c of 6.9 in June.  She is currently diet controlled.    5.  Disposition: Follow-up lipids and LFTs.  Follow-up in clinic in 1 year or sooner if necessary.  Lonni Meager, NP 07/12/2024, 10:28 AM

## 2024-07-13 ENCOUNTER — Ambulatory Visit: Payer: Self-pay | Admitting: Nurse Practitioner

## 2024-07-13 DIAGNOSIS — E785 Hyperlipidemia, unspecified: Secondary | ICD-10-CM

## 2024-07-13 LAB — LIPID PANEL
Chol/HDL Ratio: 5.8 ratio — ABNORMAL HIGH (ref 0.0–4.4)
Cholesterol, Total: 213 mg/dL — ABNORMAL HIGH (ref 100–199)
HDL: 37 mg/dL — ABNORMAL LOW (ref >39)
LDL Chol Calc (NIH): 158 mg/dL — ABNORMAL HIGH (ref 0–99)
Triglycerides: 100 mg/dL (ref 0–149)
VLDL Cholesterol Cal: 18 mg/dL (ref 5–40)

## 2024-07-13 LAB — HEPATIC FUNCTION PANEL
ALT: 22 IU/L (ref 0–32)
AST: 20 IU/L (ref 0–40)
Albumin: 4.2 g/dL (ref 3.8–4.9)
Alkaline Phosphatase: 73 IU/L (ref 49–135)
Bilirubin Total: 0.6 mg/dL (ref 0.0–1.2)
Bilirubin, Direct: 0.16 mg/dL (ref 0.00–0.40)
Total Protein: 7.1 g/dL (ref 6.0–8.5)

## 2024-07-26 ENCOUNTER — Encounter: Payer: Self-pay | Admitting: "Endocrinology

## 2024-07-26 ENCOUNTER — Other Ambulatory Visit

## 2024-07-26 ENCOUNTER — Ambulatory Visit: Admitting: "Endocrinology

## 2024-07-26 VITALS — BP 114/80 | HR 86 | Ht 64.0 in | Wt 199.0 lb

## 2024-07-26 DIAGNOSIS — E0500 Thyrotoxicosis with diffuse goiter without thyrotoxic crisis or storm: Secondary | ICD-10-CM | POA: Diagnosis not present

## 2024-07-26 NOTE — Patient Instructions (Signed)
  If you notice any symptoms of worsening fatigue, fever with sore throat, loss of appetite, yellowing of eyes, dark urine, joint pains, sores in the mouth, itchy rash, light colored stools or abdominal pain, please stop the medication and call us immediately as this can be a serious side effect of the medication.

## 2024-07-26 NOTE — Progress Notes (Signed)
 Outpatient Endocrinology Note Obadiah Birmingham, MD  07/26/24   Haley Mclaughlin 13-Aug-1966 [id number]  Referring Provider: Madelon Donald HERO, DO Primary Care Provider: [name], MD Subjective  No chief complaint on file.   Assessment & Plan  Diagnoses and all orders for this visit:  Graves disease -     TSH -     T3, free -     T4, free -     Thyroid  stimulating immunoglobulin -     CBC with Differential/Platelet   TAKEYSHA BONK is currently taking methimazole  5 mg qd. TRAb+ Patient was biochemically hyperthyroid on last check on 02/11/24.  Discussed the etiology for hyperthyroidism. Educated on thyroid  axis.  Recommend the following: Take methimazole  5 mg mg a day.  Labs today Repeat labs in 3 months or sooner if symptoms of hyper or hypothyroidism develop.  Definitive options of treatment include RAI therapy and surgery.  No indication of these at this time. -Complications of untreated hyperthyroidism including atrial fibrillation, heart failure and osteoporosis - Discussed side effects of Methimazole  including but not limited to allergic reaction, rash, bone marrow suppression and liver dysfunction  If you notice any symptoms of worsening fatigue, fever with sore throat, loss of appetite, yellowing of eyes, dark urine, joint pains, sores in the mouth, itchy rash, light colored stools or abdominal pain, please stop the medication and call us  immediately as this can be a serious side effect of the medication.   I have reviewed current medications, nurse's notes, allergies, vital signs, past medical and surgical history, family medical history, and social history for this encounter. Counseled patient on symptoms, examination findings, lab findings, imaging results, treatment decisions and monitoring and prognosis. The patient understood the recommendations and agrees with the treatment plan. All questions regarding treatment plan were fully answered.   Return  in about 22 days (around 08/17/2024) for video visit, labs today.   Obadiah Birmingham, MD  07/26/24   I have reviewed current medications, nurse's notes, allergies, vital signs, past medical and surgical history, family medical history, and social history for this encounter. Counseled patient on symptoms, examination findings, lab findings, imaging results, treatment decisions and monitoring and prognosis. The patient understood the recommendations and agrees with the treatment plan. All questions regarding treatment plan were fully answered.   History of Present Illness Haley Mclaughlin is a 58 y.o. year old female who presents to our clinic with Grave's disease diagnosed in 2025.    Diagnosed of Graves' disease around 02/2024, with symptoms of palpitations, hyperdefecation, tremors, anxiety, heat intolerance.    Symptoms suggestive of HYPOTHYROIDISM:  fatigue Yes weight gain No cold intolerance  No constipation  Yes, has IBS  Symptoms suggestive of HYPERTHYROIDISM:  weight loss  No heat intolerance Yes-has hot flashes, improved hyperdefecation  Yes palpitations  Yes, improved   Compressive symptoms:  dysphagia  No dysphonia  No positional dyspnea (especially with simultaneous arms elevation)  No  Smokes  No On biotin  No Personal history of head/neck surgery/irradiation  No  Adverse Drug Effects from Methimazole  (MMI): rash No fever No throat pain No arthritis No mouth ulcers No jaundice No loss of appetite No lymphadenopathy No  Grave's Ophthalmopathy Clinical Activity Score: eyes water and become itchy - saw ophthalmologist   Physical Exam  BP 114/80   Pulse 86   Ht 5' 4 (1.626 m)   Wt 199 lb (90.3 kg)   SpO2 97%   BMI 34.16 kg/m  Constitutional:  well developed, well nourished Head: normocephalic, atraumatic, no exophthalmos Eyes: sclera anicteric, no redness Neck: non thyromegaly, no thyroid  tenderness; no nodules palpated Lungs: normal respiratory  effort Neurology: alert and oriented, no fine hand tremor Skin: dry, no appreciable rashes Musculoskeletal: no appreciable defects Psychiatric: normal mood and affect  Allergies Allergies  Allergen Reactions   Sulfamethoxazole-Trimethoprim Rash and Other (See Comments)    Feels shaky also Feels shaky Other reaction(s): Other (See Comments) Feels shaky Feels shaky also Feels shaky Other reaction(s): Other (See Comments) Feels shaky Feels shaky also Feels shaky Other reaction(s): Other (See Comments) Feels shaky Feels shaky Other reaction(s): Other (See Comments) Feels shaky Feels shaky also     Current Medications Patient's Medications  New Prescriptions   No medications on file  Previous Medications   ACETAMINOPHEN  (TYLENOL ) 325 MG TABLET    Take 650 mg by mouth every 6 (six) hours as needed.   ATORVASTATIN  (LIPITOR) 80 MG TABLET    TAKE 1 TABLET BY MOUTH EVERY DAY   CARVEDILOL  (COREG ) 3.125 MG TABLET    Take 1 tablet (3.125 mg total) by mouth 2 (two) times daily.   EZETIMIBE  (ZETIA ) 10 MG TABLET    Take 1 tablet (10 mg total) by mouth daily.   GABAPENTIN  (NEURONTIN ) 100 MG CAPSULE    Take 1 capsule (100 mg total) by mouth 2 (two) times daily.   HYDROCORTISONE  2.5 % CREAM    Apply topically 2 (two) times daily.   LOSARTAN  (COZAAR ) 100 MG TABLET    TAKE 1 TABLET BY MOUTH EVERY DAY   METHIMAZOLE  (TAPAZOLE ) 5 MG TABLET    TAKE 1 TABLET (5 MG TOTAL) BY MOUTH DAILY.   MULTIPLE VITAMINS-MINERALS (MULTIVITAMIN ADULTS 50+) TABS    Take 1 tablet by mouth daily.   OMEPRAZOLE  (PRILOSEC) 40 MG CAPSULE    TAKE 1 CAPSULE BY MOUTH EVERY DAY AS NEEDED  Modified Medications   No medications on file  Discontinued Medications   No medications on file    Past Medical History Past Medical History:  Diagnosis Date   Abnormal Pap smear    Atherosclerosis of aorta 07/21/2021   Dyspnea on exertion    a. 07/2021 Echo: EF of 60 to 65% without regional wall motion  abnormalities, mild LVH, and trivial MR; b. 08/2021 ETT: Ex time 16m 26s. No ST/T changes ->low risk.   Gestational diabetes    diet controlled with #3   Hyperlipemia    Hyperlipidemia    Hypertension    PID (pelvic inflammatory disease)    Preterm labor    with last 4 preg, del at term   PSVT (paroxysmal supraventricular tachycardia)    a. 01/2023 Zio: predominantly sinus rhythm at 84 bpm with 3 brief runs of SVT (up to 14 beats at 158 bpm) and rare PACs/PVCs.  Triggered events were associated with SVT.   Sarcoidosis    Urinary tract infection     Past Surgical History Past Surgical History:  Procedure Laterality Date   APPENDECTOMY     INDUCED ABORTION      Family History family history includes Diabetes in her father; Heart disease in her father and mother; Hypertension in her mother and sister.  Social History Social History   Socioeconomic History   Marital status: Married    Spouse name: Not on file   Number of children: Not on file   Years of education: Not on file   Highest education level: GED or equivalent  Occupational History   Not  on file  Tobacco Use   Smoking status: Never   Smokeless tobacco: Never  Vaping Use   Vaping status: Never Used  Substance and Sexual Activity   Alcohol use: No   Drug use: No   Sexual activity: Yes    Birth control/protection: Condom    Comment: IUD removed 06/17/2011  Other Topics Concern   Not on file  Social History Narrative   Not on file   Social Drivers of Health   Financial Resource Strain: Low Risk  (08/17/2023)   Overall Financial Resource Strain (CARDIA)    Difficulty of Paying Living Expenses: Not very hard  Food Insecurity: No Food Insecurity (08/17/2023)   Hunger Vital Sign    Worried About Running Out of Food in the Last Year: Never true    Ran Out of Food in the Last Year: Never true  Transportation Needs: No Transportation Needs (08/17/2023)   PRAPARE - Administrator, Civil Service  (Medical): No    Lack of Transportation (Non-Medical): No  Physical Activity: Sufficiently Active (08/17/2023)   Exercise Vital Sign    Days of Exercise per Week: 5 days    Minutes of Exercise per Session: 140 min  Stress: No Stress Concern Present (08/17/2023)   [employer] of Occupational Health - Occupational Stress Questionnaire    Feeling of Stress : Only a little  Social Connections: Moderately Integrated (08/17/2023)   Social Connection and Isolation Panel    Frequency of Communication with Friends and Family: More than three times a week    Frequency of Social Gatherings with Friends and Family: More than three times a week    Attends Religious Services: More than 4 times per year    Active Member of Clubs or Organizations: Yes    Attends Engineer, Structural: More than 4 times per year    Marital Status: Separated  Intimate Partner Violence: Not on file    Laboratory Investigations Lab Results  Component Value Date   TSH 0.036 (L) 02/15/2024   TSH CANCELED 02/11/2024   TSH 0.082 (L) 08/18/2023   FREET4 1.27 08/18/2023   FREET4 1.30 05/14/2023   FREET4 1.04 04/05/2023     No results found for: TSI   No components found for: TRAB   Lab Results  Component Value Date   CHOL 213 (H) 07/12/2024   Lab Results  Component Value Date   HDL 37 (L) 07/12/2024   Lab Results  Component Value Date   LDLCALC 158 (H) 07/12/2024   Lab Results  Component Value Date   TRIG 100 07/12/2024   Lab Results  Component Value Date   CHOLHDL 5.8 (H) 07/12/2024   Lab Results  Component Value Date   CREATININE 0.68 02/15/2024   No results found for: GFR    Component Value Date/Time   NA 140 02/15/2024 0845   NA 140 07/10/2014 1700   K 4.3 02/15/2024 0845   K 3.8 07/10/2014 1700   CL 106 02/15/2024 0845   CL 106 07/10/2014 1700   CO2 20 02/15/2024 0845   CO2 26 07/10/2014 1700   GLUCOSE 139 (H) 02/15/2024 0845   GLUCOSE 155 (H) 04/06/2021 1650    GLUCOSE 93 07/10/2014 1700   BUN 13 02/15/2024 0845   BUN 8 07/10/2014 1700   CREATININE 0.68 02/15/2024 0845   CREATININE 0.49 (L) 07/10/2014 1700   CREATININE 0.74 03/09/2014 1428   CALCIUM  9.1 02/15/2024 0845   CALCIUM  8.4 (L) 07/10/2014 1700  PROT 7.1 07/12/2024 1124   PROT 8.0 07/10/2014 1700   ALBUMIN 4.2 07/12/2024 1124   ALBUMIN 3.8 07/10/2014 1700   AST 20 07/12/2024 1124   AST 29 07/10/2014 1700   ALT 22 07/12/2024 1124   ALT 25 07/10/2014 1700   ALKPHOS 73 07/12/2024 1124   ALKPHOS 66 07/10/2014 1700   BILITOT 0.6 07/12/2024 1124   BILITOT 0.5 07/10/2014 1700   GFRNONAA >60 04/06/2021 1650   GFRNONAA >60 07/10/2014 1700   GFRNONAA >60 02/24/2014 0131   GFRAA >60 03/22/2018 1201   GFRAA >60 07/10/2014 1700   GFRAA >60 02/24/2014 0131      Latest Ref Rng & Units 02/15/2024    8:45 AM 02/11/2024    2:09 PM 02/26/2023   12:25 PM  BMP  Glucose 70 - 99 mg/dL 860  CANCELED  871   BUN 6 - 24 mg/dL 13  CANCELED  10   Creatinine 0.57 - 1.00 mg/dL 9.31  CANCELED  9.28   BUN/Creat Ratio 9 - 23 19   14    Sodium 134 - 144 mmol/L 140  CANCELED  139   Potassium 3.5 - 5.2 mmol/L 4.3  CANCELED  4.3   Chloride 96 - 106 mmol/L 106  CANCELED  104   CO2 20 - 29 mmol/L 20  CANCELED  20   Calcium  8.7 - 10.2 mg/dL 9.1  CANCELED  9.2        Component Value Date/Time   WBC 5.6 04/30/2023 1221   WBC 7.4 04/06/2021 1650   RBC 4.26 04/30/2023 1221   RBC 4.61 04/06/2021 1650   HGB 12.7 04/30/2023 1221   HCT 38.7 04/30/2023 1221   PLT 263 04/30/2023 1221   MCV 91 04/30/2023 1221   MCV 88 12/16/2014 1745   MCH 29.8 04/30/2023 1221   MCH 29.5 04/06/2021 1650   MCHC 32.8 04/30/2023 1221   MCHC 33.7 04/06/2021 1650   RDW 12.0 04/30/2023 1221   RDW 13.0 12/16/2014 1745   LYMPHSABS 2.5 07/03/2021 1215   LYMPHSABS 4.3 (H) 07/10/2014 1700   MONOABS 0.3 04/08/2015 1500   MONOABS 0.6 07/10/2014 1700   EOSABS 0.1 07/03/2021 1215   EOSABS 0.3 07/10/2014 1700   BASOSABS 0.1 07/03/2021  1215   BASOSABS 0.1 07/10/2014 1700      Parts of this note may have been dictated using voice recognition software. There may be variances in spelling and vocabulary which are unintentional. Not all errors are proofread. Please notify the dino if any discrepancies are noted or if the meaning of any statement is not clear.

## 2024-07-31 LAB — CBC WITH DIFFERENTIAL/PLATELET
Absolute Lymphocytes: 2239 {cells}/uL (ref 850–3900)
Absolute Monocytes: 299 {cells}/uL (ref 200–950)
Basophils Absolute: 69 {cells}/uL (ref 0–200)
Basophils Relative: 1.4 %
Eosinophils Absolute: 162 {cells}/uL (ref 15–500)
Eosinophils Relative: 3.3 %
HCT: 41.6 % (ref 35.9–46.0)
Hemoglobin: 13.8 g/dL (ref 11.7–15.5)
MCH: 29.5 pg (ref 27.0–33.0)
MCHC: 33.2 g/dL (ref 31.6–35.4)
MCV: 88.9 fL (ref 81.4–101.7)
MPV: 10.9 fL (ref 7.5–12.5)
Monocytes Relative: 6.1 %
Neutro Abs: 2132 {cells}/uL (ref 1500–7800)
Neutrophils Relative %: 43.5 %
Platelets: 256 Thousand/uL (ref 140–400)
RBC: 4.68 Million/uL (ref 3.80–5.10)
RDW: 11.6 % (ref 11.0–15.0)
Total Lymphocyte: 45.7 %
WBC: 4.9 Thousand/uL (ref 3.8–10.8)

## 2024-07-31 LAB — T3, FREE: T3, Free: 4.2 pg/mL (ref 2.3–4.2)

## 2024-07-31 LAB — T4, FREE: Free T4: 1.4 ng/dL (ref 0.8–1.8)

## 2024-07-31 LAB — THYROID STIMULATING IMMUNOGLOBULIN: TSI: 137 %{baseline} (ref <140)

## 2024-07-31 LAB — TSH: TSH: 0.01 m[IU]/L — ABNORMAL LOW (ref 0.40–4.50)

## 2024-08-18 ENCOUNTER — Telehealth: Admitting: "Endocrinology

## 2024-08-18 ENCOUNTER — Other Ambulatory Visit: Payer: Self-pay | Admitting: Family Medicine

## 2024-08-18 ENCOUNTER — Encounter: Payer: Self-pay | Admitting: "Endocrinology

## 2024-08-18 VITALS — Ht 64.0 in | Wt 199.0 lb

## 2024-08-18 DIAGNOSIS — E0500 Thyrotoxicosis with diffuse goiter without thyrotoxic crisis or storm: Secondary | ICD-10-CM | POA: Diagnosis not present

## 2024-08-18 MED ORDER — METHIMAZOLE 5 MG PO TABS: 7.5000 mg | ORAL_TABLET | Freq: Every day | ORAL | 0 refills | Status: AC

## 2024-08-18 NOTE — Patient Instructions (Signed)
  If you notice any symptoms of worsening fatigue, fever with sore throat, loss of appetite, yellowing of eyes, dark urine, joint pains, sores in the mouth, itchy rash, light colored stools or abdominal pain, please stop the medication and call us immediately as this can be a serious side effect of the medication.

## 2024-08-18 NOTE — Progress Notes (Signed)
 "  The patient reports they are currently: [place]. I spent 8-9 minutes on the video with the patient on the date of service. I spent an additional 2 minutes on pre- and post-visit activities on the date of service.   The patient was physically located in [place]  or a state in which I am permitted to provide care. The patient and/or parent/guardian understood that s/he may incur co-pays and cost sharing, and agreed to the telemedicine visit. The visit was reasonable and appropriate under the circumstances given the patient's presentation at the time.  The patient and/or parent/guardian has been advised of the potential risks and limitations of this mode of treatment (including, but not limited to, the absence of in-person examination) and has agreed to be treated using telemedicine. The patient's/patient's family's questions regarding telemedicine have been answered.   The patient and/or parent/guardian has also been advised to contact their provider's office for worsening conditions, and seek emergency medical treatment and/or call 911 if the patient deems either necessary.     Outpatient Endocrinology Note Obadiah Birmingham, MD  08/18/2024   Haley Mclaughlin 20-Sep-1965 [id number]  Referring Provider: [name], MD Primary Care Provider: [name], MD Subjective  No chief complaint on file.   Assessment & Plan  Diagnoses and all orders for this visit:  Graves disease -     TSH -     T3, free -     T4, free  Other orders -     methimazole  (TAPAZOLE ) 5 MG tablet; Take 1.5 tablets (7.5 mg total) by mouth daily.    Haley Mclaughlin is currently taking methimazole  5 mg qd.  Started methimazole  around 05/2024. TRAb+ Patient was biochemically hyperthyroid on last check on 02/11/24.  Discussed the etiology for hyperthyroidism. Educated on thyroid  axis.  08/18/24: Recommend the following: Take methimazole  7.5 mg mg a day. Repeat labs in 3 months or sooner if symptoms of  hyper or hypothyroidism develop.  Definitive options of treatment include RAI therapy and surgery.  No indication of these at this time. -Complications of untreated hyperthyroidism including atrial fibrillation, heart failure and osteoporosis - Discussed side effects of Methimazole  including but not limited to allergic reaction, rash, bone marrow suppression and liver dysfunction  If you notice any symptoms of worsening fatigue, fever with sore throat, loss of appetite, yellowing of eyes, dark urine, joint pains, sores in the mouth, itchy rash, light colored stools or abdominal pain, please stop the medication and call us  immediately as this can be a serious side effect of the medication.  I have reviewed current medications, nurse's notes, allergies, vital signs, past medical and surgical history, family medical history, and social history for this encounter. Counseled patient on symptoms, examination findings, lab findings, imaging results, treatment decisions and monitoring and prognosis. The patient understood the recommendations and agrees with the treatment plan. All questions regarding treatment plan were fully answered.   Return in about 9 weeks (around 10/20/2024) for televisit + labs before next visit.   Obadiah Birmingham, MD  08/18/2024   I have reviewed current medications, nurse's notes, allergies, vital signs, past medical and surgical history, family medical history, and social history for this encounter. Counseled patient on symptoms, examination findings, lab findings, imaging results, treatment decisions and monitoring and prognosis. The patient understood the recommendations and agrees with the treatment plan. All questions regarding treatment plan were fully answered.   History of Present Illness Haley Mclaughlin is a 58 y.o. year old female  who presents to our clinic with Grave's disease diagnosed in 2025.    Diagnosed of Graves' disease around 02/2024, with symptoms of  palpitations, hyperdefecation, tremors, anxiety, heat intolerance.    Symptoms suggestive of HYPOTHYROIDISM:  fatigue No weight gain No cold intolerance  No constipation  Yes, has IBS  Symptoms suggestive of HYPERTHYROIDISM:  weight loss  No heat intolerance Yes-has hot flashes hyperdefecation  Yes palpitations  No  Compressive symptoms: keeps getting cold  dysphagia  No dysphonia  No positional dyspnea (especially with simultaneous arms elevation)  No  Smokes  No On biotin  No Personal history of head/neck surgery/irradiation  No  Adverse Drug Effects from Methimazole  (MMI): rash No fever No throat pain No arthritis No mouth ulcers No jaundice No loss of appetite No lymphadenopathy No  Grave's Ophthalmopathy Clinical Activity Score: sometimes itchy and sore eyes  - saw ophthalmologist   Physical Exam  Ht 5' 4 (1.626 m)   Wt 199 lb (90.3 kg)   BMI 34.16 kg/m  Constitutional: well developed, well nourished Head: normocephalic, atraumatic, no exophthalmos Eyes: sclera anicteric, no redness Neck: non thyromegaly, no thyroid  tenderness; no nodules palpated Lungs: normal respiratory effort Neurology: alert and oriented, no fine hand tremor Skin: dry, no appreciable rashes Musculoskeletal: no appreciable defects Psychiatric: normal mood and affect  Allergies Allergies  Allergen Reactions   Sulfamethoxazole-Trimethoprim Rash and Other (See Comments)    Feels shaky also Feels shaky Other reaction(s): Other (See Comments) Feels shaky Feels shaky also Feels shaky Other reaction(s): Other (See Comments) Feels shaky Feels shaky also Feels shaky Other reaction(s): Other (See Comments) Feels shaky Feels shaky Other reaction(s): Other (See Comments) Feels shaky Feels shaky also     Current Medications Patient's Medications  New Prescriptions   No medications on file  Previous Medications   ACETAMINOPHEN  (TYLENOL ) 325 MG TABLET     Take 650 mg by mouth every 6 (six) hours as needed.   ATORVASTATIN  (LIPITOR) 80 MG TABLET    TAKE 1 TABLET BY MOUTH EVERY DAY   CARVEDILOL  (COREG ) 3.125 MG TABLET    Take 1 tablet (3.125 mg total) by mouth 2 (two) times daily.   EZETIMIBE  (ZETIA ) 10 MG TABLET    Take 1 tablet (10 mg total) by mouth daily.   GABAPENTIN  (NEURONTIN ) 100 MG CAPSULE    Take 1 capsule (100 mg total) by mouth 2 (two) times daily.   HYDROCORTISONE  2.5 % CREAM    Apply topically 2 (two) times daily.   LOSARTAN  (COZAAR ) 100 MG TABLET    TAKE 1 TABLET BY MOUTH EVERY DAY   MULTIPLE VITAMINS-MINERALS (MULTIVITAMIN ADULTS 50+) TABS    Take 1 tablet by mouth daily.   OMEPRAZOLE  (PRILOSEC) 40 MG CAPSULE    TAKE 1 CAPSULE BY MOUTH EVERY DAY AS NEEDED  Modified Medications   Modified Medication Previous Medication   METHIMAZOLE  (TAPAZOLE ) 5 MG TABLET methimazole  (TAPAZOLE ) 5 MG tablet      Take 1.5 tablets (7.5 mg total) by mouth daily.    TAKE 1 TABLET (5 MG TOTAL) BY MOUTH DAILY.  Discontinued Medications   No medications on file    Past Medical History Past Medical History:  Diagnosis Date   Abnormal Pap smear    Atherosclerosis of aorta 07/21/2021   Dyspnea on exertion    a. 07/2021 Echo: EF of 60 to 65% without regional wall motion abnormalities, mild LVH, and trivial MR; b. 08/2021 ETT: Ex time 71m 26s. No ST/T changes ->low risk.  Gestational diabetes    diet controlled with #3   Hyperlipemia    Hyperlipidemia    Hypertension    PID (pelvic inflammatory disease)    Preterm labor    with last 4 preg, del at term   PSVT (paroxysmal supraventricular tachycardia)    a. 01/2023 Zio: predominantly sinus rhythm at 84 bpm with 3 brief runs of SVT (up to 14 beats at 158 bpm) and rare PACs/PVCs.  Triggered events were associated with SVT.   Sarcoidosis    Urinary tract infection     Past Surgical History Past Surgical History:  Procedure Laterality Date   APPENDECTOMY     INDUCED ABORTION      Family  History family history includes Diabetes in her father; Heart disease in her father and mother; Hypertension in her mother and sister.  Social History Social History   Socioeconomic History   Marital status: Married    Spouse name: Not on file   Number of children: Not on file   Years of education: Not on file   Highest education level: GED or equivalent  Occupational History   Not on file  Tobacco Use   Smoking status: Never   Smokeless tobacco: Never  Vaping Use   Vaping status: Never Used  Substance and Sexual Activity   Alcohol use: No   Drug use: No   Sexual activity: Yes    Birth control/protection: Condom    Comment: IUD removed 06/17/2011  Other Topics Concern   Not on file  Social History Narrative   Not on file   Social Drivers of Health   Tobacco Use: Low Risk (08/18/2024)   Patient History    Smoking Tobacco Use: Never    Smokeless Tobacco Use: Never    Passive Exposure: Not on file  Financial Resource Strain: Low Risk (08/17/2023)   Overall Financial Resource Strain (CARDIA)    Difficulty of Paying Living Expenses: Not very hard  Food Insecurity: No Food Insecurity (08/17/2023)   Hunger Vital Sign    Worried About Running Out of Food in the Last Year: Never true    Ran Out of Food in the Last Year: Never true  Transportation Needs: No Transportation Needs (08/17/2023)   PRAPARE - Administrator, Civil Service (Medical): No    Lack of Transportation (Non-Medical): No  Physical Activity: Sufficiently Active (08/17/2023)   Exercise Vital Sign    Days of Exercise per Week: 5 days    Minutes of Exercise per Session: 140 min  Stress: No Stress Concern Present (08/17/2023)   [employer] of Occupational Health - Occupational Stress Questionnaire    Feeling of Stress : Only a little  Social Connections: Moderately Integrated (08/17/2023)   Social Connection and Isolation Panel    Frequency of Communication with Friends and Family: More  than three times a week    Frequency of Social Gatherings with Friends and Family: More than three times a week    Attends Religious Services: More than 4 times per year    Active Member of Clubs or Organizations: Yes    Attends Banker Meetings: More than 4 times per year    Marital Status: Separated  Intimate Partner Violence: Not on file  Depression (PHQ2-9): Low Risk (06/02/2024)   Depression (PHQ2-9)    PHQ-2 Score: 0  Alcohol Screen: Not on file  Housing: Low Risk (08/17/2023)   Housing Stability Vital Sign    Unable to Pay for Housing in the  Last Year: No    Number of Times Moved in the Last Year: 0    Homeless in the Last Year: No  Utilities: Not on file  Health Literacy: Not on file    Laboratory Investigations Lab Results  Component Value Date   TSH 0.01 (L) 07/26/2024   TSH 0.036 (L) 02/15/2024   TSH CANCELED 02/11/2024   FREET4 1.4 07/26/2024   FREET4 1.27 08/18/2023   FREET4 1.30 05/14/2023     Lab Results  Component Value Date   TSI 137 07/26/2024     No components found for: TRAB   Lab Results  Component Value Date   CHOL 213 (H) 07/12/2024   Lab Results  Component Value Date   HDL 37 (L) 07/12/2024   Lab Results  Component Value Date   LDLCALC 158 (H) 07/12/2024   Lab Results  Component Value Date   TRIG 100 07/12/2024   Lab Results  Component Value Date   CHOLHDL 5.8 (H) 07/12/2024   Lab Results  Component Value Date   CREATININE 0.68 02/15/2024   No results found for: GFR    Component Value Date/Time   NA 140 02/15/2024 0845   NA 140 07/10/2014 1700   K 4.3 02/15/2024 0845   K 3.8 07/10/2014 1700   CL 106 02/15/2024 0845   CL 106 07/10/2014 1700   CO2 20 02/15/2024 0845   CO2 26 07/10/2014 1700   GLUCOSE 139 (H) 02/15/2024 0845   GLUCOSE 155 (H) 04/06/2021 1650   GLUCOSE 93 07/10/2014 1700   BUN 13 02/15/2024 0845   BUN 8 07/10/2014 1700   CREATININE 0.68 02/15/2024 0845   CREATININE 0.49 (L) 07/10/2014 1700    CREATININE 0.74 03/09/2014 1428   CALCIUM  9.1 02/15/2024 0845   CALCIUM  8.4 (L) 07/10/2014 1700   PROT 7.1 07/12/2024 1124   PROT 8.0 07/10/2014 1700   ALBUMIN 4.2 07/12/2024 1124   ALBUMIN 3.8 07/10/2014 1700   AST 20 07/12/2024 1124   AST 29 07/10/2014 1700   ALT 22 07/12/2024 1124   ALT 25 07/10/2014 1700   ALKPHOS 73 07/12/2024 1124   ALKPHOS 66 07/10/2014 1700   BILITOT 0.6 07/12/2024 1124   BILITOT 0.5 07/10/2014 1700   GFRNONAA >60 04/06/2021 1650   GFRNONAA >60 07/10/2014 1700   GFRNONAA >60 02/24/2014 0131   GFRAA >60 03/22/2018 1201   GFRAA >60 07/10/2014 1700   GFRAA >60 02/24/2014 0131      Latest Ref Rng & Units 02/15/2024    8:45 AM 02/11/2024    2:09 PM 02/26/2023   12:25 PM  BMP  Glucose 70 - 99 mg/dL 860  CANCELED  871   BUN 6 - 24 mg/dL 13  CANCELED  10   Creatinine 0.57 - 1.00 mg/dL 9.31  CANCELED  9.28   BUN/Creat Ratio 9 - 23 19   14    Sodium 134 - 144 mmol/L 140  CANCELED  139   Potassium 3.5 - 5.2 mmol/L 4.3  CANCELED  4.3   Chloride 96 - 106 mmol/L 106  CANCELED  104   CO2 20 - 29 mmol/L 20  CANCELED  20   Calcium  8.7 - 10.2 mg/dL 9.1  CANCELED  9.2        Component Value Date/Time   WBC 4.9 07/26/2024 0947   RBC 4.68 07/26/2024 0947   HGB 13.8 07/26/2024 0947   HGB 12.7 04/30/2023 1221   HCT 41.6 07/26/2024 0947   HCT 38.7 04/30/2023 1221  PLT 256 07/26/2024 0947   PLT 263 04/30/2023 1221   MCV 88.9 07/26/2024 0947   MCV 91 04/30/2023 1221   MCV 88 12/16/2014 1745   MCH 29.5 07/26/2024 0947   MCHC 33.2 07/26/2024 0947   RDW 11.6 07/26/2024 0947   RDW 12.0 04/30/2023 1221   RDW 13.0 12/16/2014 1745   LYMPHSABS 2.5 07/03/2021 1215   LYMPHSABS 4.3 (H) 07/10/2014 1700   MONOABS 0.3 04/08/2015 1500   MONOABS 0.6 07/10/2014 1700   EOSABS 162 07/26/2024 0947   EOSABS 0.1 07/03/2021 1215   EOSABS 0.3 07/10/2014 1700   BASOSABS 69 07/26/2024 0947   BASOSABS 0.1 07/03/2021 1215   BASOSABS 0.1 07/10/2014 1700      Parts of this note  may have been dictated using voice recognition software. There may be variances in spelling and vocabulary which are unintentional. Not all errors are proofread. Please notify the dino if any discrepancies are noted or if the meaning of any statement is not clear.    "

## 2024-09-15 NOTE — Progress Notes (Unsigned)
 Patient ID: Haley Mclaughlin                 DOB: [birth year]                    MRN: [id number]      HPI: Haley Mclaughlin is a 59 y.o. female patient referred to lipid clinic by Lonni Meager, NP. PMH is significant for HTN, HLD, T2DM, obesity, and atherosclerosis of aorta.   At the most recent visit with Lonni Meager on 07/12/24, he discussed the importance of aggressive lipid management with the patient. In 02/2024, the patient was on atorvastatin  80 mg daily and her LDL-C remained elevated at 158 mg/dL. Ezetimibe  10 mg daily was initiated at that time. On the follow-up lipid panel on 07/12/24, LDL-C was still elevated at 158 mg/dL.   At today's visit, ***  Ensure taking ezetimibe  Any missed doses between June and November?  Repatha  or Praluent to lower LDL-C below goal indicated Medicaid patient  Reviewed options for lowering LDL cholesterol, including ezetimibe , PCSK-9 inhibitors, bempedoic acid  and inclisiran.  Discussed mechanisms of action, dosing, side effects and potential decreases in LDL cholesterol.  Also reviewed cost information and potential options for patient assistance.   Current Medications:  Atorvastatin  80 mg daily Ezetimibe  10 mg daily LF 05/22/24 for 90DS -- check again once arrived *** Intolerances: N/A Risk Factors: T2DM, HTN, atherosclerosis of aorta LDL-C goal: <70 mg/dL  Diet: ***  Exercise: ***  Family History:  Family History  Problem Relation Age of Onset   Heart disease Mother    Hypertension Mother    Diabetes Father    Heart disease Father    Hypertension Sister    Anesthesia problems Neg Hx    Social History:  Tobacco: never *** EtOH: ***  Labs: Lipid Panel     Component Value Date/Time   CHOL 213 (H) 07/12/2024 1124   TRIG 100 07/12/2024 1124   HDL 37 (L) 07/12/2024 1124   CHOLHDL 5.8 (H) 07/12/2024 1124   CHOLHDL 9.9 01/09/2015 1040   VLDL 28 01/09/2015 1040   LDLCALC 158 (H) 07/12/2024 1124   LABVLDL 18 07/12/2024  1124   Past Medical History:  Diagnosis Date   Abnormal Pap smear    Atherosclerosis of aorta 07/21/2021   Dyspnea on exertion    a. 07/2021 Echo: EF of 60 to 65% without regional wall motion abnormalities, mild LVH, and trivial MR; b. 08/2021 ETT: Ex time 86m 26s. No ST/T changes ->low risk.   Gestational diabetes    diet controlled with #3   Hyperlipemia    Hyperlipidemia    Hypertension    PID (pelvic inflammatory disease)    Preterm labor    with last 4 preg, del at term   PSVT (paroxysmal supraventricular tachycardia)    a. 01/2023 Zio: predominantly sinus rhythm at 84 bpm with 3 brief runs of SVT (up to 14 beats at 158 bpm) and rare PACs/PVCs.  Triggered events were associated with SVT.   Sarcoidosis    Urinary tract infection    Medications Ordered Prior to Encounter[1]  Allergies[2]  Assessment/Plan:  1. Hyperlipidemia -  No problem-specific Assessment & Plan notes found for this encounter.    Thank you, ***  Melissa D Maccia, Pharm.Haley Mclaughlin, CPP [facility] [address], [place], [place] [address]  Phone: [phone]; Fax: [phone]     [1]  Current Outpatient Medications on  File Prior to Visit  Medication Sig Dispense Refill   acetaminophen  (TYLENOL ) 325 MG tablet Take 650 mg by mouth every 6 (six) hours as needed.     atorvastatin  (LIPITOR) 80 MG tablet TAKE 1 TABLET BY MOUTH EVERY DAY 90 tablet 3   carvedilol  (COREG ) 3.125 MG tablet Take 1 tablet (3.125 mg total) by mouth 2 (two) times daily. (Patient not taking: Reported on 07/12/2024) 180 tablet 3   ezetimibe  (ZETIA ) 10 MG tablet Take 1 tablet (10 mg total) by mouth daily. 90 tablet 3   gabapentin  (NEURONTIN ) 100 MG capsule Take 1 capsule (100 mg total) by mouth 2 (two) times daily. (Patient not taking: Reported on 07/12/2024) 90 capsule 0   hydrocortisone  2.5 % cream Apply topically 2 (two) times daily. 30 g 2   losartan  (COZAAR ) 100 MG tablet  TAKE 1 TABLET BY MOUTH EVERY DAY 90 tablet 2   methimazole  (TAPAZOLE ) 5 MG tablet Take 1.5 tablets (7.5 mg total) by mouth daily. 135 tablet 0   Multiple Vitamins-Minerals (MULTIVITAMIN ADULTS 50+) TABS Take 1 tablet by mouth daily.     omeprazole  (PRILOSEC) 40 MG capsule TAKE 1 CAPSULE BY MOUTH EVERY DAY AS NEEDED 90 capsule 0   No current facility-administered medications on file prior to visit.  [2]  Allergies Allergen Reactions   Sulfamethoxazole-Trimethoprim Rash and Other (See Comments)    Feels shaky also Feels shaky Other reaction(s): Other (See Comments) Feels shaky Feels shaky also Feels shaky Other reaction(s): Other (See Comments) Feels shaky Feels shaky also Feels shaky Other reaction(s): Other (See Comments) Feels shaky Feels shaky Other reaction(s): Other (See Comments) Feels shaky Feels shaky also

## 2024-09-19 ENCOUNTER — Ambulatory Visit: Admitting: Pharmacist
